# Patient Record
Sex: Female | Born: 1950 | Race: White | Hispanic: No | Marital: Married | Smoking: Never smoker
Health system: Southern US, Community
[De-identification: ages and names within clinical notes are randomized; demographics above are authoritative.]

## PROBLEM LIST (undated history)

## (undated) DIAGNOSIS — Z8489 Family history of other specified conditions: Secondary | ICD-10-CM

## (undated) DIAGNOSIS — F32A Depression, unspecified: Secondary | ICD-10-CM

## (undated) DIAGNOSIS — F329 Major depressive disorder, single episode, unspecified: Secondary | ICD-10-CM

## (undated) DIAGNOSIS — E039 Hypothyroidism, unspecified: Secondary | ICD-10-CM

## (undated) DIAGNOSIS — R0789 Other chest pain: Secondary | ICD-10-CM

## (undated) DIAGNOSIS — F319 Bipolar disorder, unspecified: Secondary | ICD-10-CM

## (undated) DIAGNOSIS — J45990 Exercise induced bronchospasm: Secondary | ICD-10-CM

## (undated) DIAGNOSIS — J189 Pneumonia, unspecified organism: Secondary | ICD-10-CM

## (undated) HISTORY — DX: Other chest pain: R07.89

## (undated) HISTORY — PX: ABDOMINAL HYSTERECTOMY: SHX81

## (undated) HISTORY — PX: BREAST LUMPECTOMY: SHX2

---

## 2000-02-09 ENCOUNTER — Encounter: Admission: RE | Admit: 2000-02-09 | Discharge: 2000-02-09 | Payer: Self-pay | Admitting: Family Medicine

## 2000-02-09 ENCOUNTER — Encounter: Payer: Self-pay | Admitting: Family Medicine

## 2000-02-19 ENCOUNTER — Ambulatory Visit (HOSPITAL_COMMUNITY): Admission: RE | Admit: 2000-02-19 | Discharge: 2000-02-19 | Payer: Self-pay | Admitting: *Deleted

## 2000-10-04 ENCOUNTER — Encounter: Admission: RE | Admit: 2000-10-04 | Discharge: 2000-10-04 | Payer: Self-pay | Admitting: Family Medicine

## 2000-10-25 ENCOUNTER — Encounter: Admission: RE | Admit: 2000-10-25 | Discharge: 2000-10-25 | Payer: Self-pay | Admitting: Family Medicine

## 2001-01-07 ENCOUNTER — Inpatient Hospital Stay (HOSPITAL_COMMUNITY): Admission: EM | Admit: 2001-01-07 | Discharge: 2001-01-10 | Payer: Self-pay | Admitting: Psychiatry

## 2001-07-21 ENCOUNTER — Encounter: Payer: Self-pay | Admitting: Family Medicine

## 2001-07-21 ENCOUNTER — Encounter: Admission: RE | Admit: 2001-07-21 | Discharge: 2001-07-21 | Payer: Self-pay | Admitting: Family Medicine

## 2001-08-05 ENCOUNTER — Encounter: Payer: Self-pay | Admitting: Emergency Medicine

## 2001-08-05 ENCOUNTER — Emergency Department (HOSPITAL_COMMUNITY): Admission: EM | Admit: 2001-08-05 | Discharge: 2001-08-05 | Payer: Self-pay | Admitting: Emergency Medicine

## 2002-02-07 ENCOUNTER — Emergency Department (HOSPITAL_COMMUNITY): Admission: EM | Admit: 2002-02-07 | Discharge: 2002-02-07 | Payer: Self-pay | Admitting: *Deleted

## 2002-10-26 ENCOUNTER — Encounter: Payer: Self-pay | Admitting: Family Medicine

## 2002-10-26 ENCOUNTER — Encounter: Admission: RE | Admit: 2002-10-26 | Discharge: 2002-10-26 | Payer: Self-pay | Admitting: Family Medicine

## 2003-04-02 ENCOUNTER — Other Ambulatory Visit (HOSPITAL_COMMUNITY): Admission: RE | Admit: 2003-04-02 | Discharge: 2003-04-11 | Payer: Self-pay | Admitting: Psychiatry

## 2003-04-25 ENCOUNTER — Other Ambulatory Visit (HOSPITAL_COMMUNITY): Admission: RE | Admit: 2003-04-25 | Discharge: 2003-04-27 | Payer: Self-pay | Admitting: Psychiatry

## 2003-11-08 ENCOUNTER — Encounter: Admission: RE | Admit: 2003-11-08 | Discharge: 2003-11-08 | Payer: Self-pay | Admitting: Family Medicine

## 2003-11-15 ENCOUNTER — Encounter: Admission: RE | Admit: 2003-11-15 | Discharge: 2003-11-15 | Payer: Self-pay | Admitting: Family Medicine

## 2004-06-02 ENCOUNTER — Encounter: Admission: RE | Admit: 2004-06-02 | Discharge: 2004-06-02 | Payer: Self-pay | Admitting: Family Medicine

## 2005-01-07 ENCOUNTER — Encounter: Admission: RE | Admit: 2005-01-07 | Discharge: 2005-01-07 | Payer: Self-pay | Admitting: Family Medicine

## 2005-12-09 ENCOUNTER — Encounter: Admission: RE | Admit: 2005-12-09 | Discharge: 2005-12-09 | Payer: Self-pay | Admitting: Family Medicine

## 2006-01-12 HISTORY — PX: BREAST EXCISIONAL BIOPSY: SUR124

## 2006-05-26 ENCOUNTER — Encounter: Admission: RE | Admit: 2006-05-26 | Discharge: 2006-05-26 | Payer: Self-pay | Admitting: Family Medicine

## 2006-05-28 ENCOUNTER — Encounter: Admission: RE | Admit: 2006-05-28 | Discharge: 2006-05-28 | Payer: Self-pay | Admitting: Family Medicine

## 2006-06-16 ENCOUNTER — Encounter: Admission: RE | Admit: 2006-06-16 | Discharge: 2006-06-16 | Payer: Self-pay | Admitting: Family Medicine

## 2006-07-15 ENCOUNTER — Ambulatory Visit (HOSPITAL_BASED_OUTPATIENT_CLINIC_OR_DEPARTMENT_OTHER): Admission: RE | Admit: 2006-07-15 | Discharge: 2006-07-15 | Payer: Self-pay | Admitting: General Surgery

## 2006-07-15 ENCOUNTER — Encounter (INDEPENDENT_AMBULATORY_CARE_PROVIDER_SITE_OTHER): Payer: Self-pay | Admitting: General Surgery

## 2006-07-15 ENCOUNTER — Encounter: Admission: RE | Admit: 2006-07-15 | Discharge: 2006-07-15 | Payer: Self-pay | Admitting: General Surgery

## 2007-06-01 ENCOUNTER — Encounter: Admission: RE | Admit: 2007-06-01 | Discharge: 2007-06-01 | Payer: Self-pay | Admitting: Family Medicine

## 2008-06-06 ENCOUNTER — Encounter: Admission: RE | Admit: 2008-06-06 | Discharge: 2008-06-06 | Payer: Self-pay | Admitting: Family Medicine

## 2009-06-26 ENCOUNTER — Encounter: Admission: RE | Admit: 2009-06-26 | Discharge: 2009-06-26 | Payer: Self-pay | Admitting: Family Medicine

## 2009-10-23 ENCOUNTER — Encounter: Admission: RE | Admit: 2009-10-23 | Discharge: 2009-10-23 | Payer: Self-pay | Admitting: Family Medicine

## 2010-05-27 NOTE — Op Note (Signed)
NAMEALIS, SAWCHUK                 ACCOUNT NO.:  [id number]   MEDICAL RECORD NO.:  [id number]          PATIENT TYPE:  AMB   LOCATION:  DSC                          FACILITY:  [facility]   PHYSICIAN:  Gabrielle Dare. Janee Morn, M.D.DATE OF BIRTH:  05/05/1950   DATE OF PROCEDURE:  07/15/2006  DATE OF DISCHARGE:                               OPERATIVE REPORT   PREOPERATIVE DIAGNOSIS:  Calcifications, left breast.   POSTOPERATIVE DIAGNOSIS:  Calcifications, left breast.   PROCEDURE:  Needle localized left breast biopsy.   SURGEON:  Gabrielle Dare. Janee Morn, M.D.   ANESTHESIA:  General with laryngeal mask airway   HISTORY OF PRESENT ILLNESS:  Ms. Bacorn is a 60 year old female who was  noted on mammogram to have some suspicious calcifications in her left  breast. Stereotactic biopsy was done on June 16, 2006.  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  Due to the concerning appearance and these path  report findings, however, a biopsy of the area was recommended.  The  patient [facility]   PROCEDURE IN DETAIL:  Informed consent was obtained. The patient's site  was marked.  She received intravenous antibiotics.  She was brought to  the operating room.  General anesthesia with laryngeal mask airway was  administered by the anesthesia staff. Her left breast was prepped and  draped in a sterile fashion.  A curved incision was made including the  needle after injection of 0.25% Marcaine with epinephrine.  Subcutaneous  tissues were dissected down.  We followed the path of the needle and  cored out this suspicious area of tissue. Bovie cautery was used to get  excellent hemostasis along the way. The area was circumferentially  dissected and removed. This area was taken down to the fascia.  The deep  margin of the specimen was then oriented for pathology and sent for  specimen  mammography.  The wound was copiously irrigated.  Meticulous  hemostasis was ensured.  No other palpable abnormalities were noted.  Specimen mammogram confirmed that the specimen contained the suspicious  area. The subcutaneous tissues were approximated with a running 3-0  Vicryl suture and the skin was closed with a  running 4-0 Monocryl subcuticular stitch.  Sponge, needle and instrument  counts were all correct.  Benzoin, Steri-Strips and sterile dressings  were applied.  The patient tolerated the procedure well without apparent  complication and was taken to recovery in stable condition.      Gabrielle Dare Janee Morn, M.D.  Electronically Signed     BET/MEDQ  D:  07/15/2006  T:  07/15/2006  Job:  [id number]   cc:   Marjory Lies, M.D.  Dr. Manson Passey

## 2010-05-30 NOTE — H&P (Signed)
[facility]  Patient:    Shelley Clarke, Shelley Clarke Visit Number: [id number] MRN: [id number]          Service Type: PSY Location: 300 0301 01 Attending Physician:  Jeanice Lim Dictated by:   Young Berry Scott, R.N. N.P. Admit Date:  01/07/2001 Discharge Date: 01/10/2001                     Psychiatric Admission Assessment  DATE OF ADMISSION:  January 07, 2001  DATE OF ASSESSMENT:  January 08, 2001, at 8:30 a.m.  PATIENT IDENTIFICATION:  This is a 60 year old Caucasian female who is a voluntary admission.  HISTORY OF PRESENT ILLNESS:  This patient was referred by her psychiatrist at his office after expressing some suicidal thoughts, thinking that she might want to overdose on medications without any specific intent.  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  She endorses feelings of hopelessness and helplessness and has been experiencing increasing irritability.  The patient states during the assessment, "Im really feeling like I dont want to live."  She does also endorse suicidal ideation for the past two days.  She has been on unemployment and her spouse encouraged her to take an old position that paid less money than her previous one which contributed to her depression.  Also, she feels that the holiday season has been hard on her and that she "feels worthless." The new job that she is going to take involves taking a pay cut down to one half of what she was previously making.  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  PAST PSYCHIATRIC HISTORY:  The patient is followed by Valinda Hoar, the nurse practitioner with [facility] and has been followed  there since October 2002.  She has no prior inpatient admissions.  This is her first psychiatric admission and her first [facility] admission.  She reports that she has taken in the past Zoloft, Prozac, and Effexor to treat her depression and most recently has been started on Lexapro.  She denies any history of true mania but admits that she does have considerable mood fluctuation.  SUBSTANCE ABUSE HISTORY:  The patients urine drug screen was negative and she denies any substance abuse.  PAST MEDICAL HISTORY:  The patients primary care Sherika Kubicki is [facility].  Medical problems include hypothyroidism, GERD, hyperlipidemia, mitral valve prolapse secondary to using fen-phen many years ago, and she has a history of Graves disease that was diagnosed in February 2002 for which she received treatment.  CURRENT MEDICATIONS: 1. Synthroid 100 mcg daily. 2. Lexapro 10 mg daily. 3. Klonopin 0.5 mg one half to one tablet on a p.r.n. basis; the patient    typically takes one half tablet in the morning and at night, according to    her report. 4. Depakote ER 500 mg p.o. q.h.s. 5. Protonix 40 mg q.d. p.r.n. for her GERD. 6. Tricor 160 mg p.o. q.h.s.  DRUG ALLERGIES:  ZYPREXA which causes swelling and BENADRYL which causes formication.  REVIEW OF SYSTEMS:  Essentially unremarkable.  She does report that she has rare palpitations with her mitral valve regurgitation secondary to her fen-phen but has not had any of these recently.  NEUROLOGIC: The patient does report some recent decreased concentration with mild impairment of memory that she just attributes to her depression.  She has had a history of occasional headaches but none recently.  She does have a history of hypothyroidism secondary to receiving radiation for her Graves disease.  The patient reports that he reflux does bother her if she does not take her medications.  She also suffers from occasional constipation.  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 basis when she has recurrent flareups.  PHYSICAL EXAMINATION:  GENERAL:  The patient is a generally healthy appearing Caucasian female who is in no acute distress at this time.  She is overweight but generally healthy in appearance.  VITAL SIGNS:  On admission to the unit, temperature 98.1, pulse 65, respirations 20, blood pressure 147/88.  The patient weighed 222 pounds at the time of admission.  We do not have a height on her but she appears to be approximately 5 feet 4 inches tall, possibly 5 feet 6 inches tall.  HEAD:  Normocephalic.  Grooming is appropriate.  SKIN:  Medium tone, no remarkable features.  EENT:  PERRLA.  No rhinorrhea.  Oropharynx: Unremarkable.  NECK:  Supple without thyromegaly and full range of motion.  CARDIOVASCULAR:  S1 and S2 heard, no murmurs appreciated.  LUNGS:  Clear to auscultation.  ABDOMEN:  Rounded, soft and quiet, no tenderness.  No masses noted.  GENITALIA:  Deferred.  BREAST:  Exam is deferred.  MUSCULOSKELETAL:   Gait is grossly normal.  Strength is 5/5 throughout.  No restrictions in range of motion are obvious.  NEUROLOGIC:  Cranial nerves II-XII are intact.  EOMs: Intact.  Romberg: Without findings.  No focal neurologic findings noted.  LABORATORY DATA:  Depakote and thyroid panels are currently pending.  CBC was within normal limits.  Electrolytes were within normal limits.  Glucose was mildly elevated at 119 though she denies any history of diabetes or glucose intolerance that she is aware of.  Lipase was 14, amylase 41.  BUN 9, creatinine 1.0.  Urine drug screen was negative.  No pregnancy test was done because the patient has a history of a hysterectomy in 1985.  SOCIAL HISTORY:  The patient is married and lives at home with her husband and son.  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 over the holiday.  FAMILY HISTORY:  Not available.  MENTAL STATUS EXAMINATION:  This is fully alert female who is full contact with reality.  She does have a tearful affect and spends the entire interview sobbing and crying constantly; however, she is cooperative and polite.  Affect is tearful.  Speech is normal and relevant.  Mood is depressed, hopeless, and helpless.  Thought process is logical and goal directed.  Although she does contemplate suicidal thoughts and still continues to entertain thoughts of possibly overdosing on medications and wishing that she could die, she is able to promise safety at this time on the unit; she is not able to promise safety in the community.  No evidence of homicidal ideation or any paranoia.  No evidence of guarding, no psychosis.  Cognitive: Intact and oriented x 4. Intelligence is above average.  Judgment is fair.  Insight: Fairly good. Impulse control: Within normal limits.  ADMISSION  DIAGNOSES: Axis I:    Rule out bipolar disorder currently depressed. Axis II:   Deferred. Axis III:  1. Mitral valve prolapse.            2. Hypothyroidism.            3. Gastroesophageal reflux disease.            4. Hyperlipidemia.            5. Status post Graves disease by history. Axis IV:   Moderate problems with decreased income and loss of self-esteem by            way of loss of previous job. Axis V:    Current 30, past year 46.  INITIAL PLAN OF CARE:  Plan is to voluntarily admit the patient to stabilize her mood with q.67m. checks.  Goal is to alleviate her suicidal ideation.  At this point, we will continue her previous routine medications and plan on adding Wellbutrin SR 100 mg p.o. today and her Depakote level is currently pending.  Meanwhile, we have discussed her strengths and weaknesses related to these medications and she is agreeable to  trying the Wellbutrin.  She has no history of seizure disorder.  We will continue her with her Lexapro 10 mg p.o. q.d. and we will start her on intensive group and individual psychotherapy and consider the possibility of a family session with her husband if she feels that would helpful after she makes some progress with therapy.  ESTIMATED LENGTH OF STAY:  Three days. Dictated by:   Young Berry Scott, R.N. N.P. Attending Physician:  Jeanice Lim DD:  03/23/01 TD:  03/25/01 Job: [id number]

## 2010-05-30 NOTE — Discharge Summary (Signed)
[facility]  Patient:    Shelley Clarke, Shelley Clarke Visit Number: [id number] MRN: [id number]          Service Type: PSY Location: 300 0301 01 Attending Physician:  Jeanice Lim Dictated by:   Jeanice Lim, M.D. Admit Date:  01/07/2001 Discharge Date: 01/10/2001                             Discharge Summary  IDENTIFYING DATA:  This is a 60 year old Caucasian female voluntarily admitted by her psychiatrist after expressing suicidal thoughts.  The patient has been followed up by Dr. Nolen Mu.  MEDICATIONS:  Synthroid, Lexapro 10 mg q.a.m., Klonopin 0.5 mg 1 tab t.i.d. p.r.n., Depakote ER 500 mg q.h.s., Protonix 40 mg p.r.n. and Tricor 160 mg q.h.s.  ALLERGIES:  ZYPREXA and BENADRYL.  PHYSICAL EXAMINATION:  Unremarkable.  Neurologically nonfocal.  LABORATORY DATA:  Routine admission labs were essentially within normal limits including a negative urinalysis, negative urine drug screen.  CBC with differential and comprehensive metabolic panel within normal limits except for a slightly elevated glucose at 119.  Amylase and lipase were within normal limits.  Thyroid panel within normal limits.  HOSPITAL COURSE:  The patient rapidly reported improvement after being admitted.  There is no indication to make medication changes.  She had been fired from a job secondary to temper tantrums.  Had a history of bipolar and dysthymic disorder and denied suicidal ideation after being admitted, reporting feeling less depressed and less overwhelmed after being hospitalized.  Synthroid, Lexapro, Klonopin, Depakote, Tricor, Protonix were all resumed.  The patient tolerated these medications well without side effects.  Wellbutrin SR 100 mg was started for adjunctive treatment of depressive symptoms.  Family was contacted as well as outpatient psychiatrist for aftercare planning as well as request to send a copy of the labs including liver function tests and valproic acid  level to Dr. Nolen Mu was also made.  CONDITION ON DISCHARGE:  Markedly improved.  Mood was euthymic.  Affect brighter.  Thought process goal directed.  Thought content negative for suicidal or homicidal ideation or psychotic symptoms.  Judgment and insight had improved.  The patient felt she could cope with being treated as an outpatient safely.  DISCHARGE MEDICATIONS: 1. Wellbutrin SR 100 mg q.a.m. 2. Levothyroxine 100 mcg q.a.m. 3. Lexapro 10 mg q.a.m. 4. Klonopin 0.5 mg, 1/2 b.i.d. and 1/2 q.h.s. p.r.n. insomnia and anxiety. 5. Depakote 500 mg q.h.s. 6. Tricor 160 mg q.h.s. 7. Protonix. 8. Ambien p.r.n. to take as previously prescribed.  DISCHARGE RECOMMENDATIONS:  The patient was recommended a heart-healthy diet.  FOLLOW-UP:  Again, a copy of the labs were requested to be sent to Dr. Nolen Mu and patient was scheduled for follow-up with Valinda Hoar on January 13, 2001 at 1 p.m.  DISCHARGE DIAGNOSES:  Same as admitting except Global Assessment of Functioning on discharge was 55. Dictated by:   Jeanice Lim, M.D. Attending Physician:  Jeanice Lim DD:  02/08/01 TD:  02/09/01 Job: [id number]

## 2010-07-15 ENCOUNTER — Other Ambulatory Visit: Payer: Self-pay | Admitting: Family Medicine

## 2010-07-15 DIAGNOSIS — Z1231 Encounter for screening mammogram for malignant neoplasm of breast: Secondary | ICD-10-CM

## 2010-07-23 ENCOUNTER — Ambulatory Visit: Payer: Self-pay

## 2010-07-30 ENCOUNTER — Ambulatory Visit
Admission: RE | Admit: 2010-07-30 | Discharge: 2010-07-30 | Disposition: A | Discharge status: Home / Self Care | Payer: 59 | Source: Ambulatory Visit | Attending: Family Medicine | Admitting: Family Medicine

## 2010-07-30 DIAGNOSIS — Z1231 Encounter for screening mammogram for malignant neoplasm of breast: Secondary | ICD-10-CM

## 2010-10-28 LAB — POCT HEMOGLOBIN-HEMACUE
Hemoglobin: 14.7
Operator id: 12362

## 2011-06-24 ENCOUNTER — Other Ambulatory Visit: Payer: Self-pay | Admitting: Family Medicine

## 2011-06-24 DIAGNOSIS — Z1231 Encounter for screening mammogram for malignant neoplasm of breast: Secondary | ICD-10-CM

## 2011-08-05 ENCOUNTER — Ambulatory Visit: Payer: 59

## 2011-08-19 ENCOUNTER — Ambulatory Visit
Admission: RE | Admit: 2011-08-19 | Discharge: 2011-08-19 | Disposition: A | Discharge status: Home / Self Care | Payer: 59 | Source: Ambulatory Visit | Attending: Family Medicine | Admitting: Family Medicine

## 2011-08-19 DIAGNOSIS — Z1231 Encounter for screening mammogram for malignant neoplasm of breast: Secondary | ICD-10-CM

## 2011-09-09 ENCOUNTER — Ambulatory Visit: Payer: 59

## 2012-08-29 ENCOUNTER — Other Ambulatory Visit: Payer: Self-pay

## 2012-08-29 DIAGNOSIS — Z1231 Encounter for screening mammogram for malignant neoplasm of breast: Secondary | ICD-10-CM

## 2012-09-19 ENCOUNTER — Ambulatory Visit: Payer: 59

## 2012-10-07 ENCOUNTER — Ambulatory Visit: Payer: 59

## 2012-10-28 ENCOUNTER — Ambulatory Visit
Admission: RE | Admit: 2012-10-28 | Discharge: 2012-10-28 | Disposition: A | Discharge status: Home / Self Care | Payer: 59 | Source: Ambulatory Visit

## 2012-10-28 DIAGNOSIS — Z1231 Encounter for screening mammogram for malignant neoplasm of breast: Secondary | ICD-10-CM

## 2013-10-16 ENCOUNTER — Other Ambulatory Visit: Payer: Self-pay

## 2013-10-16 DIAGNOSIS — Z1239 Encounter for other screening for malignant neoplasm of breast: Secondary | ICD-10-CM

## 2013-11-09 ENCOUNTER — Ambulatory Visit
Admission: RE | Admit: 2013-11-09 | Discharge: 2013-11-09 | Disposition: A | Discharge status: Home / Self Care | Payer: 59 | Source: Ambulatory Visit

## 2013-11-09 DIAGNOSIS — Z1239 Encounter for other screening for malignant neoplasm of breast: Secondary | ICD-10-CM

## 2014-10-31 ENCOUNTER — Other Ambulatory Visit: Payer: Self-pay

## 2014-10-31 DIAGNOSIS — Z1231 Encounter for screening mammogram for malignant neoplasm of breast: Secondary | ICD-10-CM

## 2014-12-12 ENCOUNTER — Ambulatory Visit
Admission: RE | Admit: 2014-12-12 | Discharge: 2014-12-12 | Disposition: A | Discharge status: Home / Self Care | Payer: 59 | Source: Ambulatory Visit

## 2014-12-12 DIAGNOSIS — Z1231 Encounter for screening mammogram for malignant neoplasm of breast: Secondary | ICD-10-CM

## 2015-04-15 DIAGNOSIS — E039 Hypothyroidism, unspecified: Secondary | ICD-10-CM | POA: Insufficient documentation

## 2015-04-15 DIAGNOSIS — F419 Anxiety disorder, unspecified: Secondary | ICD-10-CM | POA: Insufficient documentation

## 2015-04-15 DIAGNOSIS — F329 Major depressive disorder, single episode, unspecified: Secondary | ICD-10-CM | POA: Insufficient documentation

## 2015-04-15 DIAGNOSIS — F32A Depression, unspecified: Secondary | ICD-10-CM | POA: Insufficient documentation

## 2015-11-11 ENCOUNTER — Other Ambulatory Visit: Payer: Self-pay | Admitting: Family Medicine

## 2015-11-11 DIAGNOSIS — Z1231 Encounter for screening mammogram for malignant neoplasm of breast: Secondary | ICD-10-CM

## 2015-12-23 ENCOUNTER — Ambulatory Visit
Admission: RE | Admit: 2015-12-23 | Discharge: 2015-12-23 | Disposition: A | Discharge status: Home / Self Care | Payer: 59 | Source: Ambulatory Visit | Attending: Family Medicine | Admitting: Family Medicine

## 2015-12-23 DIAGNOSIS — Z1231 Encounter for screening mammogram for malignant neoplasm of breast: Secondary | ICD-10-CM

## 2016-04-20 ENCOUNTER — Other Ambulatory Visit: Payer: Self-pay | Admitting: Family Medicine

## 2016-04-20 DIAGNOSIS — E2839 Other primary ovarian failure: Secondary | ICD-10-CM

## 2016-05-04 ENCOUNTER — Ambulatory Visit
Admission: RE | Admit: 2016-05-04 | Discharge: 2016-05-04 | Disposition: A | Discharge status: Home / Self Care | Payer: 59 | Source: Ambulatory Visit | Attending: Family Medicine | Admitting: Family Medicine

## 2016-05-04 DIAGNOSIS — E2839 Other primary ovarian failure: Secondary | ICD-10-CM

## 2016-11-25 ENCOUNTER — Other Ambulatory Visit: Payer: Self-pay | Admitting: Family Medicine

## 2016-11-25 DIAGNOSIS — Z1231 Encounter for screening mammogram for malignant neoplasm of breast: Secondary | ICD-10-CM

## 2016-12-23 ENCOUNTER — Ambulatory Visit
Admission: RE | Admit: 2016-12-23 | Discharge: 2016-12-23 | Disposition: A | Discharge status: Home / Self Care | Payer: 59 | Source: Ambulatory Visit | Attending: Family Medicine | Admitting: Family Medicine

## 2016-12-23 DIAGNOSIS — Z1231 Encounter for screening mammogram for malignant neoplasm of breast: Secondary | ICD-10-CM

## 2017-09-24 ENCOUNTER — Other Ambulatory Visit: Payer: Self-pay | Admitting: Family Medicine

## 2017-09-24 DIAGNOSIS — R2689 Other abnormalities of gait and mobility: Secondary | ICD-10-CM

## 2017-09-24 DIAGNOSIS — R251 Tremor, unspecified: Secondary | ICD-10-CM

## 2017-09-24 DIAGNOSIS — R296 Repeated falls: Secondary | ICD-10-CM

## 2017-10-13 ENCOUNTER — Encounter: Payer: Self-pay | Admitting: Physician Assistant

## 2017-10-13 ENCOUNTER — Ambulatory Visit: Payer: 59 | Admitting: Physician Assistant

## 2017-10-13 DIAGNOSIS — F319 Bipolar disorder, unspecified: Secondary | ICD-10-CM | POA: Diagnosis not present

## 2017-10-13 DIAGNOSIS — F429 Obsessive-compulsive disorder, unspecified: Secondary | ICD-10-CM

## 2017-10-13 DIAGNOSIS — F411 Generalized anxiety disorder: Secondary | ICD-10-CM

## 2017-10-13 MED ORDER — FLUOXETINE HCL 40 MG PO CAPS: ORAL_CAPSULE | ORAL | 3 refills | Status: DC

## 2017-10-13 NOTE — Progress Notes (Signed)
[facility]  Patient ID: Shelley Clarke,  MRN: [id number]  PCP: Richmond Campbell., PA-C  Date of Evaluation: 10/13/2017 Time spent:15 minutes   HISTORY/CURRENT STATUS: HPI  "I'm doing really good right now!  Increasing the Prozac has helped a lot." It's been about 6 weeks now.  Able to enjoy things: cross-stitching and going to gym 6 days a week and loving that.  She's cooking a lot too.  Energy and motivation are good.  Not crying easily.  Sleeps fairly well.  "For the most part.  But I wake up about 4 a.m. But can go back to sleep."  Feels rested when she gets up.  No increased energy, impulsivity, risky behavior, irritability, increased spending or libido, or other manic sx. She reports having balance problems.  She has had work-up including MRI that was normal, through her PCP.  She will be starting physical therapy for that issue in a couple of weeks.  She denies tremor. She and her husband are going on vacation to [facility] next week and she is looking forward to that.  Individual Medical History/ Review of Systems: Changes? :No  Allergies: Olanzapine  Current Medications:  Current Outpatient Medications:  .  aspirin EC 81 MG tablet, Take 81 mg by mouth., Disp: , Rfl:  .  buPROPion (WELLBUTRIN XL) 150 MG 24 hr tablet, Take 150 mg by mouth., Disp: , Rfl:  .  buPROPion (WELLBUTRIN XL) 300 MG 24 hr tablet, Take 300 mg by mouth., Disp: , Rfl:  .  clonazePAM (KLONOPIN) 0.5 MG tablet, TAKE 1 TABLET BY MOUTH EVERY 6 HOURS AS NEEDED AND 2 TABLETS AT BEDTIME, Disp: , Rfl:  .  FLUoxetine (PROZAC) 40 MG capsule, 40 mg., Disp: , Rfl:  .  FLUoxetine (PROZAC) 40 MG capsule, Take 2 every morning, Disp: 180 capsule, Rfl: 3 .  lamoTRIgine (LAMICTAL) 150 MG tablet, 150 mg., Disp: , Rfl:  .  ziprasidone (GEODON) 40 MG capsule, TAKE 1 TABLET AT BREAKFAST AND 1 TABLET AT LUNCH, Disp: , Rfl:  .  ziprasidone (GEODON) 80 MG capsule, Take 80 mg by mouth., Disp: , Rfl:  Medication Side  Effects: None  Family Medical/ Social History: Changes? No  MENTAL HEALTH EXAM:  There were no vitals taken for this visit.There is no height or weight on file to calculate BMI.  General Appearance: Well Groomed  Eye Contact:  Good  Speech:  Clear and Coherent  Volume:  Normal  Mood:  Euthymic  Affect:  Appropriate  Thought Process:  Goal Directed  Orientation:  Full (Time, Place, and Person)  Thought Content: Logical   Suicidal Thoughts:  No  Homicidal Thoughts:  No  Memory:  Immediate  Judgement:  Good  Insight:  Good  Psychomotor Activity:  slightly leaning to the right.  Swings her arms normally.  No shuffling gate.  Concentration:  Concentration: Good  Recall:  Good  Fund of Knowledge: Good  Language: Good  Akathisia:  No  AIMS (if indicated): not done  Assets:  Communication Skills  ADL's:  Intact  Cognition: WNL  Prognosis:  Good    DIAGNOSES:    ICD-10-CM   1. Bipolar I disorder (HCC) F31.9   2. Generalized anxiety disorder F41.1   3. Obsessive-compulsive disorder, unspecified type F42.9     RECOMMENDATIONS: We discussed the imbalance issues.  She is aware that some of her medications may be causing the issue.  She is doing so well mentally that neither of [place] want to  change any medications.  We agreed to see how well she does with physical therapy.  If she does not improve then we may need to consider changing, or at least decreasing the doses of the mental health medications.   She will continue all medications as above.  Her new prescription for Prozac will be for 40 mg to daily instead of the 60+20 that she has been taking.  She understands the change in dose. Continue therapeutic lifestyle with exercising daily. Return in approximately 3 months or sooner as needed.    Melony Overly, PA-C

## 2017-11-02 ENCOUNTER — Emergency Department (HOSPITAL_COMMUNITY): Payer: Medicare Other

## 2017-11-02 ENCOUNTER — Inpatient Hospital Stay (HOSPITAL_COMMUNITY)
Admission: EM | Admit: 2017-11-02 | Discharge: 2017-11-05 | DRG: 193 | Disposition: A | Discharge status: Skilled Nursing Facility | Payer: Medicare Other | Attending: Internal Medicine | Admitting: Internal Medicine

## 2017-11-02 ENCOUNTER — Other Ambulatory Visit: Payer: Self-pay

## 2017-11-02 ENCOUNTER — Encounter (HOSPITAL_COMMUNITY): Payer: Self-pay

## 2017-11-02 DIAGNOSIS — Z7982 Long term (current) use of aspirin: Secondary | ICD-10-CM

## 2017-11-02 DIAGNOSIS — F329 Major depressive disorder, single episode, unspecified: Secondary | ICD-10-CM | POA: Diagnosis present

## 2017-11-02 DIAGNOSIS — F32A Depression, unspecified: Secondary | ICD-10-CM | POA: Diagnosis present

## 2017-11-02 DIAGNOSIS — J189 Pneumonia, unspecified organism: Secondary | ICD-10-CM | POA: Diagnosis not present

## 2017-11-02 DIAGNOSIS — N183 Chronic kidney disease, stage 3 unspecified: Secondary | ICD-10-CM | POA: Diagnosis present

## 2017-11-02 DIAGNOSIS — Z888 Allergy status to other drugs, medicaments and biological substances status: Secondary | ICD-10-CM | POA: Diagnosis not present

## 2017-11-02 DIAGNOSIS — J101 Influenza due to other identified influenza virus with other respiratory manifestations: Secondary | ICD-10-CM | POA: Diagnosis present

## 2017-11-02 DIAGNOSIS — F05 Delirium due to known physiological condition: Secondary | ICD-10-CM | POA: Diagnosis present

## 2017-11-02 DIAGNOSIS — F319 Bipolar disorder, unspecified: Secondary | ICD-10-CM | POA: Diagnosis not present

## 2017-11-02 DIAGNOSIS — H109 Unspecified conjunctivitis: Secondary | ICD-10-CM | POA: Diagnosis present

## 2017-11-02 DIAGNOSIS — E039 Hypothyroidism, unspecified: Secondary | ICD-10-CM | POA: Diagnosis not present

## 2017-11-02 DIAGNOSIS — F419 Anxiety disorder, unspecified: Secondary | ICD-10-CM | POA: Diagnosis not present

## 2017-11-02 DIAGNOSIS — Z79899 Other long term (current) drug therapy: Secondary | ICD-10-CM | POA: Diagnosis not present

## 2017-11-02 DIAGNOSIS — J1000 Influenza due to other identified influenza virus with unspecified type of pneumonia: Secondary | ICD-10-CM | POA: Diagnosis not present

## 2017-11-02 DIAGNOSIS — Z66 Do not resuscitate: Secondary | ICD-10-CM | POA: Diagnosis present

## 2017-11-02 DIAGNOSIS — J9601 Acute respiratory failure with hypoxia: Secondary | ICD-10-CM | POA: Diagnosis present

## 2017-11-02 DIAGNOSIS — Z79890 Hormone replacement therapy (postmenopausal): Secondary | ICD-10-CM

## 2017-11-02 DIAGNOSIS — J181 Lobar pneumonia, unspecified organism: Secondary | ICD-10-CM | POA: Diagnosis not present

## 2017-11-02 HISTORY — DX: Major depressive disorder, single episode, unspecified: F32.9

## 2017-11-02 HISTORY — DX: Depression, unspecified: F32.A

## 2017-11-02 HISTORY — DX: Hypothyroidism, unspecified: E03.9

## 2017-11-02 HISTORY — DX: Bipolar disorder, unspecified: F31.9

## 2017-11-02 LAB — CBC
HCT: 35.8 % — ABNORMAL LOW (ref 36.0–46.0)
Hemoglobin: 11.6 g/dL — ABNORMAL LOW (ref 12.0–15.0)
MCH: 31.8 pg (ref 26.0–34.0)
MCHC: 32.4 g/dL (ref 30.0–36.0)
MCV: 98.1 fL (ref 80.0–100.0)
PLATELETS: 314 10*3/uL (ref 150–400)
RBC: 3.65 MIL/uL — ABNORMAL LOW (ref 3.87–5.11)
RDW: 13.2 % (ref 11.5–15.5)
WBC: 12.2 10*3/uL — ABNORMAL HIGH (ref 4.0–10.5)
nRBC: 0 % (ref 0.0–0.2)

## 2017-11-02 LAB — CBC WITH DIFFERENTIAL/PLATELET
Abs Immature Granulocytes: 0.48 K/uL — ABNORMAL HIGH (ref 0.00–0.07)
Basophils Absolute: 0.1 K/uL (ref 0.0–0.1)
Basophils Relative: 1 %
Eosinophils Absolute: 0.2 K/uL (ref 0.0–0.5)
Eosinophils Relative: 1 %
HCT: 36 % (ref 36.0–46.0)
Hemoglobin: 11.8 g/dL — ABNORMAL LOW (ref 12.0–15.0)
Immature Granulocytes: 4 %
Lymphocytes Relative: 10 %
Lymphs Abs: 1.2 K/uL (ref 0.7–4.0)
MCH: 31.8 pg (ref 26.0–34.0)
MCHC: 32.8 g/dL (ref 30.0–36.0)
MCV: 97 fL (ref 80.0–100.0)
Monocytes Absolute: 1.3 K/uL — ABNORMAL HIGH (ref 0.1–1.0)
Monocytes Relative: 11 %
Neutro Abs: 8.4 K/uL — ABNORMAL HIGH (ref 1.7–7.7)
Neutrophils Relative %: 73 %
Platelets: 300 K/uL (ref 150–400)
RBC: 3.71 MIL/uL — ABNORMAL LOW (ref 3.87–5.11)
RDW: 13.1 % (ref 11.5–15.5)
WBC: 11.6 K/uL — ABNORMAL HIGH (ref 4.0–10.5)
nRBC: 0 % (ref 0.0–0.2)

## 2017-11-02 LAB — BASIC METABOLIC PANEL
Anion gap: 9 (ref 5–15)
BUN: 16 mg/dL (ref 8–23)
CHLORIDE: 102 mmol/L (ref 98–111)
CO2: 28 mmol/L (ref 22–32)
CREATININE: 1.07 mg/dL — AB (ref 0.44–1.00)
Calcium: 9.1 mg/dL (ref 8.9–10.3)
GFR, EST NON AFRICAN AMERICAN: 53 mL/min — AB (ref >60)
Glucose, Bld: 107 mg/dL — ABNORMAL HIGH (ref 70–99)
POTASSIUM: 4.6 mmol/L (ref 3.5–5.1)
SODIUM: 139 mmol/L (ref 135–145)

## 2017-11-02 LAB — CREATININE, SERUM
CREATININE: 1.12 mg/dL — AB (ref 0.44–1.00)
GFR calc Af Amer: 58 mL/min — ABNORMAL LOW (ref >60)
GFR calc non Af Amer: 50 mL/min — ABNORMAL LOW (ref >60)

## 2017-11-02 LAB — TSH: TSH: 3.097 u[IU]/mL (ref 0.350–4.500)

## 2017-11-02 LAB — I-STAT CG4 LACTIC ACID, ED: Lactic Acid, Venous: 0.96 mmol/L (ref 0.5–1.9)

## 2017-11-02 MED ORDER — ONDANSETRON HCL 4 MG PO TABS: 4.0000 mg | ORAL_TABLET | Freq: Four times a day (QID) | ORAL | Status: DC | PRN

## 2017-11-02 MED ORDER — HYDROCODONE-HOMATROPINE 5-1.5 MG/5ML PO SYRP
5.0000 mL | ORAL_SOLUTION | Freq: Two times a day (BID) | ORAL | Status: DC
  Administered 2017-11-02 – 2017-11-05 (×6): 5 mL via ORAL
  Filled 2017-11-02 (×6): qty 5

## 2017-11-02 MED ORDER — SODIUM CHLORIDE 0.9 % IV SOLN
1.0000 g | INTRAVENOUS | Status: DC
  Administered 2017-11-03 – 2017-11-04 (×2): 1 g via INTRAVENOUS
  Filled 2017-11-02: qty 1
  Filled 2017-11-02: qty 10
  Filled 2017-11-02: qty 1

## 2017-11-02 MED ORDER — ACETAMINOPHEN 325 MG PO TABS: 650.0000 mg | ORAL_TABLET | Freq: Four times a day (QID) | ORAL | Status: DC | PRN

## 2017-11-02 MED ORDER — ALBUTEROL SULFATE (2.5 MG/3ML) 0.083% IN NEBU
2.5000 mg | INHALATION_SOLUTION | Freq: Two times a day (BID) | RESPIRATORY_TRACT | Status: DC
  Administered 2017-11-03 – 2017-11-05 (×5): 2.5 mg via RESPIRATORY_TRACT
  Filled 2017-11-02 (×5): qty 3

## 2017-11-02 MED ORDER — SODIUM CHLORIDE 0.9 % IV SOLN
500.0000 mg | Freq: Once | INTRAVENOUS | Status: AC
  Administered 2017-11-02: 500 mg via INTRAVENOUS
  Filled 2017-11-02: qty 500

## 2017-11-02 MED ORDER — LAMOTRIGINE 100 MG PO TABS
300.0000 mg | ORAL_TABLET | Freq: Every day | ORAL | Status: DC
  Administered 2017-11-02 – 2017-11-04 (×3): 300 mg via ORAL
  Filled 2017-11-02 (×3): qty 3

## 2017-11-02 MED ORDER — BENZONATATE 100 MG PO CAPS
100.0000 mg | ORAL_CAPSULE | Freq: Two times a day (BID) | ORAL | Status: DC
  Administered 2017-11-02 – 2017-11-05 (×6): 100 mg via ORAL
  Filled 2017-11-02 (×6): qty 1

## 2017-11-02 MED ORDER — CLONAZEPAM 0.5 MG PO TABS
0.5000 mg | ORAL_TABLET | Freq: Two times a day (BID) | ORAL | Status: DC
  Administered 2017-11-03 – 2017-11-05 (×6): 0.5 mg via ORAL
  Filled 2017-11-02 (×6): qty 1

## 2017-11-02 MED ORDER — BUPROPION HCL ER (XL) 300 MG PO TB24
300.0000 mg | ORAL_TABLET | Freq: Every day | ORAL | Status: DC
  Administered 2017-11-03 – 2017-11-05 (×3): 300 mg via ORAL
  Filled 2017-11-02 (×3): qty 1

## 2017-11-02 MED ORDER — SODIUM CHLORIDE 0.9 % IV SOLN
1.0000 g | Freq: Once | INTRAVENOUS | Status: AC
  Administered 2017-11-02: 1 g via INTRAVENOUS
  Filled 2017-11-02: qty 10

## 2017-11-02 MED ORDER — SODIUM CHLORIDE 0.9 % IV SOLN
INTRAVENOUS | Status: DC
  Administered 2017-11-02 – 2017-11-03 (×4): via INTRAVENOUS

## 2017-11-02 MED ORDER — VALACYCLOVIR HCL 500 MG PO TABS
500.0000 mg | ORAL_TABLET | Freq: Every day | ORAL | Status: DC
  Administered 2017-11-03 – 2017-11-05 (×3): 500 mg via ORAL
  Filled 2017-11-02 (×3): qty 1

## 2017-11-02 MED ORDER — SODIUM CHLORIDE 0.9 % IV SOLN
500.0000 mg | INTRAVENOUS | Status: DC
  Administered 2017-11-03: 500 mg via INTRAVENOUS
  Filled 2017-11-02: qty 500

## 2017-11-02 MED ORDER — LEVOTHYROXINE SODIUM 100 MCG PO TABS
100.0000 ug | ORAL_TABLET | ORAL | Status: DC
  Administered 2017-11-03 – 2017-11-05 (×3): 100 ug via ORAL
  Filled 2017-11-02 (×3): qty 1

## 2017-11-02 MED ORDER — SODIUM CHLORIDE 0.9 % IV BOLUS
1000.0000 mL | Freq: Once | INTRAVENOUS | Status: AC
  Administered 2017-11-02: 1000 mL via INTRAVENOUS

## 2017-11-02 MED ORDER — HEPARIN SODIUM (PORCINE) 5000 UNIT/ML IJ SOLN
5000.0000 [IU] | Freq: Three times a day (TID) | INTRAMUSCULAR | Status: DC
  Administered 2017-11-02 – 2017-11-05 (×8): 5000 [IU] via SUBCUTANEOUS
  Filled 2017-11-02 (×8): qty 1

## 2017-11-02 MED ORDER — SENNOSIDES-DOCUSATE SODIUM 8.6-50 MG PO TABS: 1.0000 | ORAL_TABLET | Freq: Every evening | ORAL | Status: DC | PRN

## 2017-11-02 MED ORDER — CLONAZEPAM 1 MG PO TABS
1.0000 mg | ORAL_TABLET | Freq: Every day | ORAL | Status: DC
  Administered 2017-11-02 – 2017-11-04 (×3): 1 mg via ORAL
  Filled 2017-11-02 (×3): qty 1

## 2017-11-02 MED ORDER — ONDANSETRON HCL 4 MG/2ML IJ SOLN: 4.0000 mg | Freq: Four times a day (QID) | INTRAMUSCULAR | Status: DC | PRN

## 2017-11-02 MED ORDER — TRAMADOL HCL 50 MG PO TABS: 50.0000 mg | ORAL_TABLET | Freq: Four times a day (QID) | ORAL | Status: DC | PRN

## 2017-11-02 MED ORDER — LEVOTHYROXINE SODIUM 112 MCG PO TABS: 112.0000 ug | ORAL_TABLET | ORAL | Status: DC

## 2017-11-02 MED ORDER — ALBUTEROL SULFATE (2.5 MG/3ML) 0.083% IN NEBU: 2.5000 mg | INHALATION_SOLUTION | Freq: Four times a day (QID) | RESPIRATORY_TRACT | Status: DC

## 2017-11-02 MED ORDER — ZIPRASIDONE HCL 80 MG PO CAPS
80.0000 mg | ORAL_CAPSULE | Freq: Every day | ORAL | Status: DC
  Administered 2017-11-02 – 2017-11-04 (×3): 80 mg via ORAL
  Filled 2017-11-02 (×3): qty 1

## 2017-11-02 MED ORDER — ALBUTEROL SULFATE (2.5 MG/3ML) 0.083% IN NEBU: 3.0000 mL | INHALATION_SOLUTION | Freq: Two times a day (BID) | RESPIRATORY_TRACT | Status: DC

## 2017-11-02 MED ORDER — GUAIFENESIN ER 600 MG PO TB12
600.0000 mg | ORAL_TABLET | Freq: Two times a day (BID) | ORAL | Status: DC
  Administered 2017-11-02 – 2017-11-05 (×6): 600 mg via ORAL
  Filled 2017-11-02 (×6): qty 1

## 2017-11-02 MED ORDER — ASPIRIN EC 81 MG PO TBEC
81.0000 mg | DELAYED_RELEASE_TABLET | Freq: Every day | ORAL | Status: DC
  Administered 2017-11-03 – 2017-11-05 (×3): 81 mg via ORAL
  Filled 2017-11-02 (×3): qty 1

## 2017-11-02 MED ORDER — POLYMYXIN B-TRIMETHOPRIM 10000-0.1 UNIT/ML-% OP SOLN
1.0000 [drp] | Freq: Four times a day (QID) | OPHTHALMIC | Status: DC
  Administered 2017-11-02 – 2017-11-05 (×11): 1 [drp] via OPHTHALMIC
  Filled 2017-11-02: qty 10

## 2017-11-02 MED ORDER — ALBUTEROL SULFATE (2.5 MG/3ML) 0.083% IN NEBU
2.5000 mg | INHALATION_SOLUTION | Freq: Four times a day (QID) | RESPIRATORY_TRACT | Status: DC
  Administered 2017-11-02: 2.5 mg via RESPIRATORY_TRACT
  Filled 2017-11-02: qty 3

## 2017-11-02 NOTE — ED Triage Notes (Signed)
Patient arrived via [facility] from home. Patient has been sick for a few days. Patient was diagnosed with flu on the 15th. Patient no able to get tamilflu due to being out of the 24-48 hr range. Patient put on a an antibiotics for sinus infection and to treat the symptoms. Patient c/o of cough that has worsened. Husband is on the way.

## 2017-11-02 NOTE — H&P (Addendum)
History and Physical  Shelley Clarke:[id number] DOB: 1950-12-27 DOA: 11/02/2017  PCP: Richmond Campbell., PA-C  Patient coming from:Home  Chief Complaint: worsening cough and feeling poorly   HPI: Shelley Clarke is a 67 y.o. female with medical history significant for Hypothyroidism, anxiety, Bipolar disorder, depression, HLD,  who presents on 11/02/2017 with one week of worsening cough and generalized malaise.  Her daughter had similar symptoms during a recent family vacation.  She saw her PCP on 10/28/17 for productive cough, nasal congestion, sinus pressure and headache was found to have influenza A at that time.  She was not given Tamiflu as she was out of the time window but was treated with Tessalon Perles, Mucinex nasal spray.  Patient continued to have worsening cough, diminished appetite, generalized malaise and was seen in another clinic on 10/19 where she was started on doxycycline.  She has been taking the medication still with very little improvement which prompted her arrival to ED today.  She denies any fevers, chills, nausea, vomiting, chest pain, abdominal pain, diarrhea, urinary frequency or dysuria.  Husband reports her expressing visual hallucinations as well during this time period as well.    ED Course: T max of 99.5 with SPO2 ranging from 89%-95% on 2 L and stable blood pressure. Labwork significant for WBC of 11.6 and hgb of 11.8 and normal lactic acid. CXR showed patch consolodation in bilateral upper lobes consistent with likely pneumonia  Review of Systems:As mentioned in the history of present illness.Review of systems are otherwise negative Patient seen in the ED.   Past Medical History:  Diagnosis Date  . Bipolar disorder (HCC)   . Depression   . Hypothyroidism    History reviewed. No pertinent surgical history. Allergies  Allergen Reactions  . Olanzapine Other (See Comments)    Swelling of the legs   Social History:  reports that she has never smoked.  She has never used smokeless tobacco. Her alcohol and drug histories are not on file. Family History  Problem Relation Age of Onset  . Breast cancer Mother 67  . Hypertension Mother   . Diabetes Mother   . Graves' disease Sister      Prior to Admission medications   Medication Sig Start Date End Date Taking? Authorizing Provider  albuterol (PROVENTIL HFA;VENTOLIN HFA) 108 (90 Base) MCG/ACT inhaler Inhale 2 puffs into the lungs 2 (two) times daily.   Yes [provider]  aspirin EC 81 MG tablet Take 81 mg by mouth.   Yes [provider]  Biotin 1000 MCG tablet Take 1,000 mcg by mouth daily.   Yes [provider]  buPROPion (WELLBUTRIN XL) 300 MG 24 hr tablet Take 300 mg by mouth daily.    Yes [provider]  clonazePAM (KLONOPIN) 0.5 MG tablet Take 0.5 mg by mouth as directed. 1 in the morning, 1 during the day and 2 at bedtime 02/11/15  Yes [provider]  doxycycline (VIBRA-TABS) 100 MG tablet Take 100 mg by mouth 2 (two) times daily. 10/30/17  Yes [provider]  FLUoxetine (PROZAC) 20 MG tablet Take 20 mg by mouth daily. With 40 MG   Yes [provider]  FLUoxetine (PROZAC) 40 MG capsule Take 2 every morning 10/13/17  Yes Hurst, Teresa T, PA-C  HYDROcodone-homatropine (HYCODAN) 5-1.5 MG/5ML syrup Take 5 mLs by mouth 2 (two) times daily. 10/30/17  Yes [provider]  lamoTRIgine (LAMICTAL) 150 MG tablet Take 300 mg by mouth at bedtime.  04/03/15  Yes [provider]  levothyroxine (SYNTHROID, LEVOTHROID) 100 MCG tablet Take 100 mcg by mouth daily before breakfast. Except saturdays   Yes [provider]  levothyroxine (SYNTHROID, LEVOTHROID) 112 MCG tablet Take 112 mcg by mouth as directed. On saturdays   Yes [provider]  meclizine (ANTIVERT) 12.5 MG tablet Take 12.5 mg by mouth 3 (three) times daily as needed for dizziness.   Yes [provider]  Thiamine HCl (VITAMIN B1 PO) Take  1 tablet by mouth daily.   Yes [provider]  trimethoprim-polymyxin b (POLYTRIM) ophthalmic solution Place 1 drop into both eyes 4 (four) times daily. 10/30/17  Yes [provider]  valACYclovir (VALTREX) 500 MG tablet Take 500 mg by mouth daily.   Yes [provider]  ziprasidone (GEODON) 40 MG capsule Take 80 mg by mouth at bedtime.  03/27/15  Yes [provider]    Physical Exam: BP (!) 141/67   Pulse 71   Temp 98.5 F (36.9 C) (Oral)   Resp 18   SpO2 94%   Constitutional ill appearing elderly female,  no distress, Eyes: EOMI, anicteric, some slight redness in conjunctivae ENMT: Oropharynx with dry mucous membranes, normal dentition Cardiovascular: RRR no MRGs, with no peripheral edema Respiratory: Normal respiratory effort on 2 L [place], diminished breath sounds, rales at bases, intermittent end-expiratory wheezing  Abdomen: Soft,non-tender, normal bowel sounds Skin: No rash ulcers, or lesions. Without skin tenting  Neurologic: Grossly no focal neuro deficit. Psychiatric:Appropriate affect, and mood. Mental status AAOx3          Labs on Admission:  Basic Metabolic Panel: Recent Labs  Lab 11/02/17 1230  NA 139  K 4.6  CL 102  CO2 28  GLUCOSE 107*  BUN 16  CREATININE 1.07*  CALCIUM 9.1   Liver Function Tests: No results for input(s): AST, ALT, ALKPHOS, BILITOT, PROT, ALBUMIN in the last 168 hours. No results for input(s): LIPASE, AMYLASE in the last 168 hours. No results for input(s): AMMONIA in the last 168 hours. CBC: Recent Labs  Lab 11/02/17 1230  WBC 11.6*  NEUTROABS 8.4*  HGB 11.8*  HCT 36.0  MCV 97.0  PLT 300   Cardiac Enzymes: No results for input(s): CKTOTAL, CKMB, CKMBINDEX, TROPONINI in the last 168 hours.  BNP (last 3 results) No results for input(s): BNP in the last 8760 hours.  ProBNP (last 3 results) No results for input(s): PROBNP in the last 8760 hours.  CBG: No results for input(s): GLUCAP in the  last 168 hours.  Radiological Exams on Admission: Dg Chest 2 View  Result Date: 11/02/2017 CLINICAL DATA:  67 y/o F; positive flu test. Shortness of breath and weakness. EXAM: CHEST - 2 VIEW COMPARISON:  None. FINDINGS: Normal cardiac silhouette. Patchy consolidations in the upper lobes bilaterally. Diffuse increased reticular opacities of the lungs. No pleural effusion or pneumothorax. No acute osseous abnormality is evident. IMPRESSION: Patchy consolidations in the upper lobes bilaterally, likely pneumonia. Electronically Signed   By: Mitzi Hansen M.D.   On: 11/02/2017 14:37    EKG: NO EKG obtained  Assessment/Plan Present on Admission: . CAP (community acquired pneumonia) . Influenza A . Acute respiratory failure with hypoxia (HCC)  Active Problems:   CAP (community acquired pneumonia)   Influenza A   Acute respiratory failure with hypoxia (HCC)   Acute hypoxic respiratory failure secondary to community-acquired pneumonia with influenza A.  On 2 L with SPO2 of 89%, no sepsis physiology, chest x-ray shows upper lobe opacities concerning  for pneumonia. Timeline concerning for potential superimposed S. Aureus infection especially considering recent influenza A diagnosis and failed outpatient doxycycline treatment, Obtain sputum culture. Also obtain strep pneumo.  Monitor vitals, currently hemodynamically stable.  His inspiratory, flutter valve, scheduled Mucinex and Tessalon Perles, albuterol nebs every 6 hours for 4 doses.    Conjunctivitis?  Was started on Polytrim eyedrops from last outpatient evaluation on 10/19.  We will continue and monitor closely  Bipolar Disorder, stable. Continue home Wellbutrin, Geodon, Klonopin, Lamictal, Prozac.  Has been and patient reports some visual hallucinations suspect related to pneumonia but will monitor to make sure it doesn't persist.  Hypothyroidism, stable.  Obtain TSH.  Synthroid 100 mmc daily for 6 days, 112 mcg every  Saturday  Depression, stable - Continue home meds.  CKD, Stage 3. Baseline creatinine 1.26. 1.07 on admission. Continue to monitor   DVT prophylaxis: Heparin subcut  Code Status: DNR, discussed on day of admission   Family Communication: Husband updated at bedside  Disposition Plan: IV ceftriazone and azithromycin to continue    Consults called: none   Admission status: Admitted as inpatient to [facility] unit as I expect given the severity of her pneumonia in failing outpatient doxycycline and concern for possible staph aureus infection given pneumonia after recent diagnosis of influenza A she will need at least 2 midnights. Needs to ensure no oxygen requirments and improvement on IV antibiotics.      Laverna Peace MD [facility]  Pager [phone]  If 7PM-7AM, please contact night-coverage [email or web address] Password [facility]  11/02/2017, 5:57 PM

## 2017-11-02 NOTE — ED Notes (Signed)
Report given to Calvin, RN.

## 2017-11-02 NOTE — ED Provider Notes (Signed)
Medical screening examination/treatment/procedure(s) were conducted as a shared visit with non-physician practitioner(s) and myself.  I personally evaluated the patient during the encounter.  None 67 year old female presents with malaise times several days.  Diagnosed with influenza on the 15th.  X-ray shows bilateral pneumonia.  Will be admitted to the hospital   Lorre Nick, MD 11/02/17 1511

## 2017-11-02 NOTE — Progress Notes (Signed)
BP= 141/67. Patient has history of anxiety, depression; was transferred from ED. Patient is tearful when arrived in room. RN will give medication as MD order and recheck BP in 30 minute.

## 2017-11-02 NOTE — Progress Notes (Signed)
Reviewed scheduled and prn medications with patient and patient's spouse. Per patient and spouse there are multiple discrepancies. Will call pharmacy and request a Pharmacy Tech come by to review medication list with patient and patient's husband to ensure accuracy.

## 2017-11-02 NOTE — Progress Notes (Signed)
Patient was transferred from ED at 1730. Alert and oriented x 4. Family member at bedside.

## 2017-11-02 NOTE — Progress Notes (Signed)
Pharmacy Tech at bedside to verify patient's home medication list.

## 2017-11-02 NOTE — ED Notes (Signed)
Attempted to give report. RN will call back due to getting another patient admission.

## 2017-11-02 NOTE — ED Provider Notes (Signed)
[facility]-EMERGENCY DEPT Provider Note   CSN: [id number] Arrival date & time: 11/02/17  1112     History   Chief Complaint Chief Complaint  Patient presents with  . Influenza    HPI Shelley Clarke is a 67 y.o. female who presents for evaluation of cough, generalized malaise, SOB.  Patient reports she was recently diagnosed with the flu on 10/26/2017.  She states that by that time, she had been having symptoms for more than 2 days and was not prescribed any Tamiflu.  She was however prescribed antibiotics for sinus infection.  Patient states that since 10/30/2017, she has been taking doxycycline.  Patient comes into the emergency department today because she reports worsening cough.  She states that she is felt fatigued, generalized malaise and myalgias.  She states that the cough feels like it should be productive but she is unable to get anything up.  Patient reports that sometimes she will cough and get into a fit which makes it hard to breathe.  She is currently not on any oxygen at home.  She has not noted any fever at home.  She has had some decreased appetite but denies any nausea/vomiting.  Patient denies any chest pain, abdominal pain.    The history is provided by the patient.    History reviewed. No pertinent past medical history.  Patient Active Problem List   Diagnosis Date Noted  . Anxiety 04/15/2015  . Depression 04/15/2015  . Hypothyroidism 04/15/2015    History reviewed. No pertinent surgical history.   OB History   None      Home Medications    Prior to Admission medications   Medication Sig Start Date End Date Taking? Authorizing Provider  albuterol (PROVENTIL HFA;VENTOLIN HFA) 108 (90 Base) MCG/ACT inhaler Inhale 2 puffs into the lungs 2 (two) times daily.   Yes [provider]  aspirin EC 81 MG tablet Take 81 mg by mouth.   Yes [provider]  Biotin 1000 MCG tablet Take 1,000 mcg by mouth daily.   Yes [provider]  buPROPion (WELLBUTRIN XL) 300 MG 24 hr tablet Take 300 mg by mouth daily.    Yes [provider]  clonazePAM (KLONOPIN) 0.5 MG tablet Take 0.5 mg by mouth as directed. 1 in the morning, 1 during the day and 2 at bedtime 02/11/15  Yes [provider]  doxycycline (VIBRA-TABS) 100 MG tablet Take 100 mg by mouth 2 (two) times daily. 10/30/17  Yes [provider]  FLUoxetine (PROZAC) 20 MG tablet Take 20 mg by mouth daily. With 40 MG   Yes [provider]  FLUoxetine (PROZAC) 40 MG capsule Take 2 every morning 10/13/17  Yes Hurst, Teresa T, PA-C  HYDROcodone-homatropine (HYCODAN) 5-1.5 MG/5ML syrup Take 5 mLs by mouth 2 (two) times daily. 10/30/17  Yes [provider]  lamoTRIgine (LAMICTAL) 150 MG tablet Take 300 mg by mouth at bedtime.  04/03/15  Yes [provider]  levothyroxine (SYNTHROID, LEVOTHROID) 100 MCG tablet Take 100 mcg by mouth daily before breakfast. Except saturdays   Yes [provider]  levothyroxine (SYNTHROID, LEVOTHROID) 112 MCG tablet Take 112 mcg by mouth as directed. On saturdays   Yes [provider]  meclizine (ANTIVERT) 12.5 MG tablet Take 12.5 mg by mouth 3 (three) times daily as needed for dizziness.   Yes [provider]  Thiamine HCl (VITAMIN B1 PO) Take 1 tablet by mouth daily.   Yes [provider]  trimethoprim-polymyxin b (POLYTRIM) ophthalmic solution Place 1 drop into both eyes 4 (four) times daily. 10/30/17  Yes [provider]  valACYclovir (VALTREX) 500 MG tablet Take 500 mg by mouth daily.   Yes [provider]  ziprasidone (GEODON) 40 MG capsule Take 80 mg by mouth at bedtime.  03/27/15  Yes [provider]    Family History Family History  Problem Relation Age of Onset  . Breast cancer Mother 74    Social History Social History   Tobacco Use  . Smoking status: Never Smoker  . Smokeless tobacco: Never Used  Substance Use  Topics  . Alcohol use: Not on file  . Drug use: Not on file     Allergies   Olanzapine   Review of Systems Review of Systems  Constitutional: Positive for appetite change. Negative for fever.  Respiratory: Positive for cough. Negative for shortness of breath.   Cardiovascular: Negative for chest pain.  Gastrointestinal: Negative for abdominal pain, nausea and vomiting.  Genitourinary: Negative for dysuria and hematuria.  Musculoskeletal: Positive for myalgias.  Neurological: Negative for headaches.  All other systems reviewed and are negative.    Physical Exam Updated Vital Signs BP 112/61   Pulse 61   Temp 99.5 F (37.5 C) (Rectal)   Resp 13   SpO2 96%   Physical Exam  Constitutional: She is oriented to person, place, and time. She appears well-developed and well-nourished.  HENT:  Head: Normocephalic and atraumatic.  Mouth/Throat: Oropharynx is clear and moist and mucous membranes are normal.  Eyes: Pupils are equal, round, and reactive to light. Conjunctivae, EOM and lids are normal.  Neck: Full passive range of motion without pain.  Cardiovascular: Normal rate, regular rhythm, normal heart sounds and normal pulses. Exam reveals no gallop and no friction rub.  No murmur heard. Pulmonary/Chest: Effort normal. She has rales.  Rales noted to the upper lung fields, more significantly heard on the right.  Speaking in short sentences.   Abdominal: Soft. Normal appearance. There is no tenderness. There is no rigidity and no guarding.  Musculoskeletal: Normal range of motion.  Neurological: She is alert and oriented to person, place, and time.  Skin: Skin is warm and dry. Capillary refill takes less than 2 seconds.  Psychiatric: She has a normal mood and affect. Her speech is normal.  Nursing note and vitals reviewed.    ED Treatments / Results  Labs (all labs ordered are listed, but only abnormal results are displayed) Labs Reviewed  BASIC METABOLIC PANEL -  Abnormal; Notable for the following components:      Result Value   Glucose, Bld 107 (*)    Creatinine, Ser 1.07 (*)    GFR calc non Af Amer 53 (*)    All other components within normal limits  CBC WITH DIFFERENTIAL/PLATELET - Abnormal; Notable for the following components:   WBC 11.6 (*)    RBC 3.71 (*)    Hemoglobin 11.8 (*)    Neutro Abs 8.4 (*)    Monocytes Absolute 1.3 (*)    Abs Immature Granulocytes 0.48 (*)    All other components within normal limits  I-STAT CG4 LACTIC ACID, ED    EKG None  Radiology Dg Chest 2 View  Result Date: 11/02/2017 CLINICAL DATA:  67 y/o F; positive flu test. Shortness of breath and weakness. EXAM: CHEST - 2 VIEW COMPARISON:  None. FINDINGS: Normal cardiac silhouette. Patchy consolidations in the upper lobes bilaterally. Diffuse increased reticular opacities of the lungs. No  pleural effusion or pneumothorax. No acute osseous abnormality is evident. IMPRESSION: Patchy consolidations in the upper lobes bilaterally, likely pneumonia. Electronically Signed   By: Mitzi Hansen M.D.   On: 11/02/2017 14:37    Procedures Procedures (including critical care time)  Medications Ordered in ED Medications  cefTRIAXone (ROCEPHIN) 1 g in sodium chloride 0.9 % 100 mL IVPB (1 g Intravenous New Bag/Given 11/02/17 1456)  azithromycin (ZITHROMAX) 500 mg in sodium chloride 0.9 % 250 mL IVPB (has no administration in time range)  sodium chloride 0.9 % bolus 1,000 mL (0 mLs Intravenous Stopped 11/02/17 1450)     Initial Impression / Assessment and Plan / ED Course  I have reviewed the triage vital signs and the nursing notes.  Pertinent labs & imaging results that were available during my care of the patient were reviewed by me and considered in my medical decision making (see chart for details).     67 year old female who presents for evaluation of cough, generalized malaise, shortness of breath.  Recently diagnosed with the flu on 10/26/2017.   Reports that she is at home, she has been having cough and some difficulty breathing.  States she will get in a fit and will have difficulty catching her breath.  Came today because symptoms worsening.  On initial ED arrival, patient is afebrile, appears slightly uncomfortable.  Signs reviewed and stable.  Patient is currently on 2 L O2 and maintaining sats at 95.  She initially had some difficulty breathing on EMS so they brought her in on O2.  She is not on oxygen at home.   BMP.  Plan, creatinine is within normal limits.  CBC shows leukocytosis of 11.6.  Initial lactic acid is negative.  Chest x-ray shows bilateral upper lobe pneumonia which is consistent with exam.  Given that patient has been on antibiotics for last 2 days as well as oxygen requirement, recommend admission for IV antibiotics and monitoring. Will consult hospitalist for admission.  Discussed with hospitalist. Will accept patient for admission.    Final Clinical Impressions(s) / ED Diagnoses   Final diagnoses:  Community acquired pneumonia, unspecified laterality    ED Discharge Orders    None       Rosana Hoes 11/02/17 1525    Lorre Nick, MD 11/04/17 1213

## 2017-11-02 NOTE — ED Notes (Signed)
ED TO INPATIENT HANDOFF REPORT  Name/Age/Gender Shelley Clarke 67 y.o. female  Code Status   Home/SNF/Other Home  Chief Complaint Flu  Level of Care/Admitting Diagnosis ED Disposition    ED Disposition Condition [facility] Area: [place] [[id number]]  Level of Care: Med-Surg [16]  Diagnosis: CAP (community acquired pneumonia) [347425]  Admitting Physician: Desiree Hane [[id number]]  Attending Physician: Desiree Hane [phone]  Estimated length of stay: past midnight tomorrow  Certification:: I certify this patient will need inpatient services for at least 2 midnights  PT Class (Do Not Modify): Inpatient [101]  PT Acc Code (Do Not Modify): Private [1]       Medical History Past Medical History:  Diagnosis Date  . Hypothyroidism     Allergies Allergies  Allergen Reactions  . Olanzapine Other (See Comments)    Swelling of the legs    IV Location/Drains/Wounds Patient Lines/Drains/Airways Status   Active Line/Drains/Airways    Name:   Placement date:   Placement time:   Site:   Days:   Peripheral IV 11/02/17 Right Antecubital   11/02/17    1312    Antecubital   less than 1          Labs/Imaging Results for orders placed or performed during the hospital encounter of 11/02/17 (from the past 48 hour(s))  Basic metabolic panel     Status: Abnormal   Collection Time: 11/02/17 12:30 PM  Result Value Ref Range   Sodium 139 135 - 145 mmol/L   Potassium 4.6 3.5 - 5.1 mmol/L   Chloride 102 98 - 111 mmol/L   CO2 28 22 - 32 mmol/L   Glucose, Bld 107 (H) 70 - 99 mg/dL   BUN 16 8 - 23 mg/dL   Creatinine, Ser 1.07 (H) 0.44 - 1.00 mg/dL   Calcium 9.1 8.9 - 10.3 mg/dL   GFR calc non Af Amer 53 (L) >60 mL/min   GFR calc Af Amer >60 >60 mL/min    Comment: (NOTE) The eGFR has been calculated using the CKD EPI equation. This calculation has not been validated in all clinical situations. eGFR's persistently <60 mL/min signify  possible Chronic Kidney Disease.    Anion gap 9 5 - 15    Comment: Performed at [facility], [place] [address] [place], [place] [address]  CBC with Differential     Status: Abnormal   Collection Time: 11/02/17 12:30 PM  Result Value Ref Range   WBC 11.6 (H) 4.0 - 10.5 K/uL   RBC 3.71 (L) 3.87 - 5.11 MIL/uL   Hemoglobin 11.8 (L) 12.0 - 15.0 g/dL   HCT 36.0 36.0 - 46.0 %   MCV 97.0 80.0 - 100.0 fL   MCH 31.8 26.0 - 34.0 pg   MCHC 32.8 30.0 - 36.0 g/dL   RDW 13.1 11.5 - 15.5 %   Platelets 300 150 - 400 K/uL   nRBC 0.0 0.0 - 0.2 %   Neutrophils Relative % 73 %   Neutro Abs 8.4 (H) 1.7 - 7.7 K/uL   Lymphocytes Relative 10 %   Lymphs Abs 1.2 0.7 - 4.0 K/uL   Monocytes Relative 11 %   Monocytes Absolute 1.3 (H) 0.1 - 1.0 K/uL   Eosinophils Relative 1 %   Eosinophils Absolute 0.2 0.0 - 0.5 K/uL   Basophils Relative 1 %   Basophils Absolute 0.1 0.0 - 0.1 K/uL   Immature Granulocytes 4 %   Abs Immature Granulocytes  0.48 (H) 0.00 - 0.07 K/uL    Comment: Performed at [facility], [place] [address], [place], [place] [address]  I-Stat CG4 Lactic Acid, ED     Status: None   Collection Time: 11/02/17 12:38 PM  Result Value Ref Range   Lactic Acid, Venous 0.96 0.5 - 1.9 mmol/L   Dg Chest 2 View  Result Date: 11/02/2017 CLINICAL DATA:  67 y/o F; positive flu test. Shortness of breath and weakness. EXAM: CHEST - 2 VIEW COMPARISON:  None. FINDINGS: Normal cardiac silhouette. Patchy consolidations in the upper lobes bilaterally. Diffuse increased reticular opacities of the lungs. No pleural effusion or pneumothorax. No acute osseous abnormality is evident. IMPRESSION: Patchy consolidations in the upper lobes bilaterally, likely pneumonia. Electronically Signed   By: Kristine Garbe M.D.   On: 11/02/2017 14:37   None  Pending Labs [employer] (From admission, onward)    Start     Ordered   Signed and Held  HIV antibody (Routine Testing)  Once,    R     Signed and Held   Signed and Held  Expectorated sputum assessment w rflx to resp cult  Once,   R     Signed and Held   Visual merchandiser and Held  Strep pneumoniae urinary antigen  Once,   R     Signed and Held   Signed and Held  TSH  Add-on,   R     Signed and Held   Signed and Held  CBC  (heparin)  Once,   R    Comments:  Baseline for heparin therapy IF NOT ALREADY DRAWN.  Notify MD if PLT < 100 K.    Signed and Held   Signed and Held  Creatinine, serum  (heparin)  Once,   R    Comments:  Baseline for heparin therapy IF NOT ALREADY DRAWN.    Signed and Held   Signed and Held  Basic metabolic panel  Tomorrow morning,   R     Signed and Held   Signed and Held  CBC  Tomorrow morning,   R     Signed and Held          Vitals/Pain Today's Vitals   11/02/17 1400 11/02/17 1505 11/02/17 1530 11/02/17 1600  BP: 120/63 112/61 110/62 (!) 113/54  Pulse: 66 61 76 64  Resp: '18 13 18 15  ' Temp:      TempSrc:      SpO2: 95% 96% 93% 94%    Isolation Precautions No active isolations  Medications Medications  azithromycin (ZITHROMAX) 500 mg in sodium chloride 0.9 % 250 mL IVPB (500 mg Intravenous New Bag/Given 11/02/17 1536)  sodium chloride 0.9 % bolus 1,000 mL (0 mLs Intravenous Stopped 11/02/17 1450)  cefTRIAXone (ROCEPHIN) 1 g in sodium chloride 0.9 % 100 mL IVPB (0 g Intravenous Stopped 11/02/17 1532)    Mobility non-ambulatory

## 2017-11-03 DIAGNOSIS — N183 Chronic kidney disease, stage 3 (moderate): Secondary | ICD-10-CM

## 2017-11-03 DIAGNOSIS — J9601 Acute respiratory failure with hypoxia: Secondary | ICD-10-CM

## 2017-11-03 DIAGNOSIS — J181 Lobar pneumonia, unspecified organism: Secondary | ICD-10-CM

## 2017-11-03 LAB — GLUCOSE, CAPILLARY: Glucose-Capillary: 82 mg/dL (ref 70–99)

## 2017-11-03 LAB — BASIC METABOLIC PANEL
Anion gap: 11 (ref 5–15)
BUN: 12 mg/dL (ref 8–23)
CO2: 26 mmol/L (ref 22–32)
CREATININE: 0.95 mg/dL (ref 0.44–1.00)
Calcium: 8.5 mg/dL — ABNORMAL LOW (ref 8.9–10.3)
Chloride: 101 mmol/L (ref 98–111)
GFR calc Af Amer: >60 mL/min (ref >60)
GFR calc non Af Amer: >60 mL/min (ref >60)
GLUCOSE: 86 mg/dL (ref 70–99)
Potassium: 3.9 mmol/L (ref 3.5–5.1)
SODIUM: 138 mmol/L (ref 135–145)

## 2017-11-03 LAB — STREP PNEUMONIAE URINARY ANTIGEN: STREP PNEUMO URINARY ANTIGEN: NEGATIVE

## 2017-11-03 LAB — CBC
HEMATOCRIT: 34.8 % — AB (ref 36.0–46.0)
HEMOGLOBIN: 11.2 g/dL — AB (ref 12.0–15.0)
MCH: 31.8 pg (ref 26.0–34.0)
MCHC: 32.2 g/dL (ref 30.0–36.0)
MCV: 98.9 fL (ref 80.0–100.0)
NRBC: 0 % (ref 0.0–0.2)
Platelets: 280 10*3/uL (ref 150–400)
RBC: 3.52 MIL/uL — ABNORMAL LOW (ref 3.87–5.11)
RDW: 13.5 % (ref 11.5–15.5)
WBC: 10.1 10*3/uL (ref 4.0–10.5)

## 2017-11-03 LAB — HIV ANTIBODY (ROUTINE TESTING W REFLEX): HIV Screen 4th Generation wRfx: NONREACTIVE

## 2017-11-03 MED ORDER — FLUOXETINE HCL 40 MG PO CAPS: 40.0000 mg | ORAL_CAPSULE | Freq: Every day | ORAL | Status: DC

## 2017-11-03 MED ORDER — ZIPRASIDONE HCL 80 MG PO CAPS
160.0000 mg | ORAL_CAPSULE | Freq: Every day | ORAL | Status: DC
  Administered 2017-11-03 – 2017-11-04 (×2): 160 mg via ORAL
  Filled 2017-11-03 (×2): qty 2

## 2017-11-03 MED ORDER — TRAMADOL HCL 50 MG PO TABS: 50.0000 mg | ORAL_TABLET | Freq: Four times a day (QID) | ORAL | Status: DC | PRN

## 2017-11-03 MED ORDER — FLUOXETINE HCL 20 MG PO CAPS
60.0000 mg | ORAL_CAPSULE | Freq: Every day | ORAL | Status: DC
  Administered 2017-11-03 – 2017-11-05 (×3): 60 mg via ORAL
  Filled 2017-11-03 (×4): qty 3

## 2017-11-03 MED ORDER — LEVOTHYROXINE SODIUM 112 MCG PO TABS: 112.0000 ug | ORAL_TABLET | ORAL | Status: DC

## 2017-11-03 MED ORDER — AZITHROMYCIN 250 MG PO TABS
500.0000 mg | ORAL_TABLET | Freq: Every day | ORAL | Status: DC
  Administered 2017-11-04: 500 mg via ORAL
  Filled 2017-11-03: qty 2

## 2017-11-03 NOTE — Evaluation (Signed)
Physical Therapy Evaluation Patient Details Name: Shelley Clarke MRN: [id number] DOB: [birth year] Today's Date: 11/03/2017   History of Present Illness  Pt is a 67 year old female admitted for acute respiratory failure with hypoxia due to  CAP post influenza A diagnoses and has PMHx significant for hypothyroidism, anxiety, bipolar disorder   Clinical Impression  Pt admitted with above diagnosis. Pt currently with functional limitations due to the deficits listed below (see PT Problem List).  Pt will benefit from skilled PT to increase their independence and safety with mobility to allow discharge to the venue listed below.  Pt with poor endurance, generalized weakness and decreased balance.  Pt has been functionally declining to past couple weeks and spouse has been providing increased assist.  Pt has had poor balance for a few years however with acute illnesses, balance is extremely poor at this time and pt is a high fall risk.  Pt was set to start OP PT next week however recommend SNF for rehab upon d/c prior to return home, and pt and spouse agreeable.     Follow Up Recommendations SNF    Equipment Recommendations  Rolling walker with 5" wheels    Recommendations for Other Services       Precautions / Restrictions Precautions Precautions: Fall Precaution Comments: HIGH fall risk, droplet      Mobility  Bed Mobility Overal bed mobility: Needs Assistance Bed Mobility: Supine to Sit           General bed mobility comments: pt up in recliner on arrival  Transfers Overall transfer level: Needs assistance Equipment used: Rolling walker (2 wheeled) Transfers: Sit to/from Stand Sit to Stand: Mod assist         General transfer comment: performed initially without RW and pt very unsteady, improved with use of UEs however pt pulled up on RW, spouse has been assisting pt with sit to stands at home due to weakness and poor balance, heavily leaning left upon first standing however  improved to midline with second attempt  Ambulation/Gait Ambulation/Gait assistance: Min assist;+2 safety/equipment Gait Distance (Feet): 6 Feet Assistive device: Rolling walker (2 wheeled) Gait Pattern/deviations: Step-through pattern;Narrow base of support;Ataxic     General Gait Details: very uncoordinated LEs, decreased hip/knee flexion observed (pt also unable to march in place with cues), requiring assist for steadying, utilitzed RW for UE support  Stairs            Wheelchair Mobility    Modified Rankin (Stroke Patients Only)       Balance Overall balance assessment: History of Falls;Needs assistance         Standing balance support: Bilateral upper extremity supported Standing balance-Leahy Scale: Zero Standing balance comment: requires UE support and external assist               High Level Balance Comments: pt unable to march in place holding RW, spouse reports frequent falls due to LOB 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); spouse reports visit to MD due to poor balance recently and MD recommended OP PT however pt was set to start this next week; also reports MRI performed with no acute finding - able to find this in care everywhere [facility] and indeed no acute findings             Pertinent Vitals/Pain Pain Assessment: No/denies pain    Home Living Family/patient expects to be discharged to:: Private residence Living Arrangements: Spouse/significant other   Type of Home: House Home  Access: Stairs to enter     Home Layout: One level Home Equipment: Cane - single point      Prior Function Level of Independence: Needs assistance   Gait / Transfers Assistance Needed: pt typically independent however has been falling due to poor balance for the past couple years, pt has progressively declined in mobility however to past couple weeks with flu and pneumonia diagnoses, spouse has been assisting her with transfers and limited  ambulation           Hand Dominance        Extremity/Trunk Assessment        Lower Extremity Assessment Lower Extremity Assessment: Generalized weakness(overall no significant difference in strength, pt denies numbness. tingling)       Communication   Communication: No difficulties  Cognition Arousal/Alertness: Awake/alert Behavior During Therapy: Flat affect Overall Cognitive Status: Impaired/Different from baseline Area of Impairment: Problem solving;Following commands;Safety/judgement                       Following Commands: Follows one step commands inconsistently Safety/Judgement: Decreased awareness of safety   Problem Solving: Slow processing;Difficulty sequencing;Requires verbal cues;Requires tactile cues General Comments: spouse reports confusion today and answering most questions for pt      General Comments      Exercises     Assessment/Plan    PT Assessment Patient needs continued PT services  PT Problem List Decreased strength;Decreased mobility;Decreased activity tolerance;Decreased balance;Decreased knowledge of use of DME;Cardiopulmonary status limiting activity;Decreased safety awareness       PT Treatment Interventions DME instruction;Therapeutic activities;Gait training;Therapeutic exercise;Patient/family education;Stair training;Balance training;Functional mobility training;Neuromuscular re-education    PT Goals (Current goals can be found in the Care Plan section)  Acute Rehab PT Goals PT Goal Formulation: With patient/family Time For Goal Achievement: 11/10/17 Potential to Achieve Goals: Good    Frequency Min 3X/week   Barriers to discharge        Co-evaluation               AM-PAC PT "6 Clicks" Daily Activity  Outcome Measure Difficulty turning over in bed (including adjusting bedclothes, sheets and blankets)?: A Lot Difficulty moving from lying on back to sitting on the side of the bed? : Unable Difficulty  sitting down on and standing up from a chair with arms (e.g., wheelchair, bedside commode, etc,.)?: Unable Help needed moving to and from a bed to chair (including a wheelchair)?: A Lot Help needed walking in hospital room?: A Lot Help needed climbing 3-5 steps with a railing? : Total 6 Click Score: 9    End of Session Equipment Utilized During Treatment: Gait belt;Oxygen Activity Tolerance: Patient limited by fatigue Patient left: in chair;with chair alarm set;with family/visitor present;with call bell/phone within reach Nurse Communication: Mobility status PT Visit Diagnosis: Unsteadiness on feet (R26.81);Repeated falls (R29.6);Muscle weakness (generalized) (M62.81)    Time: 8413-2440 PT Time Calculation (min) (ACUTE ONLY): 19 min   Charges:   PT Evaluation $PT Eval Moderate Complexity: 1 Mod        Zenovia Jarred, PT, DPT Acute Rehabilitation Services Office: [phone] Pager: [phone]   Sarajane Jews 11/03/2017, 4:02 PM

## 2017-11-03 NOTE — Progress Notes (Signed)
PHARMACIST - PHYSICIAN COMMUNICATION DR:   Radonna Ricker CONCERNING: Antibiotic IV to Oral Route Change Policy  RECOMMENDATION: This patient is receiving Zithromax by the intravenous route.  Based on criteria approved by the Pharmacy and Therapeutics Committee, the antibiotic(s) is/are being converted to the equivalent oral dose form(s).   DESCRIPTION: These criteria include:  Patient being treated for a respiratory tract infection, urinary tract infection, cellulitis or clostridium difficile associated diarrhea if on metronidazole  The patient is not neutropenic and does not exhibit a GI malabsorption state  The patient is eating (either orally or via tube) and/or has been taking other orally administered medications for a least 24 hours  The patient is improving clinically and has a Tmax < 100.5  If you have questions about this conversion, please contact the Pharmacy Department  []   [phone] )  [facility] []   [phone] )  [facility] []   [phone] )  [facility] []   [phone] )  [facility] [x]   [phone] )  Ilene Qua   Junita Push, PharmD, BCPS 11/03/2017@11 :11 AM

## 2017-11-03 NOTE — Progress Notes (Signed)
[facility] PROGRESS NOTE    Progress Note  Shelley Clarke  ZOX:[id number] DOB: 17-Aug-1950 DOA: 11/02/2017 PCP: Richmond Campbell., PA-C     Brief Narrative:   Shelley Clarke is an 67 y.o. female past medical history of hypothyroidism, anxiety bipolar disorder presents with a day of admission with 1 week worsening of cough and generalized malaise, her daughter was recently diagnosed with influenza patient continues to have worsening cough and decreased appetite was started on doxy on 10/30/2017, during that time she also was diagnosed with influenza A was noted and started on treatment as she was out of the window with little improvement.  Assessment/Plan:   Acute respiratory failure with hypoxia due to  CAP post influenza A diagnoses: Admission-her saturations were 89%.  She continues to desat every time she is taken off oxygen. She is hemodynamically stable. This started empirically on IV Rocephin and azithro, she has remained afebrile. Leukocytosis is improved. Consult physical therapy.  Conjunctivitis: Started on eyedrops.  Bipolar disorder: Continue Wellbutrin Geodon and Klonopin like Lamictal and Prozac.  Hypothyroidism: Continue Synthroid.  Depression: Continue current home meds.  Chronic kidney disease stage III: At baseline.  Acute confusional state: Resume all her home medications. Check a 12-lead EKG in the morning use Melatonin at night.    DVT prophylaxis: lovenox Family Communication:husband Disposition Plan/Barrier to D/C: home in 2 day Code Status:     Code Status Orders  (From admission, onward)         Start     Ordered   11/02/17 1734  Do not attempt resuscitation (DNR)  Continuous    Question Answer Comment  In the event of cardiac or respiratory ARREST Do not call a "code blue"   In the event of cardiac or respiratory ARREST Do not perform Intubation, CPR, defibrillation or ACLS   In the event of cardiac or respiratory ARREST Use  medication by any route, position, wound care, and other measures to relive pain and suffering. May use oxygen, suction and manual treatment of airway obstruction as needed for comfort.   Comments discussed with patient on day of admission      11/02/17 1733        Code Status History    This patient has a current code status but no historical code status.        IV Access:    Peripheral IV   Procedures and diagnostic studies:   Dg Chest 2 View  Result Date: 11/02/2017 CLINICAL DATA:  67 y/o F; positive flu test. Shortness of breath and weakness. EXAM: CHEST - 2 VIEW COMPARISON:  None. FINDINGS: Normal cardiac silhouette. Patchy consolidations in the upper lobes bilaterally. Diffuse increased reticular opacities of the lungs. No pleural effusion or pneumothorax. No acute osseous abnormality is evident. IMPRESSION: Patchy consolidations in the upper lobes bilaterally, likely pneumonia. Electronically Signed   By: Mitzi Hansen M.D.   On: 11/02/2017 14:37     Medical Consultants:    None.  Anti-Infectives:   IV Rocephin and azithromycin.  Subjective:    Shelley Clarke as per husband she is still confused and it fluctuates throughout the day.  Objective:    Vitals:   11/02/17 2006 11/02/17 2145 11/03/17 0534 11/03/17 0801  BP:   (!) 144/70   Pulse:   66   Resp:   (!) 22   Temp:   98.7 F (37.1 C)   TempSrc:   Oral   SpO2: 93% 94% 92% 93%  Intake/Output Summary (Last 24 hours) at 11/03/2017 1011 Last data filed at 11/03/2017 0700 Gross per 24 hour  Intake 2809 ml  Output 1375 ml  Net 1434 ml   There were no vitals filed for this visit.  Exam: General exam: In no acute distress. Respiratory system: Good air movement and clear to auscultation. Cardiovascular system: S1 & S2 heard, RRR.  Gastrointestinal system: Abdomen is nondistended, soft and nontender.  Central nervous system: Alert and oriented. No focal neurological  deficits. Extremities: No pedal edema. Skin: No rashes, lesions or ulcers Psychiatry: Judgement and insight appear normal. Mood & affect appropriate.    Data Reviewed:    Labs: Basic Metabolic Panel: Recent Labs  Lab 11/02/17 1230 11/02/17 1800 11/03/17 0639  NA 139  --  138  K 4.6  --  3.9  CL 102  --  101  CO2 28  --  26  GLUCOSE 107*  --  86  BUN 16  --  12  CREATININE 1.07* 1.12* 0.95  CALCIUM 9.1  --  8.5*   GFR CrCl cannot be calculated (Unknown ideal weight.). Liver Function Tests: No results for input(s): AST, ALT, ALKPHOS, BILITOT, PROT, ALBUMIN in the last 168 hours. No results for input(s): LIPASE, AMYLASE in the last 168 hours. No results for input(s): AMMONIA in the last 168 hours. Coagulation profile No results for input(s): INR, PROTIME in the last 168 hours.  CBC: Recent Labs  Lab 11/02/17 1230 11/02/17 1800 11/03/17 0639  WBC 11.6* 12.2* 10.1  NEUTROABS 8.4*  --   --   HGB 11.8* 11.6* 11.2*  HCT 36.0 35.8* 34.8*  MCV 97.0 98.1 98.9  PLT 300 314 280   Cardiac Enzymes: No results for input(s): CKTOTAL, CKMB, CKMBINDEX, TROPONINI in the last 168 hours. BNP (last 3 results) No results for input(s): PROBNP in the last 8760 hours. CBG: Recent Labs  Lab 11/03/17 0750  GLUCAP 82   D-Dimer: No results for input(s): DDIMER in the last 72 hours. Hgb A1c: No results for input(s): HGBA1C in the last 72 hours. Lipid Profile: No results for input(s): CHOL, HDL, LDLCALC, TRIG, CHOLHDL, LDLDIRECT in the last 72 hours. Thyroid function studies: Recent Labs    11/02/17 1800  TSH 3.097   Anemia work up: No results for input(s): VITAMINB12, FOLATE, FERRITIN, TIBC, IRON, RETICCTPCT in the last 72 hours. Sepsis Labs: Recent Labs  Lab 11/02/17 1230 11/02/17 1238 11/02/17 1800 11/03/17 0639  WBC 11.6*  --  12.2* 10.1  LATICACIDVEN  --  0.96  --   --    Microbiology No results found for this or any previous visit (from the past 240  hour(s)).   Medications:   . albuterol  2.5 mg Nebulization BID  . aspirin EC  81 mg Oral Daily  . benzonatate  100 mg Oral BID  . buPROPion  300 mg Oral Daily  . clonazePAM  0.5 mg Oral BID  . clonazePAM  1 mg Oral QHS  . guaiFENesin  600 mg Oral BID  . heparin  5,000 Units Subcutaneous Q8H  . HYDROcodone-homatropine  5 mL Oral BID  . lamoTRIgine  300 mg Oral QHS  . levothyroxine  100 mcg Oral Once per day on Sun Mon Tue Wed Thu Fri  . [START ON 11/06/2017] levothyroxine  112 mcg Oral Once per day on Sat  . trimethoprim-polymyxin b  1 drop Both Eyes QID  . valACYclovir  500 mg Oral Daily  . ziprasidone  80 mg Oral QHS  Continuous Infusions: . sodium chloride 100 mL/hr at 11/03/17 0656  . azithromycin    . cefTRIAXone (ROCEPHIN)  IV 1 g (11/03/17 1610)     LOS: 1 day   Marinda Elk  [facility] Pager [phone]  *Please refer to [email or web address], password [email or web address] to get updated schedule on who will round on this patient, as hospitalists switch teams weekly. If 7PM-7AM, please contact night-coverage at [email or web address], password [email or web address] for any overnight needs.  11/03/2017, 10:11 AM

## 2017-11-04 LAB — GLUCOSE, CAPILLARY: GLUCOSE-CAPILLARY: 96 mg/dL (ref 70–99)

## 2017-11-04 MED ORDER — AMOXICILLIN-POT CLAVULANATE 875-125 MG PO TABS: 1.0000 | ORAL_TABLET | Freq: Two times a day (BID) | ORAL | Status: DC

## 2017-11-04 NOTE — Clinical Social Work Note (Signed)
Clinical Social Work Assessment  Patient Details  Name: Shelley Clarke MRN: [id number] Date of Birth: December 07, 1950  Date of referral:  11/04/17               Reason for consult:  Facility Placement                Permission sought to share information with:  Facility Medical sales representative, Family Supports Permission granted to share information::  Yes, Verbal Permission Granted  Name::     Shelley Clarke  Agency::     Relationship::  Husband  Contact Information:  [phone]  Housing/Transportation Living arrangements for the past 2 months:  Single Family Home Source of Information:  Patient, Spouse(Husband - Lucile Crater) Patient Interpreter Needed:  None Criminal Activity/Legal Involvement Pertinent to Current Situation/Hospitalization:  No - Comment as needed Significant Relationships:  Spouse Lives with:  Spouse Do you feel safe going back to the place where you live?  (PT recommending SNF) Need for family participation in patient care:  No (Coment)  Care giving concerns:  Patient from home with husband and reported that she needed help with ADLs prior to hospitalization because she had the flu. Patient "admitted for acute respiratory failure with hypoxia due to  CAP post influenza A diagnoses and has PMHx significant for hypothyroidism, anxiety, bipolar disorder". PT recommending SNF.   Social Worker assessment / plan:  CSW spoke with patient/patient's husband at bedside regarding PT recommendation for SNF and discharge planning. CSW explained SNF placement process and insurance authorization process, patient verbalized understanding. Patient's husband reported that patient has Medicare PART A (primary insurance) and provided CSW with Medicare card. Patient reported that she is agreeable to SNF for ST rehab. CSW agreed to complete patient's FL2 and follow up with bed offers.   CSW will continue to follow and assist with discharge planning.  Employment status:   Retired Engineer, mining, Managed Care PT Recommendations:  Skilled Nursing Facility Information / Referral to community resources:  Skilled Nursing Facility  Patient/Family's Response to care:  Patient/patient's husband appreciative of CSW assistance with discharge planning.  Patient/Family's Understanding of and Emotional Response to Diagnosis, Current Treatment, and Prognosis:  Patient presented calm and verbalized understanding of PT recommendation for SNF. Patient's husband involved in patient's care and verbalized strong understanding of patient's current treatment plan. Patient's husband informed CSW about patient's recent difficulties with ambulating and how patient was scheduled to start outpatient PT prior to hospitalization. Patient's husband in agreement with patient's plan to discharge to SNF for ST rehab.  Emotional Assessment Appearance:  Appears stated age Attitude/Demeanor/Rapport:  Other(Cooperative) Affect (typically observed):  Appropriate, Calm Orientation:  Oriented to Self, Oriented to Situation, Oriented to Place, Oriented to  Time Alcohol / Substance use:  Not Applicable Psych involvement (Current and /or in the community):  No (Comment)  Discharge Needs  Concerns to be addressed:  Care Coordination Readmission within the last 30 days:  No Current discharge risk:  Physical Impairment Barriers to Discharge:  Continued Medical Work up   [employer], LCSW 11/04/2017, 12:15 PM

## 2017-11-04 NOTE — NC FL2 (Signed)
[place] MEDICAID FL2 LEVEL OF CARE SCREENING TOOL     IDENTIFICATION  Patient Name: Shelley Clarke Birthdate: Jan 03, 1951 Sex: female Admission Date (Current Location): 11/02/2017  [facility] and [place] Number:  Producer, television/film/video and Address:  [facility],  [address] [place], [place] [address]      Provider Number: [id number]  Attending Physician Name and Address:  Marinda Elk, MD  Relative Name and Phone Number:       Current Level of Care: Hospital Recommended Level of Care: Skilled Nursing Facility Prior Approval Number:    Date Approved/Denied:   PASRR Number:    Discharge Plan: SNF    Current Diagnoses: Patient Active Problem List   Diagnosis Date Noted  . CAP (community acquired pneumonia) 11/02/2017  . Influenza A 11/02/2017  . Acute respiratory failure with hypoxia (HCC) 11/02/2017  . CKD (chronic kidney disease) stage 3, GFR 30-59 ml/min (HCC) 11/02/2017  . Bipolar disorder (HCC) 11/02/2017  . Anxiety 04/15/2015  . Depression 04/15/2015  . Hypothyroidism 04/15/2015    Orientation RESPIRATION BLADDER Height & Weight     Self, Time, Situation, Place  Normal Continent Weight:   Height:     BEHAVIORAL SYMPTOMS/MOOD NEUROLOGICAL BOWEL NUTRITION STATUS        Diet(regular)  AMBULATORY STATUS COMMUNICATION OF NEEDS Skin   Limited Assist Verbally Normal                       Personal Care Assistance Level of Assistance  Bathing, Feeding, Dressing Bathing Assistance: Maximum assistance Feeding assistance: Independent Dressing Assistance: Maximum assistance     Functional Limitations Info  Sight, Hearing, Speech Sight Info: Adequate Hearing Info: Adequate Speech Info: Adequate    SPECIAL CARE FACTORS FREQUENCY  PT (By licensed PT), OT (By licensed OT)     PT Frequency: 5x/week OT Frequency: 5x/week            Contractures      Additional Factors Info  Code Status, Allergies Code Status Info: DNR Allergies  Info: Olanzapine           Current Medications (11/04/2017):  This is the current hospital active medication list Current Facility-Administered Medications  Medication Dose Route Frequency Provider Last Rate Last Dose  . 0.9 %  sodium chloride infusion   Intravenous Continuous Marinda Elk, MD 10 mL/hr at 11/04/17 1020    . acetaminophen (TYLENOL) tablet 650 mg  650 mg Oral Q6H PRN Roberto Scales D, MD      . albuterol (PROVENTIL) (2.5 MG/3ML) 0.083% nebulizer solution 2.5 mg  2.5 mg Nebulization BID Roberto Scales D, MD   2.5 mg at 11/04/17 0755  . amoxicillin-clavulanate (AUGMENTIN) 875-125 MG per tablet 1 tablet  1 tablet Oral BID WC Marinda Elk, MD      . aspirin EC tablet 81 mg  81 mg Oral Daily Roberto Scales D, MD   81 mg at 11/04/17 1013  . azithromycin (ZITHROMAX) tablet 500 mg  500 mg Oral Daily Marinda Elk, MD   500 mg at 11/04/17 1012  . benzonatate (TESSALON) capsule 100 mg  100 mg Oral BID Roberto Scales D, MD   100 mg at 11/04/17 1014  . buPROPion (WELLBUTRIN XL) 24 hr tablet 300 mg  300 mg Oral Daily Roberto Scales D, MD   300 mg at 11/04/17 1014  . cefTRIAXone (ROCEPHIN) 1 g in sodium chloride 0.9 % 100 mL IVPB  1 g Intravenous Q24H Feliz  Rosine Beat, MD 200 mL/hr at 11/04/17 1026 1 g at 11/04/17 1026  . clonazePAM (KLONOPIN) tablet 0.5 mg  0.5 mg Oral BID Roberto Scales D, MD   0.5 mg at 11/04/17 0810  . clonazePAM (KLONOPIN) tablet 1 mg  1 mg Oral QHS Roberto Scales D, MD   1 mg at 11/03/17 2036  . FLUoxetine (PROZAC) capsule 60 mg  60 mg Oral Daily Marinda Elk, MD   60 mg at 11/04/17 1013  . guaiFENesin (MUCINEX) 12 hr tablet 600 mg  600 mg Oral BID Roberto Scales D, MD   600 mg at 11/04/17 1014  . heparin injection 5,000 Units  5,000 Units Subcutaneous Q8H Roberto Scales D, MD   5,000 Units at 11/04/17 0558  . HYDROcodone-homatropine (HYCODAN) 5-1.5 MG/5ML syrup 5 mL  5 mL Oral BID Roberto Scales D, MD   5 mL at 11/04/17 1014  .  lamoTRIgine (LAMICTAL) tablet 300 mg  300 mg Oral QHS Roberto Scales D, MD   300 mg at 11/03/17 2034  . levothyroxine (SYNTHROID, LEVOTHROID) tablet 100 mcg  100 mcg Oral Once per day on Sun Mon Tue Wed Thu Fri Roberto Scales D, MD   100 mcg at 11/04/17 6644  . [START ON 11/06/2017] levothyroxine (SYNTHROID, LEVOTHROID) tablet 112 mcg  112 mcg Oral Once per day on Sat Roberto Scales D, MD      . ondansetron (ZOFRAN) tablet 4 mg  4 mg Oral Q6H PRN Roberto Scales D, MD       Or  . ondansetron (ZOFRAN) injection 4 mg  4 mg Intravenous Q6H PRN Roberto Scales D, MD      . senna-docusate (Senokot-S) tablet 1 tablet  1 tablet Oral QHS PRN Roberto Scales D, MD      . traMADol Janean Sark) tablet 50 mg  50 mg Oral Q6H PRN Marinda Elk, MD      . trimethoprim-polymyxin b (POLYTRIM) ophthalmic solution 1 drop  1 drop Both Eyes QID Roberto Scales D, MD   1 drop at 11/04/17 1014  . valACYclovir (VALTREX) tablet 500 mg  500 mg Oral Daily Roberto Scales D, MD   500 mg at 11/04/17 1013  . ziprasidone (GEODON) capsule 160 mg  160 mg Oral QHS Marinda Elk, MD   160 mg at 11/03/17 2205  . ziprasidone (GEODON) capsule 80 mg  80 mg Oral QHS Roberto Scales D, MD   80 mg at 11/03/17 2205     Discharge Medications: Please see discharge summary for a list of discharge medications.  Relevant Imaging Results:  Relevant Lab Results:   Additional Information SSN [id number]  Antionette Poles, LCSW

## 2017-11-04 NOTE — Progress Notes (Signed)
During assessment, patient's husband told me that "I woke up around 0230 a.m. And my wife sitting on the floor. Her husband said I think she just kind of slid out of the bed; I just picked her up and put her back to bed." I asked him if he told the nurse or any staff members, he said "he did not, he is so use to doing it at home." During assessment, no injuries were noted. Md notified.

## 2017-11-04 NOTE — Progress Notes (Signed)
SATURATION QUALIFICATIONS: (This note is used to comply with regulatory documentation for home oxygen)  Patient Saturations on Room Air at Rest = 97%  Patient Saturations on Room Air while Ambulating = 92%  Patient Saturations on  Liters of oxygen while Ambulating = %  Please briefly explain why patient needs home oxygen:

## 2017-11-04 NOTE — Progress Notes (Signed)
[facility] PROGRESS NOTE    Progress Note  Shelley Clarke  ZOX:[id number] DOB: 10/25/1950 DOA: 11/02/2017 PCP: Richmond Campbell., PA-C     Brief Narrative:   Shelley Clarke is an 67 y.o. female past medical history of hypothyroidism, anxiety bipolar disorder presents with a day of admission with 1 week worsening of cough and generalized malaise, her daughter was recently diagnosed with influenza patient continues to have worsening cough and decreased appetite was started on doxy on 10/30/2017, during that time she also was diagnosed with influenza A was noted and started on treatment as she was out of the window with little improvement.  Assessment/Plan:   Acute respiratory failure with hypoxia due to  CAP post influenza A diagnoses: She has remained afebrile with no leukocytosis, she has been taken off oxygen and is satting well, I will ambulate her and check her saturations. Continue IV Rocephin and azithro Therapy evaluated the patient the recommended skilled nursing facility.  Will contact Child psychotherapist.  Conjunctivitis: Started on eyedrops.  Bipolar disorder: Continue Wellbutrin Geodon and Klonopin like Lamictal and Prozac.  Hypothyroidism: Continue Synthroid.  Depression: Continue current home meds.  Chronic kidney disease stage III: At baseline.  Acute confusional state: Resume all her home medications. Some confusion overnight but awake alert and oriented today.  DVT prophylaxis: lovenox Family Communication:husband Disposition Plan/Barrier to D/C: SNF in 2 day Code Status:     Code Status Orders  (From admission, onward)         Start     Ordered   11/02/17 1734  Do not attempt resuscitation (DNR)  Continuous    Question Answer Comment  In the event of cardiac or respiratory ARREST Do not call a "code blue"   In the event of cardiac or respiratory ARREST Do not perform Intubation, CPR, defibrillation or ACLS   In the event of cardiac or  respiratory ARREST Use medication by any route, position, wound care, and other measures to relive pain and suffering. May use oxygen, suction and manual treatment of airway obstruction as needed for comfort.   Comments discussed with patient on day of admission      11/02/17 1733        Code Status History    This patient has a current code status but no historical code status.        IV Access:    Peripheral IV   Procedures and diagnostic studies:   Dg Chest 2 View  Result Date: 11/02/2017 CLINICAL DATA:  67 y/o F; positive flu test. Shortness of breath and weakness. EXAM: CHEST - 2 VIEW COMPARISON:  None. FINDINGS: Normal cardiac silhouette. Patchy consolidations in the upper lobes bilaterally. Diffuse increased reticular opacities of the lungs. No pleural effusion or pneumothorax. No acute osseous abnormality is evident. IMPRESSION: Patchy consolidations in the upper lobes bilaterally, likely pneumonia. Electronically Signed   By: Mitzi Hansen M.D.   On: 11/02/2017 14:37     Medical Consultants:    None.  Anti-Infectives:   IV Rocephin and azithromycin.  Subjective:    Shelley Clarke as per husband her mentation continues to fluctuate.  Objective:    Vitals:   11/03/17 2007 11/03/17 2027 11/04/17 0530 11/04/17 0755  BP:  134/71 124/60   Pulse:  68 64   Resp:  15 (!) 24   Temp:  99.3 F (37.4 C) 97.8 F (36.6 C)   TempSrc:  Oral Oral   SpO2: 93% 97% 95% 95%  Intake/Output Summary (Last 24 hours) at 11/04/2017 0910 Last data filed at 11/04/2017 0400 Gross per 24 hour  Intake 1580 ml  Output -  Net 1580 ml   There were no vitals filed for this visit.  Exam: General exam: In no acute distress. Respiratory system: Good air movement and clear to auscultation. Cardiovascular system: S1 & S2 heard, RRR.  Gastrointestinal system: Abdomen is nondistended, soft and nontender.  Central nervous system: Alert and oriented. No focal  neurological deficits. Extremities: No pedal edema. Skin: No rashes, lesions or ulcers Psychiatry: Judgement and insight appear normal. Mood & affect appropriate.    Data Reviewed:    Labs: Basic Metabolic Panel: Recent Labs  Lab 11/02/17 1230 11/02/17 1800 11/03/17 0639  NA 139  --  138  K 4.6  --  3.9  CL 102  --  101  CO2 28  --  26  GLUCOSE 107*  --  86  BUN 16  --  12  CREATININE 1.07* 1.12* 0.95  CALCIUM 9.1  --  8.5*   GFR CrCl cannot be calculated (Unknown ideal weight.). Liver Function Tests: No results for input(s): AST, ALT, ALKPHOS, BILITOT, PROT, ALBUMIN in the last 168 hours. No results for input(s): LIPASE, AMYLASE in the last 168 hours. No results for input(s): AMMONIA in the last 168 hours. Coagulation profile No results for input(s): INR, PROTIME in the last 168 hours.  CBC: Recent Labs  Lab 11/02/17 1230 11/02/17 1800 11/03/17 0639  WBC 11.6* 12.2* 10.1  NEUTROABS 8.4*  --   --   HGB 11.8* 11.6* 11.2*  HCT 36.0 35.8* 34.8*  MCV 97.0 98.1 98.9  PLT 300 314 280   Cardiac Enzymes: No results for input(s): CKTOTAL, CKMB, CKMBINDEX, TROPONINI in the last 168 hours. BNP (last 3 results) No results for input(s): PROBNP in the last 8760 hours. CBG: Recent Labs  Lab 11/03/17 0750 11/04/17 0758  GLUCAP 82 96   D-Dimer: No results for input(s): DDIMER in the last 72 hours. Hgb A1c: No results for input(s): HGBA1C in the last 72 hours. Lipid Profile: No results for input(s): CHOL, HDL, LDLCALC, TRIG, CHOLHDL, LDLDIRECT in the last 72 hours. Thyroid function studies: Recent Labs    11/02/17 1800  TSH 3.097   Anemia work up: No results for input(s): VITAMINB12, FOLATE, FERRITIN, TIBC, IRON, RETICCTPCT in the last 72 hours. Sepsis Labs: Recent Labs  Lab 11/02/17 1230 11/02/17 1238 11/02/17 1800 11/03/17 0639  WBC 11.6*  --  12.2* 10.1  LATICACIDVEN  --  0.96  --   --    Microbiology No results found for this or any previous visit  (from the past 240 hour(s)).   Medications:   . albuterol  2.5 mg Nebulization BID  . aspirin EC  81 mg Oral Daily  . azithromycin  500 mg Oral Daily  . benzonatate  100 mg Oral BID  . buPROPion  300 mg Oral Daily  . clonazePAM  0.5 mg Oral BID  . clonazePAM  1 mg Oral QHS  . FLUoxetine  60 mg Oral Daily  . guaiFENesin  600 mg Oral BID  . heparin  5,000 Units Subcutaneous Q8H  . HYDROcodone-homatropine  5 mL Oral BID  . lamoTRIgine  300 mg Oral QHS  . levothyroxine  100 mcg Oral Once per day on Sun Mon Tue Wed Thu Fri  . [START ON 11/06/2017] levothyroxine  112 mcg Oral Once per day on Sat  . trimethoprim-polymyxin b  1 drop Both  Eyes QID  . valACYclovir  500 mg Oral Daily  . ziprasidone  160 mg Oral QHS  . ziprasidone  80 mg Oral QHS   Continuous Infusions: . sodium chloride 100 mL/hr at 11/03/17 1905  . cefTRIAXone (ROCEPHIN)  IV 1 g (11/03/17 1610)     LOS: 2 days   Marinda Elk  [facility] Pager [phone]  *Please refer to [email or web address], password [email or web address] to get updated schedule on who will round on this patient, as hospitalists switch teams weekly. If 7PM-7AM, please contact night-coverage at [email or web address], password [email or web address] for any overnight needs.  11/04/2017, 9:10 AM

## 2017-11-04 NOTE — Progress Notes (Signed)
Physical Therapy Treatment Patient Details Name: Shelley Clarke MRN: [id number] DOB: [birth year] Today's Date: 11/04/2017    History of Present Illness Pt is a 67 year old female admitted for acute respiratory failure with hypoxia due to  CAP post influenza A diagnoses and has PMHx significant for hypothyroidism, anxiety, bipolar disorder     PT Comments    Pt assisted to bathroom and then able to ambulate after short rest break.  Recliner following for safety.  Pt continues to require at least mod assist for mobility due to poor balance.  Continue to recommend SNF upon d/c.   Follow Up Recommendations  SNF     Equipment Recommendations  Rolling walker with 5" wheels    Recommendations for Other Services       Precautions / Restrictions Precautions Precautions: Fall Precaution Comments: HIGH fall risk    Mobility  Bed Mobility Overal bed mobility: Needs Assistance Bed Mobility: Supine to Sit     Supine to sit: Mod assist     General bed mobility comments: assist for upper body, pt with posterior lean  Transfers Overall transfer level: Needs assistance Equipment used: Rolling walker (2 wheeled) Transfers: Sit to/from Stand Sit to Stand: Mod assist         General transfer comment: verbal cues for hand placement, initially mod assist from bed however improved from [facility] (over toilet) and recliner with cues to use armrests  Ambulation/Gait Ambulation/Gait assistance: Min assist;+2 safety/equipment Gait Distance (Feet): 40 Feet Assistive device: Rolling walker (2 wheeled) Gait Pattern/deviations: Step-through pattern;Narrow base of support;Ataxic     General Gait Details: very uncoordinated LEs, improved ability to perform hip/knee flexion today, requiring assist for steadying, spouse following with recliner for safety, Spo2 92% on room air during ambulation   Stairs             Wheelchair Mobility    Modified Rankin (Stroke Patients Only)        Balance                                            Cognition Arousal/Alertness: Awake/alert Behavior During Therapy: Flat affect Overall Cognitive Status: Impaired/Different from baseline                         Following Commands: Follows one step commands with increased time Safety/Judgement: Decreased awareness of safety   Problem Solving: Slow processing;Difficulty sequencing;Requires verbal cues General Comments: appears less confused today however still presents with cognitive deficits      Exercises      General Comments        Pertinent Vitals/Pain Pain Assessment: No/denies pain    Home Living                      Prior Function            PT Goals (current goals can now be found in the care plan section) Progress towards PT goals: Progressing toward goals    Frequency    Min 3X/week      PT Plan Current plan remains appropriate    Co-evaluation              AM-PAC PT "6 Clicks" Daily Activity  Outcome Measure  Difficulty turning over in bed (including adjusting bedclothes, sheets and blankets)?: A Lot Difficulty moving from  lying on back to sitting on the side of the bed? : Unable Difficulty sitting down on and standing up from a chair with arms (e.g., wheelchair, bedside commode, etc,.)?: Unable Help needed moving to and from a bed to chair (including a wheelchair)?: A Lot Help needed walking in hospital room?: A Lot Help needed climbing 3-5 steps with a railing? : Total 6 Click Score: 9    End of Session Equipment Utilized During Treatment: Gait belt Activity Tolerance: Patient tolerated treatment well Patient left: in chair;with chair alarm set;with call bell/phone within reach;with family/visitor present Nurse Communication: Mobility status PT Visit Diagnosis: Unsteadiness on feet (R26.81);Repeated falls (R29.6);Muscle weakness (generalized) (M62.81)     Time: 3244-0102 PT Time Calculation  (min) (ACUTE ONLY): 20 min  Charges:  $Gait Training: 8-22 mins                     Zenovia Jarred, PT, DPT Acute Rehabilitation Services Office: [phone] Pager: [phone]  Sarajane Jews 11/04/2017, 4:00 PM

## 2017-11-05 DIAGNOSIS — F05 Delirium due to known physiological condition: Secondary | ICD-10-CM

## 2017-11-05 DIAGNOSIS — F319 Bipolar disorder, unspecified: Secondary | ICD-10-CM

## 2017-11-05 DIAGNOSIS — E039 Hypothyroidism, unspecified: Secondary | ICD-10-CM

## 2017-11-05 DIAGNOSIS — J189 Pneumonia, unspecified organism: Secondary | ICD-10-CM

## 2017-11-05 LAB — GLUCOSE, CAPILLARY: Glucose-Capillary: 85 mg/dL (ref 70–99)

## 2017-11-05 MED ORDER — DOXYCYCLINE HYCLATE 100 MG PO TABS: 100.0000 mg | ORAL_TABLET | Freq: Two times a day (BID) | ORAL | 0 refills | Status: DC

## 2017-11-05 MED ORDER — ZIPRASIDONE HCL 80 MG PO CAPS
80.0000 mg | ORAL_CAPSULE | Freq: Every day | ORAL | Status: DC
  Filled 2017-11-05 (×2): qty 1

## 2017-11-05 MED ORDER — ZIPRASIDONE HCL 80 MG PO CAPS
80.0000 mg | ORAL_CAPSULE | Freq: Three times a day (TID) | ORAL | Status: DC
  Administered 2017-11-05: 80 mg via ORAL
  Filled 2017-11-05 (×2): qty 1

## 2017-11-05 MED ORDER — AMOXICILLIN-POT CLAVULANATE 875-125 MG PO TABS: 1.0000 | ORAL_TABLET | Freq: Two times a day (BID) | ORAL | 0 refills | Status: DC

## 2017-11-05 MED ORDER — AMOXICILLIN-POT CLAVULANATE 875-125 MG PO TABS
1.0000 | ORAL_TABLET | Freq: Two times a day (BID) | ORAL | Status: DC
  Administered 2017-11-05 (×2): 1 via ORAL
  Filled 2017-11-05 (×2): qty 1

## 2017-11-05 NOTE — Discharge Summary (Signed)
Physician Discharge Summary  MAYA ARCAND ZOX:[id number] DOB: 1950-04-08 DOA: 11/02/2017  PCP: Richmond Campbell., PA-C  Admit date: 11/02/2017 Discharge date: 11/05/2017  Admitted From: home Disposition:  SNF  Recommendations for Outpatient Follow-up:  1. Follow up with PCP in 1-2 weeks 2. Repeat a chest x-ray in 6 to 8 weeks to ensure pneumonia has cleared.  Home Health:No Equipment/Devices:none  Discharge Condition:stable CODE STATUS:full Diet recommendation: Heart Healthy  Brief/Interim Summary: 67 y.o. female past medical history of hypothyroidism, anxiety bipolar disorder presents with a day of admission with 1 week worsening of cough and generalized malaise, her daughter was recently diagnosed with influenza patient continues to have worsening cough and decreased appetite was started on doxy on 10/30/2017, during that time she also was diagnosed with influenza A was noted and started on treatment as she was out of the window with little improvement.  Discharge Diagnoses:  Active Problems:   Anxiety   Depression   Hypothyroidism   CAP (community acquired pneumonia)   Influenza A   Acute respiratory failure with hypoxia (HCC)   CKD (chronic kidney disease) stage 3, GFR 30-59 ml/min (HCC)   Bipolar disorder (HCC) Acute respiratory failure with hypoxia due to community-acquired pneumonia post influenza: She was started on IV Rocephin and azithromycin she defervesced her saturations remained stable. Physical therapy evaluated the patient the recommended skilled nursing facility to continue Augmentin and doxycycline for 3 additional days an outpatient.  Conjunctivitis: No changes made to her medication.  Bipolar disorder: No changes were made to her medication.  Hypothyroidism: Continue Synthroid no changes were made to her medication.  Depression: Continue Wellbutrin and Prozac which are her current home dose regimen.  Chronic kidney disease stage III: Creatinine  remained at baseline.  Acute confusional state: This improved with Seroquel at bedtime.   Discharge Instructions  Discharge Instructions    Diet - low sodium heart healthy   Complete by:  As directed    Increase activity slowly   Complete by:  As directed      Allergies as of 11/05/2017      Reactions   Olanzapine Other (See Comments)   Swelling of the legs      Medication List    TAKE these medications   albuterol 108 (90 Base) MCG/ACT inhaler Commonly known as:  PROVENTIL HFA;VENTOLIN HFA Inhale 2 puffs into the lungs 2 (two) times daily.   amoxicillin-clavulanate 875-125 MG tablet Commonly known as:  AUGMENTIN Take 1 tablet by mouth 2 (two) times daily with a meal.   aspirin EC 81 MG tablet Take 81 mg by mouth.   Biotin 1000 MCG tablet Take 1,000 mcg by mouth daily.   buPROPion 300 MG 24 hr tablet Commonly known as:  WELLBUTRIN XL Take 300 mg by mouth daily.   CALCIUM 600 600 MG Tabs tablet Generic drug:  calcium carbonate Take 600 mg by mouth 2 (two) times daily with a meal.   clonazePAM 0.5 MG tablet Commonly known as:  KLONOPIN Take 0.5 mg by mouth as directed. 1 in the morning, 1 during the day and 2 at bedtime   doxycycline 100 MG tablet Commonly known as:  VIBRA-TABS Take 1 tablet (100 mg total) by mouth 2 (two) times daily.   FLUoxetine 20 MG tablet Commonly known as:  PROZAC Take 20 mg by mouth daily. With 40 MG   FLUoxetine 40 MG capsule Commonly known as:  PROZAC Take 2 every morning   HYDROcodone-homatropine 5-1.5 MG/5ML syrup Commonly known as:  HYCODAN Take 5 mLs by mouth 2 (two) times daily.   lamoTRIgine 150 MG tablet Commonly known as:  LAMICTAL Take 300 mg by mouth at bedtime.   levothyroxine 100 MCG tablet Commonly known as:  SYNTHROID, LEVOTHROID Take 100 mcg by mouth daily before breakfast. Except saturdays   levothyroxine 112 MCG tablet Commonly known as:  SYNTHROID, LEVOTHROID Take 112 mcg by mouth as directed. On  Sundays only   meclizine 12.5 MG tablet Commonly known as:  ANTIVERT Take 12.5 mg by mouth 3 (three) times daily as needed for dizziness.   trimethoprim-polymyxin b ophthalmic solution Commonly known as:  POLYTRIM Place 1 drop into both eyes 4 (four) times daily.   valACYclovir 500 MG tablet Commonly known as:  VALTREX Take 500 mg by mouth at bedtime.   VITAMIN B1 PO Take 1 tablet by mouth daily.   ziprasidone 80 MG capsule Commonly known as:  GEODON Take 160 mg by mouth at bedtime.   ziprasidone 40 MG capsule Commonly known as:  GEODON Take 80 mg by mouth 2 (two) times daily with a meal.       Allergies  Allergen Reactions  . Olanzapine Other (See Comments)    Swelling of the legs    Consultations: None  Procedures/Studies: Dg Chest 2 View  Result Date: 11/02/2017 CLINICAL DATA:  67 y/o F; positive flu test. Shortness of breath and weakness. EXAM: CHEST - 2 VIEW COMPARISON:  None. FINDINGS: Normal cardiac silhouette. Patchy consolidations in the upper lobes bilaterally. Diffuse increased reticular opacities of the lungs. No pleural effusion or pneumothorax. No acute osseous abnormality is evident. IMPRESSION: Patchy consolidations in the upper lobes bilaterally, likely pneumonia. Electronically Signed   By: Mitzi Hansen M.D.   On: 11/02/2017 14:37     Subjective: No new complaints.  Discharge Exam: Vitals:   11/05/17 0727 11/05/17 0800  BP:    Pulse:  62  Resp:    Temp:    SpO2: 97%    Vitals:   11/04/17 2100 11/05/17 0556 11/05/17 0727 11/05/17 0800  BP: (!) 149/65 122/67    Pulse: 75 (!) 54  62  Resp: 17 16    Temp: 97.9 F (36.6 C) 98.3 F (36.8 C)    TempSrc: Oral Oral    SpO2: 91% 92% 97%   Weight:  69.4 kg      General: Pt is alert, awake, not in acute distress Cardiovascular: RRR, S1/S2 +, no rubs, no gallops Respiratory: CTA bilaterally, no wheezing, no rhonchi Abdominal: Soft, NT, ND, bowel sounds + Extremities: no edema,  no cyanosis    The results of significant diagnostics from this hospitalization (including imaging, microbiology, ancillary and laboratory) are listed below for reference.     Microbiology: No results found for this or any previous visit (from the past 240 hour(s)).   Labs: BNP (last 3 results) No results for input(s): BNP in the last 8760 hours. Basic Metabolic Panel: Recent Labs  Lab 11/02/17 1230 11/02/17 1800 11/03/17 0639  NA 139  --  138  K 4.6  --  3.9  CL 102  --  101  CO2 28  --  26  GLUCOSE 107*  --  86  BUN 16  --  12  CREATININE 1.07* 1.12* 0.95  CALCIUM 9.1  --  8.5*   Liver Function Tests: No results for input(s): AST, ALT, ALKPHOS, BILITOT, PROT, ALBUMIN in the last 168 hours. No results for input(s): LIPASE, AMYLASE in the last 168 hours. No  results for input(s): AMMONIA in the last 168 hours. CBC: Recent Labs  Lab 11/02/17 1230 11/02/17 1800 11/03/17 0639  WBC 11.6* 12.2* 10.1  NEUTROABS 8.4*  --   --   HGB 11.8* 11.6* 11.2*  HCT 36.0 35.8* 34.8*  MCV 97.0 98.1 98.9  PLT 300 314 280   Cardiac Enzymes: No results for input(s): CKTOTAL, CKMB, CKMBINDEX, TROPONINI in the last 168 hours. BNP: Invalid input(s): POCBNP CBG: Recent Labs  Lab 11/03/17 0750 11/04/17 0758 11/05/17 0748  GLUCAP 82 96 85   D-Dimer No results for input(s): DDIMER in the last 72 hours. Hgb A1c No results for input(s): HGBA1C in the last 72 hours. Lipid Profile No results for input(s): CHOL, HDL, LDLCALC, TRIG, CHOLHDL, LDLDIRECT in the last 72 hours. Thyroid function studies Recent Labs    11/02/17 1800  TSH 3.097   Anemia work up No results for input(s): VITAMINB12, FOLATE, FERRITIN, TIBC, IRON, RETICCTPCT in the last 72 hours. Urinalysis No results found for: COLORURINE, APPEARANCEUR, LABSPEC, PHURINE, GLUCOSEU, HGBUR, BILIRUBINUR, KETONESUR, PROTEINUR, UROBILINOGEN, NITRITE, LEUKOCYTESUR Sepsis Labs Invalid input(s): PROCALCITONIN,  WBC,   LACTICIDVEN Microbiology No results found for this or any previous visit (from the past 240 hour(s)).   Time coordinating discharge: 40 minutes  SIGNED:   Marinda Elk, MD  [facility] 11/05/2017, 9:05 AM Pager   If 7PM-7AM, please contact night-coverage [email or web address] Password [email or web address]

## 2017-11-05 NOTE — Care Management Note (Signed)
Case Management Note  Patient Details  Name: VENEZIA SARGEANT MRN: [id number] Date of Birth: 1950/11/29  Subjective/Objective:                  Discharged to snf  Action/Plan: No cm needs present  Expected Discharge Date:  11/05/17               Expected Discharge Plan:  Skilled Nursing Facility  In-House Referral:  Clinical Social Work  Discharge planning Services  CM Consult  Post Acute Care Choice:    Choice offered to:     DME Arranged:    DME Agency:     HH Arranged:    HH Agency:     Status of Service:  Completed, signed off  If discussed at [employer], dates discussed:    Additional Comments:  Golda Acre, RN 11/05/2017, 9:10 AM

## 2017-11-05 NOTE — Clinical Social Work Placement (Signed)
Patient discharging to [facility]. CSW confirmed bed availability and faxed appropriate documents. Patient and spouse, [name], aware of discharge. Spouse will transport patient to facility.  RN call report: [phone]  CLINICAL SOCIAL WORK PLACEMENT  NOTE  Date:  11/05/2017  Patient Details  Name: Shelley Clarke MRN: [id number] Date of Birth: Sep 21, 1950  Clinical Social Work is seeking post-discharge placement for this patient at the Skilled  Nursing Facility level of care (*CSW will initial, date and re-position this form in  chart as items are completed):  Yes   Patient/family provided with [facility] Clinical Social Work Department's list of facilities offering this level of care within the geographic area requested by the patient (or if unable, by the patient's family).  Yes   Patient/family informed of their freedom to choose among providers that offer the needed level of care, that participate in Medicare, Medicaid or managed care program needed by the patient, have an available bed and are willing to accept the patient.  Yes   Patient/family informed of [facility]'s ownership interest in [facility] and [facility], as well as of the fact that they are under no obligation to receive care at these facilities.  PASRR submitted to EDS on       PASRR number received on 11/05/17     Existing PASRR number confirmed on       FL2 transmitted to all facilities in geographic area requested by pt/family on       FL2 transmitted to all facilities within larger geographic area on 11/04/17     Patient informed that his/her managed care company has contracts with or will negotiate with certain facilities, including the following:        Yes   Patient/family informed of bed offers received.  Patient chooses bed at [facility]     Physician recommends and patient chooses bed at      Patient to be transferred to [facility] on  11/05/17.  Patient to be transferred to facility by Spouse     Patient family notified on 11/05/17 of transfer.  Name of family member notified:  Charlestine Massed, spouse     PHYSICIAN       Additional Comment:    _______________________________________________ Enid Cutter, LCSWA 11/05/2017, 3:59 PM

## 2017-11-05 NOTE — Progress Notes (Addendum)
Patient has discharged to SNF for rehab on 11/05/17. Waiting for bed offer. Discharge instruction including medication and appointment was given to patient and family member at bedside. Patient has no question at this time.

## 2017-11-10 ENCOUNTER — Encounter (HOSPITAL_COMMUNITY): Payer: Self-pay | Admitting: *Deleted

## 2017-11-10 ENCOUNTER — Other Ambulatory Visit: Payer: Self-pay

## 2017-11-10 ENCOUNTER — Emergency Department (HOSPITAL_COMMUNITY): Payer: 59

## 2017-11-10 ENCOUNTER — Emergency Department (HOSPITAL_COMMUNITY)
Admission: EM | Admit: 2017-11-10 | Discharge: 2017-11-10 | Disposition: A | Discharge status: Home / Self Care | Payer: 59 | Attending: Emergency Medicine | Admitting: Emergency Medicine

## 2017-11-10 DIAGNOSIS — R079 Chest pain, unspecified: Secondary | ICD-10-CM | POA: Insufficient documentation

## 2017-11-10 DIAGNOSIS — F329 Major depressive disorder, single episode, unspecified: Secondary | ICD-10-CM | POA: Insufficient documentation

## 2017-11-10 DIAGNOSIS — Z79899 Other long term (current) drug therapy: Secondary | ICD-10-CM | POA: Insufficient documentation

## 2017-11-10 DIAGNOSIS — N183 Chronic kidney disease, stage 3 (moderate): Secondary | ICD-10-CM | POA: Insufficient documentation

## 2017-11-10 LAB — BASIC METABOLIC PANEL WITH GFR
Anion gap: 8 (ref 5–15)
BUN: 13 mg/dL (ref 8–23)
CO2: 28 mmol/L (ref 22–32)
Calcium: 9.3 mg/dL (ref 8.9–10.3)
Chloride: 102 mmol/L (ref 98–111)
Creatinine, Ser: 1.33 mg/dL — ABNORMAL HIGH (ref 0.44–1.00)
GFR calc Af Amer: 47 mL/min — ABNORMAL LOW
GFR calc non Af Amer: 41 mL/min — ABNORMAL LOW
Glucose, Bld: 81 mg/dL (ref 70–99)
Potassium: 4.5 mmol/L (ref 3.5–5.1)
Sodium: 138 mmol/L (ref 135–145)

## 2017-11-10 LAB — CBC
HCT: 35.2 % — ABNORMAL LOW (ref 36.0–46.0)
Hemoglobin: 11.1 g/dL — ABNORMAL LOW (ref 12.0–15.0)
MCH: 31.2 pg (ref 26.0–34.0)
MCHC: 31.5 g/dL (ref 30.0–36.0)
MCV: 98.9 fL (ref 80.0–100.0)
Platelets: 492 K/uL — ABNORMAL HIGH (ref 150–400)
RBC: 3.56 MIL/uL — ABNORMAL LOW (ref 3.87–5.11)
RDW: 13.4 % (ref 11.5–15.5)
WBC: 7 K/uL (ref 4.0–10.5)
nRBC: 0 % (ref 0.0–0.2)

## 2017-11-10 LAB — TROPONIN I
Troponin I: <0.03 ng/mL
Troponin I: <0.03 ng/mL

## 2017-11-10 NOTE — ED Provider Notes (Signed)
[facility] EMERGENCY DEPARTMENT Provider Note   CSN: [id number] Arrival date & time: 11/10/17  0719     History   Chief Complaint Chief Complaint  Patient presents with  . Chest Pain    HPI Shelley Clarke is a 67 y.o. female.  HPI She presents to the emergency room for evaluation of chest pain.  Patient states she had sudden onset of chest discomfort earlier this morning.  Patient's unable to tell me exactly when it started.  She felt a severe pressure type discomfort in the center of her chest.  She denied being any issues with shortness of breath or nausea.  She currently resides at a nursing facility and they gave her aspirin and nitroglycerin.  Patient states her symptoms have resolved.  Patient was also noted to be bradycardic so she was sent to the ED for further evaluation.  Patient denies having any history of heart disease.  Her father did die of a heart attack in his 24s.  Pt was recently in the hospital admitted for pneumonia.  She was in from 10/22 to 10/25. Past Medical History:  Diagnosis Date  . Bipolar disorder (HCC)   . Depression   . Hypothyroidism     Patient Active Problem List   Diagnosis Date Noted  . CAP (community acquired pneumonia) 11/02/2017  . Influenza A 11/02/2017  . Acute respiratory failure with hypoxia (HCC) 11/02/2017  . CKD (chronic kidney disease) stage 3, GFR 30-59 ml/min (HCC) 11/02/2017  . Bipolar disorder (HCC) 11/02/2017  . Anxiety 04/15/2015  . Depression 04/15/2015  . Hypothyroidism 04/15/2015    History reviewed. No pertinent surgical history.   OB History   None      Home Medications    Prior to Admission medications   Medication Sig Start Date End Date Taking? Authorizing Provider  aspirin EC 81 MG tablet Take 81 mg by mouth.   Yes [provider]  Biotin 1000 MCG tablet Take 1,000 mcg by mouth daily.   Yes [provider]  buPROPion (WELLBUTRIN XL) 300 MG 24 hr tablet Take 300  mg by mouth daily.    Yes [provider]  calcium carbonate (CALCIUM 600) 600 MG TABS tablet Take 600 mg by mouth 2 (two) times daily with a meal.   Yes [provider]  clonazePAM (KLONOPIN) 0.5 MG tablet Take 0.5-1 mg by mouth See admin instructions. Take 0.5 mg 08:00, 1230 and 1 mg at 2000 02/11/15  Yes [provider]  FLUoxetine (PROZAC) 40 MG capsule Take 2 every morning Patient taking differently: Take 80 mg by mouth daily.  10/13/17  Yes Hurst, Rosey Bath T, PA-C  HYDROcodone-homatropine (HYCODAN) 5-1.5 MG/5ML syrup Take 5 mLs by mouth 2 (two) times daily. 10/30/17  Yes [provider]  lamoTRIgine (LAMICTAL) 150 MG tablet Take 300 mg by mouth at bedtime.  04/03/15  Yes [provider]  levothyroxine (SYNTHROID, LEVOTHROID) 100 MCG tablet Take 100 mcg by mouth See admin instructions. Take Monday - Saturday   Yes [provider]  levothyroxine (SYNTHROID, LEVOTHROID) 112 MCG tablet Take 112 mcg by mouth See admin instructions. Take Monday -Saturday   Yes [provider]  meclizine (ANTIVERT) 12.5 MG tablet Take 12.5 mg by mouth 3 (three) times daily as needed for dizziness.   Yes [provider]  Thiamine HCl (VITAMIN B1 PO) Take 1 tablet by mouth daily.   Yes [provider]  valACYclovir (VALTREX) 500 MG tablet Take 500 mg by  mouth at bedtime.    Yes [provider]  ziprasidone (GEODON) 40 MG capsule Take 40 mg by mouth 2 (two) times daily with a meal. Take at 0800 and 1430   Yes [provider]  ziprasidone (GEODON) 80 MG capsule Take 160 mg by mouth at bedtime.    Yes [provider]  amoxicillin-clavulanate (AUGMENTIN) 875-125 MG tablet Take 1 tablet by mouth 2 (two) times daily with a meal. Patient not taking: Reported on 11/10/2017 11/05/17   Marinda Elk, MD  doxycycline (VIBRA-TABS) 100 MG tablet Take 1 tablet (100 mg total) by mouth 2 (two) times daily. Patient not taking:  Reported on 11/10/2017 11/05/17   Marinda Elk, MD  ziprasidone (GEODON) 40 MG capsule Take 80 mg by mouth 2 (two) times daily with a meal.  03/27/15   [provider]    Family History Family History  Problem Relation Age of Onset  . Breast cancer Mother 44  . Hypertension Mother   . Diabetes Mother   . Graves' disease Sister     Social History Social History   Tobacco Use  . Smoking status: Never Smoker  . Smokeless tobacco: Never Used  Substance Use Topics  . Alcohol use: Not Currently  . Drug use: Not on file     Allergies   Patient has no known allergies.   Review of Systems Review of Systems  All other systems reviewed and are negative.    Physical Exam Updated Vital Signs BP 112/75   Pulse (!) 59   Temp 98.8 F (37.1 C) (Oral)   Resp 19   SpO2 97%   Physical Exam  Constitutional: She appears well-developed and well-nourished. No distress.  HENT:  Head: Normocephalic and atraumatic.  Right Ear: External ear normal.  Left Ear: External ear normal.  Eyes: Conjunctivae are normal. Right eye exhibits no discharge. Left eye exhibits no discharge. No scleral icterus.  Neck: Neck supple. No tracheal deviation present.  Cardiovascular: Normal rate, regular rhythm and intact distal pulses.  Pulmonary/Chest: Effort normal and breath sounds normal. No stridor. No respiratory distress. She has no wheezes. She has no rales.  Abdominal: Soft. Bowel sounds are normal. She exhibits no distension. There is no tenderness. There is no rebound and no guarding.  Musculoskeletal: She exhibits no edema or tenderness.  Neurological: She is alert. She has normal strength. No cranial nerve deficit (no facial droop, extraocular movements intact, no slurred speech) or sensory deficit. She exhibits normal muscle tone. She displays no seizure activity. Coordination normal.  Skin: Skin is warm and dry. No rash noted.  Psychiatric: She has a normal mood and affect.    Nursing note and vitals reviewed.    ED Treatments / Results  Labs (all labs ordered are listed, but only abnormal results are displayed) Labs Reviewed  BASIC METABOLIC PANEL - Abnormal; Notable for the following components:      Result Value   Creatinine, Ser 1.33 (*)    GFR calc non Af Amer 41 (*)    GFR calc Af Amer 47 (*)    All other components within normal limits  CBC - Abnormal; Notable for the following components:   RBC 3.56 (*)    Hemoglobin 11.1 (*)    HCT 35.2 (*)    Platelets 492 (*)    All other components within normal limits  TROPONIN I  TROPONIN I    EKG EKG Interpretation  Date/Time:  Wednesday November 10 2017 07:34:48 EDT  Ventricular Rate:  48 PR Interval:    QRS Duration: 87 QT Interval:  380 QTC Calculation: 340 R Axis:   74 Text Interpretation:  Sinus bradycardia (less than 50 BPm) Since last tracing rate slower Poor data quality Confirmed by Linwood Dibbles [phone]) on 11/10/2017 7:47:41 AM   Radiology Dg Chest 2 View  Result Date: 11/10/2017 CLINICAL DATA:  Chest pain.  Recent pneumonia EXAM: CHEST - 2 VIEW COMPARISON:  November 02, 2017 FINDINGS: Previously noted consolidation in the right upper lobe has cleared. There is mild scarring in each upper lobe. Currently there is no edema or consolidation. Heart size and pulmonary vascularity are normal. No adenopathy. No bone lesions. IMPRESSION: Mild upper lobe scarring bilaterally. Interval clearing of infiltrate from the right upper lobe. Currently no edema or consolidation. Stable cardiac silhouette. No evident adenopathy. Electronically Signed   By: Bretta Bang III M.D.   On: 11/10/2017 09:38    Procedures Procedures (including critical care time)  Medications Ordered in ED Medications - No data to display   Initial Impression / Assessment and Plan / ED Course  I have reviewed the triage vital signs and the nursing notes.  Pertinent labs & imaging results that were available during my  care of the patient were reviewed by me and considered in my medical decision making (see chart for details).  Clinical Course as of Nov 10 1517  Wed Nov 10, 2017  0909 Hemoglobin stable compared to previous values  CBC(!) [JK]  0910 Creatinine increased compared to previous.  Doubt clinically significant.  Basic metabolic panel(!) [JK]  [id number] CXR improved from previous   [JK]  1240 Moderate risk heart score of 4   [JK]    Clinical Course User Index [JK] Linwood Dibbles, MD    Patient presented to the emergency room with chest pain.  Patient has no cardiac risk factors with the exception of family history.  Patient's moderate risk heart score.  Discussed case with cardiology.  They agree with close outpatient follow-up.  I discussed the findings and the plan with the patient and family and they are comfortable with close outpatient follow-up.  Patient will follow-up in the cardiology office next week for stress testing.  Warning signs precautions discussed.  Final Clinical Impressions(s) / ED Diagnoses   Final diagnoses:  Chest pain, unspecified type    ED Discharge Orders    None       Linwood Dibbles, MD 11/10/17 [phone]

## 2017-11-10 NOTE — ED Notes (Signed)
Patient verbalizes understanding of discharge instructions. Opportunity for questioning and answers were provided. Armband removed by staff, pt discharged from ED.  

## 2017-11-10 NOTE — ED Notes (Signed)
Husband at bedside, Dr. Lynelle Doctor ok'd for husband to give patient her home bipolar meds. Patient still denies chest pain

## 2017-11-10 NOTE — Discharge Instructions (Addendum)
Follow up with the cardiologist as discussed, follow up with your primary doctor routinely

## 2017-11-10 NOTE — ED Triage Notes (Signed)
Patient  Presents to ED via [facility] c/o chest pain ems states patient was given ASA and NTG at NH with pain relief, didn't want to come however ems was concerned because her heart rate was in the higher 40's. Patient is currently painfree.

## 2017-11-15 ENCOUNTER — Encounter: Payer: Self-pay | Admitting: Cardiovascular Disease

## 2017-11-15 ENCOUNTER — Ambulatory Visit (INDEPENDENT_AMBULATORY_CARE_PROVIDER_SITE_OTHER): Payer: 59 | Admitting: Cardiovascular Disease

## 2017-11-15 VITALS — BP 130/64 | HR 59 | Ht 64.0 in | Wt 132.6 lb

## 2017-11-15 DIAGNOSIS — R0789 Other chest pain: Secondary | ICD-10-CM | POA: Diagnosis not present

## 2017-11-15 HISTORY — DX: Other chest pain: R07.89

## 2017-11-15 NOTE — Patient Instructions (Signed)
Medication Instructions:  Your physician recommends that you continue on your current medications as directed. Please refer to the Current Medication list given to you today.  If you need a refill on your cardiac medications before your next appointment, please call your pharmacy.   Lab work: NONE  Testing/Procedures: Your physician has requested that you have a lexiscan myoview. For further information please visit [email or web address] Please follow instruction sheet, as given.   Follow-Up: AS NEEDED  Any Other Special Instructions Will Be Listed Below (If Applicable).  Cardiac Nuclear Scan A cardiac nuclear scan is a test that measures blood flow to the heart when a person is resting and when he or she is exercising. The test looks for problems such as:  Not enough blood reaching a portion of the heart.  The heart muscle not working normally.  You may need this test if:  You have heart disease.  You have had abnormal lab results.  You have had heart surgery or angioplasty.  You have chest pain.  You have shortness of breath.  In this test, a radioactive dye (tracer) is injected into your bloodstream. After the tracer has traveled to your heart, an imaging device is used to measure how much of the tracer is absorbed by or distributed to various areas of your heart. This procedure is usually done at a hospital and takes 2-4 hours. Tell a health care provider about:  Any allergies you have.  All medicines you are taking, including vitamins, herbs, eye drops, creams, and over-the-counter medicines.  Any problems you or family members have had with the use of anesthetic medicines.  Any blood disorders you have.  Any surgeries you have had.  Any medical conditions you have.  Whether you are pregnant or may be pregnant. What are the risks? Generally, this is a safe procedure. However, problems may occur, including:  Serious chest pain and heart attack. This is only a  risk if the stress portion of the test is done.  Rapid heartbeat.  Sensation of warmth in your chest. This usually passes quickly.  What happens before the procedure?  Ask your health care provider about changing or stopping your regular medicines. This is especially important if you are taking diabetes medicines or blood thinners.  Remove your jewelry on the day of the procedure. What happens during the procedure?  An IV tube will be inserted into one of your veins.  Your health care provider will inject a small amount of radioactive tracer through the tube.  You will wait for 20-40 minutes while the tracer travels through your bloodstream.  Your heart activity will be monitored with an electrocardiogram (ECG).  You will lie down on an exam table.  Images of your heart will be taken for about 15-20 minutes.  You may be asked to exercise on a treadmill or stationary bike. While you exercise, your heart's activity will be monitored with an ECG, and your blood pressure will be checked. If you are unable to exercise, you may be given a medicine to increase blood flow to parts of your heart.  When blood flow to your heart has peaked, a tracer will again be injected through the IV tube.  After 20-40 minutes, you will get back on the exam table and have more images taken of your heart.  When the procedure is over, your IV tube will be removed. The procedure may vary among health care providers and hospitals. Depending on the type of tracer used, scans may  need to be repeated 3-4 hours later. What happens after the procedure?  Unless your health care provider tells you otherwise, you may return to your normal schedule, including diet, activities, and medicines.  Unless your health care provider tells you otherwise, you may increase your fluid intake. This will help flush the contrast dye from your body. Drink enough fluid to keep your urine clear or pale yellow.  It is up to you to  get your test results. Ask your health care provider, or the department that is doing the test, when your results will be ready. Summary  A cardiac nuclear scan measures the blood flow to the heart when a person is resting and when he or she is exercising.  You may need this test if you are at risk for heart disease.  Tell your health care provider if you are pregnant.  Unless your health care provider tells you otherwise, increase your fluid intake. This will help flush the contrast dye from your body. Drink enough fluid to keep your urine clear or pale yellow. This information is not intended to replace advice given to you by your health care provider. Make sure you discuss any questions you have with your health care provider. Document Released: 01/24/2004 Document Revised: 01/01/2016 Document Reviewed: 12/07/2012 Elsevier Interactive Patient Education  2017 [employer].

## 2017-11-15 NOTE — Progress Notes (Signed)
Cardiology Office Note   Date:  11/15/2017   ID:  Shelley, Clarke 05-26-1950, MRN [id number]  PCP:  Richmond Campbell., PA-C  Cardiologist:   Chilton Si, MD   No chief complaint on file.    History of Present Illness: Shelley Clarke is a 67 y.o. female with bipolar disorder, CKD 3 who is being seen today for the evaluation of chest pain at the request of Richmond Campbell., PA-C.  Shelley Clarke was seen in the ED 12/30 with sudden onset of chest pain.  She was lying in bed 10/30 and woke up with sudden substernal ches pain.  There is no associated shortness of breath, nausea, or diaphoresis.  The pain was 10 out of 10 in severity and radiated across her chest.  She also felt some mild left arm discomfort.  She currently lives in [facility] facility and they administered aspirin and nitroglycerin. This helped alleviate her symptoms before EMS arrived. She was also noted to be bradycardic.  In the ED cardiac enzymes remained negative.  EKG revealed sinus bradycardia at 48 bpm but was otherwise unremarkable.  She was discharged with plans for outpatient cardiology assessment.  Since being discharged she has not had any recurrent symptoms.  She does report occasional episodes of GERD, though this is very rare.  She had to take some Tums 2 weeks ago.  She was recently admitted to the hospital 12/22 through 12/25 with pneumonia.  She was discharged to the nursing facility.  She has been doing physical therapy.  This week she walked and used the recumbent bike without any chest pain.  Of note her father had a heart attack in his 35s.   Past Medical History:  Diagnosis Date  . Atypical chest pain 11/15/2017  . Bipolar disorder (HCC)   . Depression   . Hypothyroidism     History reviewed. No pertinent surgical history.   Current Outpatient Medications  Medication Sig Dispense Refill  . aspirin EC 81 MG tablet Take 81 mg by mouth.    . Biotin 1000 MCG tablet Take 1,000 mcg by  mouth daily.    Marland Kitchen buPROPion (WELLBUTRIN XL) 300 MG 24 hr tablet Take 300 mg by mouth daily.     . calcium carbonate (CALCIUM 600) 600 MG TABS tablet Take 600 mg by mouth 2 (two) times daily with a meal.    . clonazePAM (KLONOPIN) 0.5 MG tablet Take 0.5-1 mg by mouth See admin instructions. Take 0.5 mg 08:00, 1230 and 1 mg at 2000    . FLUoxetine (PROZAC) 40 MG capsule Take 2 every morning (Patient taking differently: Take 80 mg by mouth daily. ) 180 capsule 3  . HYDROcodone-homatropine (HYCODAN) 5-1.5 MG/5ML syrup Take 5 mLs by mouth 2 (two) times daily.    Marland Kitchen lamoTRIgine (LAMICTAL) 150 MG tablet Take 300 mg by mouth at bedtime.     Marland Kitchen levothyroxine (SYNTHROID, LEVOTHROID) 100 MCG tablet Take 100 mcg by mouth See admin instructions. Take Monday - Saturday    . levothyroxine (SYNTHROID, LEVOTHROID) 112 MCG tablet Take 112 mcg by mouth See admin instructions. Take Monday -Saturday    . Thiamine HCl (VITAMIN B1 PO) Take 1 tablet by mouth daily.    . valACYclovir (VALTREX) 500 MG tablet Take 500 mg by mouth at bedtime.     . ziprasidone (GEODON) 40 MG capsule Take 80 mg by mouth 2 (two) times daily with a meal.     . ziprasidone (GEODON) 80  MG capsule Take 160 mg by mouth at bedtime.      No current facility-administered medications for this visit.     Allergies:   Patient has no known allergies.    Social History:  The patient  reports that she has never smoked. She has never used smokeless tobacco. She reports that she drank alcohol.   Family History:  The patient's family history includes Brain cancer in her maternal grandmother; Breast cancer (age of onset: 61) in her mother; Diabetes in her mother; Endometriosis in her child; Luiz Blare' disease in her sister; Heart attack in her father; Hypertension in her child and mother; Kidney disease in her maternal grandfather; Stroke in her paternal grandmother.    ROS:  Please see the history of present illness.   Otherwise, review of systems are  positive for none.   All other systems are reviewed and negative.    PHYSICAL EXAM: VS:  BP 130/64   Pulse (!) 59   Ht 5\' 4"  (1.626 m)   Wt 132 lb 9.6 oz (60.1 kg)   BMI 22.76 kg/m  , BMI Body mass index is 22.76 kg/m. GENERAL:  Well appearing HEENT:  Pupils equal round and reactive, fundi not visualized, oral mucosa unremarkable NECK:  No jugular venous distention, waveform within normal limits, carotid upstroke brisk and symmetric, no bruits, no thyromegaly LYMPHATICS:  No cervical adenopathy LUNGS:  Clear to auscultation bilaterally HEART:  RRR.  PMI not displaced or sustained,S1 and S2 within normal limits, no S3, no S4, no clicks, no rubs, no murmurs ABD:  Flat, positive bowel sounds normal in frequency in pitch, no bruits, no rebound, no guarding, no midline pulsatile mass, no hepatomegaly, no splenomegaly EXT:  2 plus pulses throughout, no edema, no cyanosis no clubbing SKIN:  No rashes no nodules NEURO:  Cranial nerves II through XII grossly intact, motor grossly intact throughout PSYCH:  Cognitively intact, oriented to person place and time   EKG:  EKG is not ordered today. The ekg ordered 11/10/17 demonstrates sinus bradycardia.  Rate 47 bpm.    Recent Labs: 11/02/2017: TSH 3.097 11/10/2017: BUN 13; Creatinine, Ser 1.33; Hemoglobin 11.1; Platelets 492; Potassium 4.5; Sodium 138    Lipid Panel No results found for: CHOL, TRIG, HDL, CHOLHDL, VLDL, LDLCALC, LDLDIRECT    Wt Readings from Last 3 Encounters:  11/15/17 132 lb 9.6 oz (60.1 kg)  11/05/17 153 lb (69.4 kg)      ASSESSMENT AND PLAN:  # Atypical Chest pain: Shelley Clarke' symptoms are atypical.  I suspect that it may be related to GERD.  She does have a family history of CAD but no other significant risk factors.  We will get a Lexiscan Myoview to make sure we aren't missing atypical symptoms.  She walks with a walker and wouldn't be able to walk on a treadmill.     Current medicines are reviewed at length  with the patient today.  The patient does not have concerns regarding medicines.  The following changes have been made:  no change  Labs/ tests ordered today include:   Orders Placed This Encounter  Procedures  . MYOCARDIAL PERFUSION IMAGING     Disposition:   FU with Dmitry Macomber C. Duke Salvia, MD, [facility] as needed.      Signed, Clydine Parkison C. Duke Salvia, MD, [facility]  11/15/2017 2:49 PM    [facility]

## 2017-11-15 NOTE — Addendum Note (Signed)
Addended by: Regis Bill B on: 11/15/2017 04:49 PM   Modules accepted: Orders

## 2017-11-18 ENCOUNTER — Telehealth (HOSPITAL_COMMUNITY): Payer: Self-pay

## 2017-11-18 NOTE — Telephone Encounter (Signed)
Encounter complete. 

## 2017-11-19 ENCOUNTER — Telehealth (HOSPITAL_COMMUNITY): Payer: Self-pay

## 2017-11-19 NOTE — Telephone Encounter (Signed)
Encounter complete. 

## 2017-11-23 ENCOUNTER — Ambulatory Visit (HOSPITAL_COMMUNITY)
Admission: RE | Admit: 2017-11-23 | Discharge: 2017-11-23 | Disposition: A | Discharge status: Home / Self Care | Payer: 59 | Source: Ambulatory Visit | Attending: Internal Medicine | Admitting: Internal Medicine

## 2017-11-23 DIAGNOSIS — R0789 Other chest pain: Secondary | ICD-10-CM | POA: Insufficient documentation

## 2017-11-23 LAB — MYOCARDIAL PERFUSION IMAGING
CHL CUP NUCLEAR SDS: 5
CHL CUP NUCLEAR SRS: 3
CHL CUP NUCLEAR SSS: 8
CSEPPHR: 75 {beats}/min
LV dias vol: 105 mL (ref 46–106)
LV sys vol: 41 mL
Rest HR: 51 {beats}/min
TID: 1.01

## 2017-11-23 MED ORDER — TECHNETIUM TC 99M TETROFOSMIN IV KIT
10.8000 | PACK | Freq: Once | INTRAVENOUS | Status: AC | PRN
  Administered 2017-11-23: 10.8 via INTRAVENOUS
  Filled 2017-11-23: qty 11

## 2017-11-23 MED ORDER — TECHNETIUM TC 99M TETROFOSMIN IV KIT
32.4000 | PACK | Freq: Once | INTRAVENOUS | Status: AC | PRN
  Administered 2017-11-23: 32.4 via INTRAVENOUS
  Filled 2017-11-23: qty 33

## 2017-11-23 MED ORDER — REGADENOSON 0.4 MG/5ML IV SOLN
0.4000 mg | Freq: Once | INTRAVENOUS | Status: AC
  Administered 2017-11-23: 0.4 mg via INTRAVENOUS

## 2017-11-26 ENCOUNTER — Telehealth: Payer: Self-pay | Admitting: *Deleted

## 2017-11-26 NOTE — Telephone Encounter (Signed)
-----   Message from Chilton Siiffany [place], MD sent at 11/24/2017 10:20 AM EST ----- Normal stress test.

## 2017-11-26 NOTE — Telephone Encounter (Signed)
Left message to call back  

## 2017-11-26 NOTE — Telephone Encounter (Signed)
Advised patient of results.  

## 2017-12-15 ENCOUNTER — Other Ambulatory Visit: Payer: Self-pay | Admitting: Family Medicine

## 2017-12-15 DIAGNOSIS — Z1231 Encounter for screening mammogram for malignant neoplasm of breast: Secondary | ICD-10-CM

## 2017-12-28 ENCOUNTER — Encounter: Payer: Self-pay | Admitting: Emergency Medicine

## 2017-12-28 DIAGNOSIS — F3132 Bipolar disorder, current episode depressed, moderate: Secondary | ICD-10-CM | POA: Insufficient documentation

## 2017-12-28 DIAGNOSIS — F411 Generalized anxiety disorder: Secondary | ICD-10-CM

## 2017-12-28 DIAGNOSIS — F429 Obsessive-compulsive disorder, unspecified: Secondary | ICD-10-CM | POA: Insufficient documentation

## 2017-12-29 ENCOUNTER — Other Ambulatory Visit: Payer: Self-pay | Admitting: Family Medicine

## 2017-12-29 ENCOUNTER — Ambulatory Visit
Admission: RE | Admit: 2017-12-29 | Discharge: 2017-12-29 | Disposition: A | Discharge status: Home / Self Care | Payer: 59 | Source: Ambulatory Visit | Attending: Family Medicine | Admitting: Family Medicine

## 2017-12-29 DIAGNOSIS — J189 Pneumonia, unspecified organism: Secondary | ICD-10-CM

## 2018-01-18 ENCOUNTER — Encounter: Payer: Self-pay | Admitting: Physician Assistant

## 2018-01-18 ENCOUNTER — Ambulatory Visit (INDEPENDENT_AMBULATORY_CARE_PROVIDER_SITE_OTHER): Payer: 59 | Admitting: Physician Assistant

## 2018-01-18 DIAGNOSIS — F319 Bipolar disorder, unspecified: Secondary | ICD-10-CM

## 2018-01-18 DIAGNOSIS — F422 Mixed obsessional thoughts and acts: Secondary | ICD-10-CM | POA: Diagnosis not present

## 2018-01-18 DIAGNOSIS — F411 Generalized anxiety disorder: Secondary | ICD-10-CM

## 2018-01-18 MED ORDER — ZIPRASIDONE HCL 80 MG PO CAPS: 160.0000 mg | ORAL_CAPSULE | Freq: Every day | ORAL | 1 refills | Status: DC

## 2018-01-18 MED ORDER — BUPROPION HCL ER (XL) 300 MG PO TB24: 300.0000 mg | ORAL_TABLET | Freq: Every day | ORAL | 1 refills | Status: DC

## 2018-01-18 MED ORDER — LAMOTRIGINE 150 MG PO TABS: 300.0000 mg | ORAL_TABLET | Freq: Every day | ORAL | 1 refills | Status: DC

## 2018-01-18 MED ORDER — ZIPRASIDONE HCL 40 MG PO CAPS: 40.0000 mg | ORAL_CAPSULE | Freq: Two times a day (BID) | ORAL | 1 refills | Status: DC

## 2018-01-18 MED ORDER — FLUOXETINE HCL 40 MG PO CAPS: 80.0000 mg | ORAL_CAPSULE | Freq: Every day | ORAL | 1 refills | Status: DC

## 2018-01-18 MED ORDER — CLONAZEPAM 0.5 MG PO TABS: 0.5000 mg | ORAL_TABLET | ORAL | 1 refills | Status: DC

## 2018-01-18 NOTE — Progress Notes (Signed)
[facility]  Patient ID: Shelley Clarke,  MRN: [id number]  PCP: Richmond Campbell., PA-C  Date of Evaluation: 01/19/2018 Time spent:25 minutes  Chief Complaint:  Chief Complaint    Follow-up      HISTORY/CURRENT STATUS: HPI Here for routine med check. Accompanied by husband, [name].  As far as mental health goes, she's okay.  Has been a tad bit sad after the holidays but she feels better now.  This is something that happens every year.  Her husband reports that her OCD is a little worse right now.  She has been obsessing about buying a hair dryer and has spent hours online comparing reviews and and after she bought 1 and it was defective she sent it back bought another when it was defective and she sent it back.  She has also been reorganizing her cross-stitch patterns, over and over and over.  She states that it is not worse than that.  She is not staying up during the night to do these things. Patient denies increased energy with decreased need for sleep, no increased talkativeness, no racing thoughts, no impulsivity or risky behaviors, no increased spending, no increased libido, no grandiosity.  Patient denies loss of interest in usual activities and is able to enjoy things.  Denies decreased energy or motivation.  Appetite has not changed.  No extreme sadness, tearfulness, or feelings of hopelessness.  Denies any changes in concentration, making decisions or remembering things.  Denies suicidal or homicidal thoughts.  Anxiety is controlled with Klonopin.   Individual Medical History/ Review of Systems: Changes? :Yes  Had Flu in Oct then followed by bilat. pneumonia in Oct. Was in hospital 4 days, then went to [facility] for rehab for 2 weeks.  After that had in home rehab and was released last week. Still doing exercises to help with strength and balance.  She denies tics or tremors.  She has been using a walker at home but now that she is stronger, she does not use it as  much.  She leans on her husband to help her walk today.  Past medications for mental health diagnoses include: Lithium, Geodon, Wellbutrin Lamictal, Prozac, Klonopin, Depakote, Xanax  Allergies: Patient has no known allergies.  Current Medications:  Current Outpatient Medications:  .  aspirin EC 81 MG tablet, Take 81 mg by mouth., Disp: , Rfl:  .  Biotin 1000 MCG tablet, Take 1,000 mcg by mouth daily., Disp: , Rfl:  .  buPROPion (WELLBUTRIN XL) 300 MG 24 hr tablet, Take 1 tablet (300 mg total) by mouth daily., Disp: 90 tablet, Rfl: 1 .  calcium carbonate (CALCIUM 600) 600 MG TABS tablet, Take 600 mg by mouth 2 (two) times daily with a meal., Disp: , Rfl:  .  clonazePAM (KLONOPIN) 0.5 MG tablet, Take 1-2 tablets (0.5-1 mg total) by mouth See admin instructions. Take 0.5 mg 08:00, 1230 and 1 mg at 2000, Disp: 360 tablet, Rfl: 1 .  FLUoxetine (PROZAC) 40 MG capsule, Take 2 capsules (80 mg total) by mouth daily., Disp: 180 capsule, Rfl: 1 .  lamoTRIgine (LAMICTAL) 150 MG tablet, Take 2 tablets (300 mg total) by mouth at bedtime., Disp: 180 tablet, Rfl: 1 .  levothyroxine (SYNTHROID, LEVOTHROID) 100 MCG tablet, Take 100 mcg by mouth See admin instructions. Take Monday - Saturday, Disp: , Rfl:  .  levothyroxine (SYNTHROID, LEVOTHROID) 112 MCG tablet, Take 112 mcg by mouth See admin instructions. Take Monday -Saturday, Disp: , Rfl:  .  valACYclovir (VALTREX)  500 MG tablet, Take 500 mg by mouth at bedtime. , Disp: , Rfl:  .  ziprasidone (GEODON) 40 MG capsule, Take 1 capsule (40 mg total) by mouth 2 (two) times daily with a meal. 1  A.m. and 1 at lunch., Disp: 180 capsule, Rfl: 1 .  ziprasidone (GEODON) 80 MG capsule, Take 2 capsules (160 mg total) by mouth at bedtime., Disp: 180 capsule, Rfl: 1 .  HYDROcodone-homatropine (HYCODAN) 5-1.5 MG/5ML syrup, Take 5 mLs by mouth 2 (two) times daily., Disp: , Rfl:  .  Thiamine HCl (VITAMIN B1 PO), Take 1 tablet by mouth daily., Disp: , Rfl:  Medication Side  Effects: none  Family Medical/ Social History: Changes? No  MENTAL HEALTH EXAM:  There were no vitals taken for this visit.There is no height or weight on file to calculate BMI.  General Appearance: Casual and Well Groomed  Eye Contact:  Good  Speech:  Clear and Coherent  Volume:  Normal  Mood:  Euthymic  Affect:  Appropriate  Thought Process:  Goal Directed  Orientation:  Full (Time, Place, and Person)  Thought Content: Logical   Suicidal Thoughts:  No  Homicidal Thoughts:  No  Memory:  WNL  Judgement:  Good  Insight:  Good  Psychomotor Activity:  Normal walking assistance by her husband.  She is obviously a little weak.  Concentration:  Concentration: Good  Recall:  Good  Fund of Knowledge: Good  Language: Good  Assets:  Desire for Improvement  ADL's:  Intact  Cognition: WNL  Prognosis:  Good    DIAGNOSES:    ICD-10-CM   1. Bipolar I disorder (HCC) F31.9   2. Generalized anxiety disorder F41.1   3. Mixed obsessional thoughts and acts F42.2   Recent hospitalization for pneumonia, [facility], now home receiving therapy through home health care.  Receiving Psychotherapy: No    RECOMMENDATIONS: I spent 25 minutes with her and 50% of that time was spent counseling.   Continue all current psych medications.   At this point she should monitor the OCD.  She and her husband will watch for any signs of worsening.  If that occurs, let me know.  "Right now it is tolerable and I can control it somewhat.  It is not affecting my day today living." Return in 3 months or sooner as needed.   Melony Overlyeresa Hurst, PA-C

## 2018-01-21 ENCOUNTER — Ambulatory Visit: Payer: 59

## 2018-01-21 ENCOUNTER — Ambulatory Visit
Admission: RE | Admit: 2018-01-21 | Discharge: 2018-01-21 | Disposition: A | Discharge status: Home / Self Care | Payer: 59 | Source: Ambulatory Visit | Attending: Family Medicine | Admitting: Family Medicine

## 2018-01-21 DIAGNOSIS — Z1231 Encounter for screening mammogram for malignant neoplasm of breast: Secondary | ICD-10-CM

## 2018-03-04 ENCOUNTER — Telehealth: Payer: Self-pay | Admitting: Physician Assistant

## 2018-03-04 NOTE — Telephone Encounter (Signed)
Would like to talk to you about going back to her original prozac dosage. Please return call.

## 2018-03-07 NOTE — Telephone Encounter (Signed)
Going to 80mg  of Prozac helped a lot last fall.  It's ok to decrease back to 60mg  but I'm not sure that's what's causing the memory/focus issues.  I'm glad she's seeing a Neuro.   Send in Prozac 60mg  OR 20mg , 3 qd.  Enough for 1 or 3 months, whatever she and ins prefer.

## 2018-03-08 ENCOUNTER — Other Ambulatory Visit: Payer: Self-pay

## 2018-03-08 MED ORDER — FLUOXETINE HCL 60 MG PO TABS: 60.0000 mg | ORAL_TABLET | Freq: Every day | ORAL | 0 refills | Status: DC

## 2018-03-08 NOTE — Telephone Encounter (Signed)
Pt aware and requesting 60mg  prozac be sent to [facility] [place]. Instructed to call back with any problems or concerns.

## 2018-04-21 ENCOUNTER — Ambulatory Visit: Payer: Medicare Other | Admitting: Physician Assistant

## 2018-04-26 ENCOUNTER — Ambulatory Visit (INDEPENDENT_AMBULATORY_CARE_PROVIDER_SITE_OTHER): Payer: 59 | Admitting: Physician Assistant

## 2018-04-26 ENCOUNTER — Encounter: Payer: Self-pay | Admitting: Physician Assistant

## 2018-04-26 ENCOUNTER — Other Ambulatory Visit: Payer: Self-pay

## 2018-04-26 DIAGNOSIS — R413 Other amnesia: Secondary | ICD-10-CM

## 2018-04-26 DIAGNOSIS — F319 Bipolar disorder, unspecified: Secondary | ICD-10-CM

## 2018-04-26 DIAGNOSIS — F411 Generalized anxiety disorder: Secondary | ICD-10-CM | POA: Diagnosis not present

## 2018-04-26 DIAGNOSIS — F422 Mixed obsessional thoughts and acts: Secondary | ICD-10-CM | POA: Diagnosis not present

## 2018-04-26 MED ORDER — FLUOXETINE HCL 40 MG PO CAPS: 40.0000 mg | ORAL_CAPSULE | Freq: Every day | ORAL | 0 refills | Status: DC

## 2018-04-26 NOTE — Progress Notes (Signed)
[facility]  Patient ID: Shelley HeadingsRobin C Willeford,  MRN: [id number]  PCP: Richmond CampbellKaplan, Kristen W., PA-C  Date of Evaluation: 04/26/18 Time spent:15 minutes  Chief Complaint:  Chief Complaint    Follow-up     Virtual Visit via Telephone Note  I connected with Shelley Headingsobin C Fenstermacher on 04/27/18 at 10:30 AM EDT by telephone and verified that I am speaking with the correct person using two identifiers.   I discussed the limitations, risks, security and privacy concerns of performing an evaluation and management service by telephone and the availability of in person appointments. I also discussed with the patient that there may be a patient responsible charge related to this service. The patient expressed understanding and agreed to proceed.    HISTORY/CURRENT STATUS: HPI For routine med check.  States she's had problems with her memory for about a year.  Hasn't mentioned it before.  An example is that if she's at a seminar or something, she can't remember what was said from the beginning of the sentence to the end so that she can write it down. Also can't remember things that her husband told her yesterday, for example.  "I don't think it's all me though.  I don't think he tells me some of the things that he says he does. It doesn't bother me too much but I would like to get help if possible." Wonders if the Prozac could be causing it.  Or if it could just be age-related.  Patient denies loss of interest in usual activities and is able to enjoy things.  Denies decreased energy or motivation.  Appetite has not changed.  No extreme sadness, tearfulness, or feelings of hopelessness.  Denies any changes in concentration or making decisions. Denies suicidal or homicidal thoughts.  Patient denies increased energy with decreased need for sleep, no increased talkativeness, no racing thoughts, no impulsivity or risky behaviors, no increased spending, no increased libido, no grandiosity.  The OCD symptoms are  pretty well controlled at this point.  She does continue to have some obsessive thoughts but it does not keep her from functioning on a day-to-day basis.  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  Not having any compulsions.  Denies muscle or joint pain, stiffness, or dystonia.  Denies dizziness, syncope, seizures, numbness, tingling, tremor, tics, unsteady gait, slurred speech, confusion.   Individual Medical History/ Review of Systems: Changes? :No    Past medications for mental health diagnoses include: Lithium, Geodon, Wellbutrin Lamictal, Prozac, Klonopin, Depakote, Xanax  Allergies: Patient has no known allergies.  Current Medications:  Current Outpatient Medications:  .  aspirin EC 81 MG tablet, Take 81 mg by mouth., Disp: , Rfl:  .  Biotin 1000 MCG tablet, Take 1,000 mcg by mouth daily., Disp: , Rfl:  .  buPROPion (WELLBUTRIN XL) 300 MG 24 hr tablet, Take 1 tablet (300 mg total) by mouth daily., Disp: 90 tablet, Rfl: 1 .  calcium carbonate (CALCIUM 600) 600 MG TABS tablet, Take 600 mg by mouth 2 (two) times daily with a meal., Disp: , Rfl:  .  clonazePAM (KLONOPIN) 0.5 MG tablet, Take 1-2 tablets (0.5-1 mg total) by mouth See admin instructions. Take 0.5 mg 08:00, 1230 and 1 mg at 2000, Disp: 360 tablet, Rfl: 1 .  lamoTRIgine (LAMICTAL) 150 MG tablet, Take 2 tablets (300 mg total) by mouth at bedtime., Disp: 180 tablet, Rfl: 1 .  levothyroxine (SYNTHROID, LEVOTHROID) 100  MCG tablet, Take 100 mcg by mouth See admin instructions. Take Monday - Saturday, Disp: , Rfl:  .  levothyroxine (SYNTHROID, LEVOTHROID) 112 MCG tablet, Take 112 mcg by mouth See admin instructions. Take Monday -Saturday, Disp: , Rfl:  .  Multiple Vitamin (MULTIVITAMIN) tablet, Take 1 tablet by mouth daily., Disp: , Rfl:  .  valACYclovir (VALTREX) 500 MG tablet, Take 500 mg by mouth at bedtime. , Disp: , Rfl:  .  ziprasidone (GEODON) 40 MG capsule, Take  1 capsule (40 mg total) by mouth 2 (two) times daily with a meal. 1  A.m. and 1 at lunch., Disp: 180 capsule, Rfl: 1 .  ziprasidone (GEODON) 80 MG capsule, Take 2 capsules (160 mg total) by mouth at bedtime., Disp: 180 capsule, Rfl: 1 .  FLUoxetine (PROZAC) 40 MG capsule, Take 1 capsule (40 mg total) by mouth daily., Disp: 90 capsule, Rfl: 0 .  HYDROcodone-homatropine (HYCODAN) 5-1.5 MG/5ML syrup, Take 5 mLs by mouth 2 (two) times daily., Disp: , Rfl:  .  Thiamine HCl (VITAMIN B1 PO), Take 1 tablet by mouth daily., Disp: , Rfl:  Medication Side Effects: none  Family Medical/ Social History: Changes?  Coronavirus pandemic and shelter in place orders have kept her at home more than usual but it is not bothering her.  MENTAL HEALTH EXAM:  There were no vitals taken for this visit.There is no height or weight on file to calculate BMI.  General Appearance: phone visit.  Unable to assess  Eye Contact:  unable to assess  Speech:  Clear and Coherent  Volume:  Normal  Mood:  Euthymic  Affect:  unable to assess  Thought Process:  Goal Directed  Orientation:  Full (Time, Place, and Person)  Thought Content: Logical   Suicidal Thoughts:  No  Homicidal Thoughts:  No  Memory:  Immediate;   Fair Recent;   Fair  Judgement:  Good  Insight:  Good  Psychomotor Activity:  unable to assess  Concentration:  Concentration: Good  Recall:  Good  Fund of Knowledge: Good  Language: Good  Assets:  Desire for Improvement  ADL's:  Intact  Cognition: WNL  Prognosis:  Good    DIAGNOSES:    ICD-10-CM   1. Bipolar I disorder (HCC) F31.9   2. Generalized anxiety disorder F41.1   3. Mixed obsessional thoughts and acts F42.2   4. Memory difficulties R41.3     Receiving Psychotherapy: No    RECOMMENDATIONS:  We discussed decreasing the Klonopin to help with memory.  But when she misses a dose, her heart starts racing and she doesn't feel good at all.  She has pretty severe anxiety and it makes her  nervous just thinking about weaning the Klonopin at all.  She understands that it could be causing the memory problems and accepts those consequences. Continue Klonopin 0.5 mg M as needed, 1 in the afternoon as needed, and 2 nightly as needed. Decrease prozac to 40mg /day and we will see if the decrease helps the memory. Continue Geodon 40mg  q am, 40mg  at lunch, and continue the 160mg  qhs.  (Patient thinks she is only taking 80 mg at night but according to the chart and prescriptions I have written it is a total of 160 mg nightly.  I have asked her to bring all of her bottles of medication into the next visit.) Continue Wellbutrin XL 300 mg p.o. daily. Continue Lamictal 300 mg daily. Return in 4 to 6 weeks.  Melony Overly, PA-C   This record  has been created using [employer].  Chart creation errors have been sought, but may not always have been located and corrected. Such creation errors do not reflect on the standard of medical care.

## 2018-06-07 ENCOUNTER — Ambulatory Visit (INDEPENDENT_AMBULATORY_CARE_PROVIDER_SITE_OTHER): Payer: 59 | Admitting: Physician Assistant

## 2018-06-07 ENCOUNTER — Other Ambulatory Visit: Payer: Self-pay

## 2018-06-07 ENCOUNTER — Encounter: Payer: Self-pay | Admitting: Physician Assistant

## 2018-06-07 ENCOUNTER — Other Ambulatory Visit: Payer: Self-pay | Admitting: Physician Assistant

## 2018-06-07 DIAGNOSIS — F422 Mixed obsessional thoughts and acts: Secondary | ICD-10-CM | POA: Diagnosis not present

## 2018-06-07 DIAGNOSIS — F411 Generalized anxiety disorder: Secondary | ICD-10-CM | POA: Diagnosis not present

## 2018-06-07 DIAGNOSIS — R413 Other amnesia: Secondary | ICD-10-CM | POA: Diagnosis not present

## 2018-06-07 DIAGNOSIS — F319 Bipolar disorder, unspecified: Secondary | ICD-10-CM

## 2018-06-07 MED ORDER — B COMPLEX VITAMINS PO CAPS: 1.0000 | ORAL_CAPSULE | Freq: Every day | ORAL | 0 refills | Status: DC

## 2018-06-07 MED ORDER — ACETYLCYSTEINE 600 MG PO CAPS: 600.0000 mg | ORAL_CAPSULE | Freq: Two times a day (BID) | ORAL | 0 refills | Status: DC

## 2018-06-07 NOTE — Progress Notes (Signed)
[facility]  Patient ID: Shelley Clarke,  MRN: [id number] Headings55  PCP: Richmond CampbellKaplan, Kristen W., PA-C  Date of Evaluation: 06/07/18 Time spent:25 minutes  Chief Complaint:  Chief Complaint    Follow-up     Virtual Visit via Telephone Note  I connected with patient by a video enabled telemedicine application or telephone, with their informed consent, and verified patient privacy and that I am speaking with the correct person using two identifiers.  I am private, in my home and the patient is in her car.   I discussed the limitations, risks, security and privacy concerns of performing an evaluation and management service by telephone and the availability of in person appointments. I also discussed with the patient that there may be a patient responsible charge related to this service. The patient expressed understanding and agreed to proceed.   I discussed the assessment and treatment plan with the patient. The patient was provided an opportunity to ask questions and all were answered. The patient agreed with the plan and demonstrated an understanding of the instructions.   The patient was advised to call back or seek an in-person evaluation if the symptoms worsen or if the condition fails to improve as anticipated.  I provided 25 minutes of non-face-to-face time during this encounter.  HISTORY/CURRENT STATUS: HPI For routine med check.  Her husband Duffy RhodyStanley is also in the car on speaker phone.  Her husband reports she seems a little manic over the past month.  She has been spending more, ordering things online.  Patient states they are necessary things like for her plants or crafts.  She is also staying up till 1030 or so cross stitching.  She is getting around 7 to 8 hours of sleep and feels rested the next day.  She is not having any risky behaviors.  No decreased need for sleep.  Patient believes some of this is due to the coronavirus pandemic and not being able to go anywhere.  She  continues to have problems with her memory.  Her husband feels that it is not quite as bad as she thinks it is.  He thinks some of it is age and that she has trouble finding words sometimes.  It is not that she is forgetting the names of certain objects or more concerning things.  She is considering buying Privigen which is a supplement advertised to help with memory.  Patient denies loss of interest in usual activities and is able to enjoy things.  Denies decreased energy or motivation.  Appetite has not changed.  No extreme sadness, tearfulness, or feelings of hopelessness.  Denies any changes in concentration, making decisions or remembering things.  Denies suicidal or homicidal thoughts.  Denies dizziness, syncope, seizures, numbness, tingling, tremor, tics, unsteady gait, slurred speech, confusion. Denies muscle or joint pain, stiffness, or dystonia.  Anxiety is well controlled.  She sleeps well most of the time.  Individual Medical History/ Review of Systems: Changes? :No    Past medications for mental health diagnoses include: Lithium, Geodon, Wellbutrin Lamictal, Prozac, Klonopin, Depakote, Xanax  Allergies: Patient has no known allergies.  Current Medications:  Current Outpatient Medications:  .  aspirin EC 81 MG tablet, Take 81 mg by mouth., Disp: , Rfl:  .  Biotin 1000 MCG tablet, Take 1,000 mcg by mouth daily., Disp: , Rfl:  .  buPROPion (WELLBUTRIN XL) 300 MG 24 hr tablet, Take 1 tablet (300 mg total) by mouth daily., Disp: 90 tablet, Rfl: 1 .  calcium  carbonate (CALCIUM 600) 600 MG TABS tablet, Take 600 mg by mouth 2 (two) times daily with a meal., Disp: , Rfl:  .  clonazePAM (KLONOPIN) 0.5 MG tablet, Take 1-2 tablets (0.5-1 mg total) by mouth See admin instructions. Take 0.5 mg 08:00, 1230 and 1 mg at 2000, Disp: 360 tablet, Rfl: 1 .  FLUoxetine (PROZAC) 40 MG capsule, Take 1 capsule (40 mg total) by mouth daily., Disp: 90 capsule, Rfl: 0 .  lamoTRIgine (LAMICTAL) 150 MG tablet,  Take 2 tablets (300 mg total) by mouth at bedtime., Disp: 180 tablet, Rfl: 1 .  levothyroxine (SYNTHROID, LEVOTHROID) 100 MCG tablet, Take 100 mcg by mouth See admin instructions. Take Monday - Saturday, Disp: , Rfl:  .  levothyroxine (SYNTHROID, LEVOTHROID) 112 MCG tablet, Take 112 mcg by mouth See admin instructions. Take Monday -Saturday, Disp: , Rfl:  .  Multiple Vitamin (MULTIVITAMIN) tablet, Take 1 tablet by mouth daily., Disp: , Rfl:  .  valACYclovir (VALTREX) 500 MG tablet, Take 500 mg by mouth at bedtime. , Disp: , Rfl:  .  ziprasidone (GEODON) 40 MG capsule, Take 1 capsule (40 mg total) by mouth 2 (two) times daily with a meal. 1  A.m. and 1 at lunch., Disp: 180 capsule, Rfl: 1 .  ziprasidone (GEODON) 80 MG capsule, Take 2 capsules (160 mg total) by mouth at bedtime., Disp: 180 capsule, Rfl: 1 .  Acetylcysteine 600 MG CAPS, Take 1 capsule (600 mg total) by mouth 2 (two) times a day., Disp: 180 capsule, Rfl: 0 .  b complex vitamins capsule, Take 1 capsule by mouth daily., Disp: 180 capsule, Rfl: 0 .  HYDROcodone-homatropine (HYCODAN) 5-1.5 MG/5ML syrup, Take 5 mLs by mouth 2 (two) times daily., Disp: , Rfl:  .  Thiamine HCl (VITAMIN B1 PO), Take 1 tablet by mouth daily., Disp: , Rfl:  Medication Side Effects: none  Family Medical/ Social History: Changes?  Shelter in place due to the coronavirus pandemic.  She is glad she is able to get out more now.  MENTAL HEALTH EXAM:  There were no vitals taken for this visit.There is no height or weight on file to calculate BMI.  General Appearance: unable to assess  Eye Contact:  unable to assess  Speech:  Clear and Coherent  Volume:  Normal  Mood:  Euthymic  Affect:  unable to assess  Thought Process:  Goal Directed  Orientation:  Full (Time, Place, and Person)  Thought Content: Logical   Suicidal Thoughts:  No  Homicidal Thoughts:  No  Memory:  Immediate;   Good Recent;   Fair Remote;   Good  Judgement:  Good  Insight:  Good   Psychomotor Activity:  unable to assess  Concentration:  Concentration: Good  Recall:  Good  Fund of Knowledge: Good  Language: Good  Assets:  Desire for Improvement  ADL's:  Intact  Cognition: WNL  Prognosis:  Good    DIAGNOSES:    ICD-10-CM   1. Bipolar I disorder (HCC) F31.9   2. Generalized anxiety disorder F41.1   3. Mixed obsessional thoughts and acts F42.2   4. Memory difficulties R41.3     Receiving Psychotherapy: No    RECOMMENDATIONS: I spent 25 minutes with her and 50% of that time was in counseling concerning the differential diagnosis and treatment options. It is difficult to tell whether she is in a true manic state or not.  Due to the circumstances with the coronavirus pandemic and changes in our society as a whole, her  behaviors may be a normal reaction to that, in which case changing medication doses would not be beneficial.  Patient and her husband understand and would like to wait on making any changes. I strongly recommend adding NAC and B complex vitamins daily.  I discussed the benefits those supplements and the fact that they help with memory, cognition and overall nervous system health would be good for her.  I recommended that she take these for at least 3 months before we can see what the benefit is.  If it does not help with memory issues then I will consider sending her for neuropsych testing. Continue Wellbutrin XL 300 mg daily. Continue Klonopin 0.5 mg 1 p.o. every morning, 1 p.o. at lunch, 2 p.o. in the evening. Continue Prozac 40 mg daily. Continue Lamictal 300 mg nightly. Continue Geodon 40 mg twice daily and 160 mg nightly. We will need labs at next visit including CMP, hemoglobin A1c, fasting lipid profile. Return in 3 months or sooner if needed.  Melony Overly, PA-C   This record has been created using [employer].  Chart creation errors have been sought, but may not always have been located and corrected. Such creation errors do not reflect  on the standard of medical care.

## 2018-06-08 ENCOUNTER — Telehealth: Payer: Self-pay | Admitting: Physician Assistant

## 2018-06-08 NOTE — Telephone Encounter (Signed)
Patient would like lab Order emailed to: [email or web address], patient has to go Friday and need Order

## 2018-06-08 NOTE — Telephone Encounter (Signed)
She states that she uses [facility] in [place]. She said she usually takes the Rx to them and they draw blood there. She is going Friday Morning at 8 for her physical and wanted to get it drawn all at once. She said she had a written Rx from you but she lost it. Is there a way you can write her new one and she will come by and pick it up? If not she said you can call them and give a verbal order.

## 2018-06-08 NOTE — Telephone Encounter (Signed)
Yes, I'll be in office tomorrow and will have order ready for her to pick up.  thanks

## 2018-06-08 NOTE — Telephone Encounter (Signed)
Had appt yesterday just wanted to make sure no changes

## 2018-06-08 NOTE — Telephone Encounter (Signed)
Please find out what lab she's going to and I'll put in order through them ([facility], [facility]?) [facility] email the order.  Thank you!

## 2018-06-09 ENCOUNTER — Telehealth: Payer: Self-pay | Admitting: Physician Assistant

## 2018-06-09 NOTE — Telephone Encounter (Signed)
Pt. Made aware and will pick it up this afternoon. She said to tell you thank you so much.

## 2018-06-09 NOTE — Telephone Encounter (Signed)
Thanks

## 2018-06-09 NOTE — Telephone Encounter (Signed)
Patient called to get lab slip for her to take to her Dr., to get labs drawn.  Also would like a paper script for labs being requested.  Please advise

## 2018-06-09 NOTE — Telephone Encounter (Signed)
Order is in box up front.

## 2018-06-10 ENCOUNTER — Telehealth: Payer: Self-pay | Admitting: Physician Assistant

## 2018-06-10 NOTE — Telephone Encounter (Signed)
Ayanni called about the lab order she picked up yesterday.  She said it was missing the annual EKG and PCTT?  Does she needs to included these?  If so please fax to her Dr. Mady Gemma at [facility] in  [place], Phone # [phone].  If she doesn't need those added then everything is ok, but she thought you always wanted these.

## 2018-06-10 NOTE — Telephone Encounter (Signed)
Please let her know I do want an EKG but not sure what she means by PCTT? When is she seeing Dr. Arlyce Dice for PE?  I'll be in office Monday and can fax order for EKG.

## 2018-06-10 NOTE — Telephone Encounter (Signed)
Patient called back and said that her appointment with dr. Arlyce Dice is on Tuesday ,June 2. She said that when she did her labs today her protime was not on the lab can you send it with the ekg to dr. Arlyce Dice

## 2018-06-10 NOTE — Telephone Encounter (Signed)
Left voicemail to call back with clarification and when her appt is with Dr. Arlyce Dice

## 2018-06-11 ENCOUNTER — Other Ambulatory Visit: Payer: Self-pay | Admitting: Physician Assistant

## 2018-06-13 NOTE — Telephone Encounter (Signed)
Pt. Made aware.

## 2018-06-13 NOTE — Telephone Encounter (Signed)
Nevermind, no labs at this time.  Ok to call whenever you can.

## 2018-06-13 NOTE — Telephone Encounter (Signed)
Please let her know that I'm faxing the order for Ekg to Dr. Arlyce Dice.  I looked back in her chart and have never ordered a PT and have no reason to w/ her psych meds. Hold off on calling her b/c I have lab results on her I haven't looked at yet. Thanks!

## 2018-09-11 ENCOUNTER — Other Ambulatory Visit: Payer: Self-pay | Admitting: Physician Assistant

## 2018-09-13 ENCOUNTER — Ambulatory Visit: Payer: Medicare Other | Admitting: Physician Assistant

## 2018-09-16 ENCOUNTER — Ambulatory Visit (INDEPENDENT_AMBULATORY_CARE_PROVIDER_SITE_OTHER): Payer: 59 | Admitting: Physician Assistant

## 2018-09-16 ENCOUNTER — Other Ambulatory Visit: Payer: Self-pay

## 2018-09-16 ENCOUNTER — Encounter: Payer: Self-pay | Admitting: Physician Assistant

## 2018-09-16 DIAGNOSIS — F422 Mixed obsessional thoughts and acts: Secondary | ICD-10-CM

## 2018-09-16 DIAGNOSIS — R413 Other amnesia: Secondary | ICD-10-CM | POA: Diagnosis not present

## 2018-09-16 DIAGNOSIS — F319 Bipolar disorder, unspecified: Secondary | ICD-10-CM

## 2018-09-16 DIAGNOSIS — Z79899 Other long term (current) drug therapy: Secondary | ICD-10-CM

## 2018-09-16 DIAGNOSIS — F411 Generalized anxiety disorder: Secondary | ICD-10-CM

## 2018-09-16 MED ORDER — BUPROPION HCL ER (XL) 300 MG PO TB24: 300.0000 mg | ORAL_TABLET | Freq: Every day | ORAL | 1 refills | Status: DC

## 2018-09-16 MED ORDER — LAMOTRIGINE 150 MG PO TABS: 300.0000 mg | ORAL_TABLET | Freq: Every day | ORAL | 1 refills | Status: DC

## 2018-09-16 MED ORDER — FLUOXETINE HCL 40 MG PO CAPS: 40.0000 mg | ORAL_CAPSULE | Freq: Every day | ORAL | 1 refills | Status: DC

## 2018-09-16 MED ORDER — ZIPRASIDONE HCL 80 MG PO CAPS: 160.0000 mg | ORAL_CAPSULE | Freq: Every day | ORAL | 1 refills | Status: DC

## 2018-09-16 MED ORDER — CLONAZEPAM 0.5 MG PO TABS: 0.5000 mg | ORAL_TABLET | ORAL | 1 refills | Status: DC

## 2018-09-16 MED ORDER — ZIPRASIDONE HCL 40 MG PO CAPS: ORAL_CAPSULE | ORAL | 1 refills | Status: DC

## 2018-09-16 NOTE — Progress Notes (Signed)
[facility]  Patient ID: Shelley Clarke,  MRN: [id number]  PCP: Aletha Halim., PA-C  Date of Evaluation: 09/16/2018 Time spent:15 minutes  Chief Complaint:  Chief Complaint    Follow-up     Virtual Visit via Telephone Note  I connected with patient by a video enabled telemedicine application or telephone, with their informed consent, and verified patient privacy and that I am speaking with the correct person using two identifiers.  I am private, in my home and the patient is home.   I discussed the limitations, risks, security and privacy concerns of performing an evaluation and management service by telephone and the availability of in person appointments. I also discussed with the patient that there may be a patient responsible charge related to this service. The patient expressed understanding and agreed to proceed.   I discussed the assessment and treatment plan with the patient. The patient was provided an opportunity to ask questions and all were answered. The patient agreed with the plan and demonstrated an understanding of the instructions.   The patient was advised to call back or seek an in-person evaluation if the symptoms worsen or if the condition fails to improve as anticipated.  I provided 15 minutes of non-face-to-face time during this encounter.  HISTORY/CURRENT STATUS: HPI For routine 3 month med check.   Doing well.  She and husband went to [place] for about a month.  Her daughter had a baby, a new grandson for them.  She's been cross-stitching a lot, late into the night, but she sleeps late in the morning so she gets her sleep out.  Her husband wanted her to tell me this.  She has also been doing gardening.    Not depressed.  Energy and motivation are good.  Not isolating, no SI/HI.  No increased energy, but stays up late, but sleeps late, is spending more but is buying things for Christmas gifts.  Not over-shopping at least she does not think  so, not going into debt. No impulsivity, no risky behavior, no AH/VH.  Anxiety is controlled. Klonopin helps.  If she does not take it she gets very jittery inside.  Denies dizziness, syncope, seizures, numbness, tingling, tremor, tics, unsteady gait, slurred speech, confusion. Denies muscle or joint pain, stiffness, or dystonia.  Individual Medical History/ Review of Systems: Changes? :No   Allergies: Patient has no known allergies.  Current Medications:  Current Outpatient Medications:  .  aspirin EC 81 MG tablet, Take 81 mg by mouth., Disp: , Rfl:  .  b complex vitamins capsule, Take 1 capsule by mouth daily., Disp: 180 capsule, Rfl: 0 .  Biotin 1000 MCG tablet, Take 1,000 mcg by mouth daily., Disp: , Rfl:  .  buPROPion (WELLBUTRIN XL) 300 MG 24 hr tablet, Take 1 tablet (300 mg total) by mouth daily., Disp: 90 tablet, Rfl: 1 .  calcium carbonate (CALCIUM 600) 600 MG TABS tablet, Take 600 mg by mouth 2 (two) times daily with a meal., Disp: , Rfl:  .  clonazePAM (KLONOPIN) 0.5 MG tablet, Take 1-2 tablets (0.5-1 mg total) by mouth See admin instructions. Take 0.5 mg 08:00, 1230 and 1 mg at 2000, Disp: 360 tablet, Rfl: 1 .  FLUoxetine (PROZAC) 40 MG capsule, Take 1 capsule (40 mg total) by mouth daily., Disp: 90 capsule, Rfl: 1 .  lamoTRIgine (LAMICTAL) 150 MG tablet, Take 2 tablets (300 mg total) by mouth at bedtime., Disp: 180 tablet, Rfl: 1 .  levothyroxine (SYNTHROID, LEVOTHROID) 100 MCG  tablet, Take 100 mcg by mouth See admin instructions. Take Monday - Saturday, Disp: , Rfl:  .  levothyroxine (SYNTHROID, LEVOTHROID) 112 MCG tablet, Take 112 mcg by mouth See admin instructions. Take Monday -Saturday, Disp: , Rfl:  .  Multiple Vitamin (MULTIVITAMIN) tablet, Take 1 tablet by mouth daily., Disp: , Rfl:  .  Thiamine HCl (VITAMIN B1 PO), Take 1 tablet by mouth daily., Disp: , Rfl:  .  valACYclovir (VALTREX) 500 MG tablet, Take 500 mg by mouth at bedtime. , Disp: , Rfl:  .  ziprasidone  (GEODON) 40 MG capsule, TAKE 1 CAPSULE EVERY MORNING AND 1 CAPSULE AT LUNCH, Disp: 180 capsule, Rfl: 1 .  ziprasidone (GEODON) 80 MG capsule, Take 2 capsules (160 mg total) by mouth at bedtime., Disp: 180 capsule, Rfl: 1 .  Acetylcysteine 600 MG CAPS, Take 1 capsule (600 mg total) by mouth 2 (two) times a day. (Patient not taking: Reported on 09/16/2018), Disp: 180 capsule, Rfl: 0 .  HYDROcodone-homatropine (HYCODAN) 5-1.5 MG/5ML syrup, Take 5 mLs by mouth 2 (two) times daily., Disp: , Rfl:  Medication Side Effects: none  Family Medical/ Social History: Changes? No  MENTAL HEALTH EXAM:  There were no vitals taken for this visit.There is no height or weight on file to calculate BMI.  General Appearance: Unable to assess  Eye Contact:  Unable to assess  Speech:  Clear and Coherent  Volume:  Normal  Mood:  Euthymic  Affect:  Unable to assess  Thought Process:  Goal Directed  Orientation:  Full (Time, Place, and Person)  Thought Content: Logical   Suicidal Thoughts:  No  Homicidal Thoughts:  No  Memory:  Recent;   Fair o/w nl  Judgement:  Good  Insight:  Good  Psychomotor Activity:  Unable to assess  Concentration:  Concentration: Good  Recall:  Good  Fund of Knowledge: Good  Language: Good  Assets:  Desire for Improvement  ADL's:  Intact  Cognition: WNL  Prognosis:  Good    DIAGNOSES:    ICD-10-CM   1. Bipolar I disorder (HCC)  F31.9   2. Generalized anxiety disorder  F41.1   3. Mixed obsessional thoughts and acts  F42.2   4. Memory difficulties  R41.3   5. Encounter for long-term (current) use of medications  Z79.899 Comprehensive metabolic panel    Lipid panel    Hemoglobin A1c    Receiving Psychotherapy: No    RECOMMENDATIONS:  PDMP was reviewed. Continue Wellbutrin XL 300 mg daily. Continue Klonopin 0.5 mg, 1 p.o. at 8 AM, 1 p.o. at lunchtime, 2 p.o. at bedtime. Continue Prozac 40 mg daily. Continue Lamictal 150 mg, 2 nightly. Continue Geodon 40 mg, 1 p.o. every  morning and 1 p.o. at lunch. Continue Geodon 80 mg, 2 p.o. nightly. Continue multivitamin, B complex, calcium, vitamin D, and fish oil. Get CMP, hemoglobin A1c, and lipid panel when she has labs done at her PCP soon.  They will also do a EKG for me. Return in 6 months.  Melony Overlyeresa Hurst, PA-C   This record has been created using [employer] software.  Chart creation errors have been sought, but may not always have been located and corrected. Such creation errors do not reflect on the standard of medical care.

## 2018-11-14 ENCOUNTER — Telehealth: Payer: Self-pay | Admitting: Physician Assistant

## 2018-11-14 NOTE — Telephone Encounter (Signed)
Pt. Made aware.

## 2018-11-14 NOTE — Telephone Encounter (Signed)
Shelley Clarke, please let her know that's fine as long as she's not having any problems.

## 2018-11-14 NOTE — Telephone Encounter (Signed)
Patient called and said that she will not be able to get her labs done and her ekg until June per her insurance. She is suppose to have a visit with you in march. Is that going to be ok? Please give her a call at [phone]

## 2018-12-19 ENCOUNTER — Other Ambulatory Visit: Payer: Self-pay | Admitting: Family Medicine

## 2018-12-19 DIAGNOSIS — Z1231 Encounter for screening mammogram for malignant neoplasm of breast: Secondary | ICD-10-CM

## 2019-02-07 ENCOUNTER — Other Ambulatory Visit: Payer: Self-pay

## 2019-02-07 ENCOUNTER — Ambulatory Visit
Admission: RE | Admit: 2019-02-07 | Discharge: 2019-02-07 | Disposition: A | Discharge status: Home / Self Care | Payer: 59 | Source: Ambulatory Visit | Attending: Family Medicine | Admitting: Family Medicine

## 2019-02-07 DIAGNOSIS — Z1231 Encounter for screening mammogram for malignant neoplasm of breast: Secondary | ICD-10-CM

## 2019-03-14 ENCOUNTER — Encounter: Payer: Self-pay | Admitting: Physician Assistant

## 2019-03-14 ENCOUNTER — Ambulatory Visit (INDEPENDENT_AMBULATORY_CARE_PROVIDER_SITE_OTHER): Payer: 59 | Admitting: Physician Assistant

## 2019-03-14 ENCOUNTER — Other Ambulatory Visit: Payer: Self-pay

## 2019-03-14 ENCOUNTER — Other Ambulatory Visit: Payer: Self-pay | Admitting: Physician Assistant

## 2019-03-14 DIAGNOSIS — F319 Bipolar disorder, unspecified: Secondary | ICD-10-CM

## 2019-03-14 DIAGNOSIS — F422 Mixed obsessional thoughts and acts: Secondary | ICD-10-CM | POA: Diagnosis not present

## 2019-03-14 DIAGNOSIS — F411 Generalized anxiety disorder: Secondary | ICD-10-CM

## 2019-03-14 MED ORDER — LAMOTRIGINE 150 MG PO TABS: 300.0000 mg | ORAL_TABLET | Freq: Every day | ORAL | 1 refills | Status: DC

## 2019-03-14 MED ORDER — BUPROPION HCL ER (XL) 300 MG PO TB24: 300.0000 mg | ORAL_TABLET | Freq: Every day | ORAL | 1 refills | Status: DC

## 2019-03-14 MED ORDER — FLUOXETINE HCL 60 MG PO TABS: 60.0000 mg | ORAL_TABLET | Freq: Every morning | ORAL | 1 refills | Status: DC

## 2019-03-14 MED ORDER — ZIPRASIDONE HCL 40 MG PO CAPS: ORAL_CAPSULE | ORAL | 1 refills | Status: DC

## 2019-03-14 MED ORDER — ZIPRASIDONE HCL 80 MG PO CAPS: 160.0000 mg | ORAL_CAPSULE | Freq: Every day | ORAL | 1 refills | Status: DC

## 2019-03-14 MED ORDER — CLONAZEPAM 0.5 MG PO TABS: 0.5000 mg | ORAL_TABLET | ORAL | 1 refills | Status: DC

## 2019-03-14 NOTE — Telephone Encounter (Signed)
Apt today at 11 

## 2019-03-14 NOTE — Progress Notes (Signed)
[facility]  Patient ID: Shelley Clarke,  MRN: [id number]  PCP: Shelley Clarke., PA-C  Date of Evaluation: 03/14/2019 Time spent:20 minutes  Chief Complaint:  Chief Complaint    Anxiety; Depression      HISTORY/CURRENT STATUS: HPIFor routine med check.  Her husband Shelley Clarke is with her.  Has been obsessing about her weight. Has lost 5# over the past couple of months.  She has several pair of pants that she was unable to fit in anymore and she is determined to get back into those pants.  They are a little bit looser now.  She states "when I would lose enough to get in those pants then I will quit working so hard at it."  Shelley Clarke states that they walk 4 miles every day.  They were walking 2 miles every day and then she wanted to increase to 3 miles every day and now it is 4 miles every day.  He is concerned that since she is still not losing that extra 5 pounds, that she will want to go up to 5 miles daily. She has been obsessing a little bit but not much, concerning her cross-stitch.  She usually stitches every night while they are watching TV.  She is in between projects and is waiting for a certain type of fabric to come in so that she can start the new project."  It is driving me crazy that I cannot get started."    States she "has some ups and downs" with her mood.  But most of the time she is stable.  She is able to enjoy things.  Energy and motivation are good.  Not crying easily.  Denies suicidal or homicidal thoughts.  No increased energy with decreased need for sleep.  No impulsivity, risky behavior, increased libido, no grandiosity, but she has been spending more.  She will max out her credit card, then pay it down, and then max it up again.  This is a cycle that has been going on for months now.  She collects snowman and birdhouses so she will often buy those.  Denies hallucinations.  Anxiety is controlled as long as she has the Klonopin.  She does take it routinely.  She  sleeps well most of the time.  Individual Medical History/ Review of Systems: Changes? :No    Past medications for mental health diagnoses include: Lithium, Geodon, Wellbutrin Lamictal, Prozac, Klonopin, Depakote, Xanax  Allergies: Patient has no known allergies.  Current Medications:  Current Outpatient Medications:  .  aspirin EC 81 MG tablet, Take 81 mg by mouth., Disp: , Rfl:  .  buPROPion (WELLBUTRIN XL) 300 MG 24 hr tablet, Take 1 tablet (300 mg total) by mouth daily., Disp: 90 tablet, Rfl: 1 .  calcium carbonate (CALCIUM 600) 600 MG TABS tablet, Take 600 mg by mouth 2 (two) times daily with a meal., Disp: , Rfl:  .  clonazePAM (KLONOPIN) 0.5 MG tablet, Take 1-2 tablets (0.5-1 mg total) by mouth See admin instructions. Take 0.5 mg 08:00, 1230 and 1 mg at 2000, Disp: 360 tablet, Rfl: 1 .  FLUoxetine (PROZAC) 40 MG capsule, Take 1 capsule (40 mg total) by mouth daily., Disp: 90 capsule, Rfl: 1 .  lamoTRIgine (LAMICTAL) 150 MG tablet, Take 2 tablets (300 mg total) by mouth at bedtime., Disp: 180 tablet, Rfl: 1 .  levothyroxine (SYNTHROID, LEVOTHROID) 100 MCG tablet, Take 100 mcg by mouth See admin instructions. Take Monday - Saturday, Disp: , Rfl:  .  levothyroxine (SYNTHROID, LEVOTHROID) 112 MCG tablet, Take 112 mcg by mouth See admin instructions. Take Monday -Saturday, Disp: , Rfl:  .  Multiple Vitamin (MULTIVITAMIN) tablet, Take 1 tablet by mouth daily., Disp: , Rfl:  .  Omega-3 Fatty Acids (OMEGA 3 500 PO), Take by mouth., Disp: , Rfl:  .  UNABLE TO FIND, Immuneti (supplement), Disp: , Rfl:  .  valACYclovir (VALTREX) 500 MG tablet, Take 500 mg by mouth at bedtime. , Disp: , Rfl:  .  ziprasidone (GEODON) 40 MG capsule, TAKE 1 CAPSULE EVERY MORNING AND 1 CAPSULE AT LUNCH, Disp: 180 capsule, Rfl: 1 .  ziprasidone (GEODON) 80 MG capsule, Take 2 capsules (160 mg total) by mouth at bedtime., Disp: 180 capsule, Rfl: 1 .  b complex vitamins capsule, Take 1 capsule by mouth daily. (Patient  not taking: Reported on 03/14/2019), Disp: 180 capsule, Rfl: 0 .  Biotin 1000 MCG tablet, Take 1,000 mcg by mouth daily., Disp: , Rfl:  .  FLUoxetine HCl 60 MG TABS, Take 60 mg by mouth in the morning., Disp: 90 tablet, Rfl: 1 .  Thiamine HCl (VITAMIN B1 PO), Take 1 tablet by mouth daily., Disp: , Rfl:  Medication Side Effects: none  Family Medical/ Social History: Changes? Yes dtr and granddtr have moved in with them, but they're moving out soon.   MENTAL HEALTH EXAM:  There were no vitals taken for this visit.There is no height or weight on file to calculate BMI.  General Appearance: Casual, Neat and Well Groomed  Eye Contact:  Good  Speech:  Clear and Coherent and Normal Rate  Volume:  Normal  Mood:  Euthymic  Affect:  Appropriate  Thought Process:  Goal Directed and Descriptions of Associations: Intact  Orientation:  Full (Time, Place, and Person)  Thought Content: Logical   Suicidal Thoughts:  No  Homicidal Thoughts:  No  Memory:  WNL  Judgement:  Good  Insight:  Good  Psychomotor Activity:  Normal  Concentration:  Concentration: Good  Recall:  Good  Fund of Knowledge: Good  Language: Good  Assets:  Desire for Improvement  ADL's:  Intact  Cognition: WNL  Prognosis:  Good    DIAGNOSES:    ICD-10-CM   1. Mixed obsessional thoughts and acts  F42.2   2. Generalized anxiety disorder  F41.1   3. Bipolar I disorder ([place])  F31.9     Receiving Psychotherapy: No    RECOMMENDATIONS:  PDMP was reviewed. I spent 20 minutes with her. We discussed increasing the Prozac.  Looking back at her records, approximately 1 year ago we decreased from 80 mg down to 60 mg, and then in April we went to 40 mg.  She was having some memory issues and we thought it might be related to the Prozac.  However things have not changed except worsening of the OCD.  I think increasing it would help that and possibly help with weight loss.  She really does not need to lose any weight as she is petite to  begin with but it is fine to lose 5 pounds.   Increase Prozac to 60 mg p.o. daily. Continue Wellbutrin XL 300 mg daily. Continue Klonopin 0.5 mg, 1 p.o. every morning, 1 p.o. at lunch, and 2 p.o. nightly. Continue Lamictal 150 mg, 2 p.o. nightly. Continue Geodon 40 mg, 1 p.o. every morning and 1 p.o. at lunch. Continue Geodon 80 mg, 2 p.o. nightly. She has an annual EKG and will have those results sent to me the  next time it is due. At next visit, I will reorder routine labs including hemoglobin A1c, lipid panel, and CMP.  Ordered previously last fall but they were not done. Return in 6 weeks.  Melony Overly, PA-C

## 2019-03-16 ENCOUNTER — Other Ambulatory Visit: Payer: Self-pay | Admitting: Physician Assistant

## 2019-03-16 MED ORDER — FLUOXETINE HCL 20 MG PO CAPS: 60.0000 mg | ORAL_CAPSULE | Freq: Every day | ORAL | 1 refills | Status: DC

## 2019-03-17 ENCOUNTER — Other Ambulatory Visit: Payer: Self-pay | Admitting: Physician Assistant

## 2019-04-11 ENCOUNTER — Other Ambulatory Visit: Payer: Self-pay | Admitting: Physician Assistant

## 2019-04-27 ENCOUNTER — Ambulatory Visit: Payer: Medicare Other | Admitting: Physician Assistant

## 2019-05-04 ENCOUNTER — Ambulatory Visit (INDEPENDENT_AMBULATORY_CARE_PROVIDER_SITE_OTHER): Payer: 59 | Admitting: Physician Assistant

## 2019-05-04 ENCOUNTER — Other Ambulatory Visit: Payer: Self-pay

## 2019-05-04 ENCOUNTER — Encounter: Payer: Self-pay | Admitting: Physician Assistant

## 2019-05-04 DIAGNOSIS — F411 Generalized anxiety disorder: Secondary | ICD-10-CM | POA: Diagnosis not present

## 2019-05-04 DIAGNOSIS — R413 Other amnesia: Secondary | ICD-10-CM

## 2019-05-04 DIAGNOSIS — F422 Mixed obsessional thoughts and acts: Secondary | ICD-10-CM

## 2019-05-04 DIAGNOSIS — F319 Bipolar disorder, unspecified: Secondary | ICD-10-CM

## 2019-05-04 NOTE — Progress Notes (Addendum)
[facility]  Patient ID: Shelley Clarke,  MRN: [id number]  PCP: Aletha Halim., PA-C  Date of Evaluation: 05/04/2019 Time spent:20 minutes  Chief Complaint:  Chief Complaint    Anxiety      HISTORY/CURRENT STATUS: HPI For routine med check.  At [place] [place], we increased the Prozac. She has been much better!  OCD hasn't been nearly as bad. She hasn't had the compulsion to buy things like she had been.  States she has low self-confidence.  Has been more afraid of a lot of things, and has only driven about 3 times since she was in the hospital in October 2019.  Feels uncomfortable out shopping or being in crowds.  Patient denies loss of interest in usual activities and is able to enjoy things.  Denies decreased energy or motivation.  Appetite has not changed.  No extreme sadness, tearfulness, or feelings of hopelessness.  Denies any changes in concentration, making decisions or remembering things.  Denies suicidal or homicidal thoughts.  Patient denies increased energy with decreased need for sleep, no increased talkativeness, no racing thoughts, no impulsivity or risky behaviors, no increased spending, no increased libido, no grandiosity, no increased irritability or anger, and no hallucinations.  Denies dizziness, syncope, seizures, numbness, tingling, tremor, tics, unsteady gait, slurred speech, confusion. Denies muscle or joint pain, stiffness, or dystonia. Denies unexplained weight loss, frequent infections, or sores that heal slowly.  No polyphagia, polydipsia, or polyuria. Denies visual changes or paresthesias.   Individual Medical History/ Review of Systems: Changes? :No    Past medications for mental health diagnoses include: Lithium, Geodon, Wellbutrin Lamictal, Prozac, Klonopin, Depakote, Xanax  Allergies: Patient has no known allergies.  Current Medications:  Current Outpatient Medications:  .  aspirin EC 81 MG tablet, Take 81 mg by mouth., Disp: , Rfl:  .   buPROPion (WELLBUTRIN XL) 300 MG 24 hr tablet, Take 1 tablet (300 mg total) by mouth daily., Disp: 90 tablet, Rfl: 1 .  calcium carbonate (CALCIUM 600) 600 MG TABS tablet, Take 600 mg by mouth 2 (two) times daily with a meal., Disp: , Rfl:  .  clonazePAM (KLONOPIN) 0.5 MG tablet, Take 1-2 tablets (0.5-1 mg total) by mouth See admin instructions. Take 0.5 mg 08:00, 1230 and 1 mg at 2000, Disp: 360 tablet, Rfl: 1 .  FLUoxetine (PROZAC) 20 MG capsule, TAKE 3 CAPSULES BY MOUTH EVERY DAY, Disp: 270 capsule, Rfl: 0 .  lamoTRIgine (LAMICTAL) 150 MG tablet, Take 2 tablets (300 mg total) by mouth at bedtime., Disp: 180 tablet, Rfl: 1 .  levothyroxine (SYNTHROID, LEVOTHROID) 100 MCG tablet, Take 100 mcg by mouth See admin instructions. Take Monday - Saturday, Disp: , Rfl:  .  levothyroxine (SYNTHROID, LEVOTHROID) 112 MCG tablet, Take 112 mcg by mouth See admin instructions. Take Monday -Saturday, Disp: , Rfl:  .  Multiple Vitamin (MULTIVITAMIN) tablet, Take 1 tablet by mouth daily., Disp: , Rfl:  .  Omega-3 Fatty Acids (OMEGA 3 500 PO), Take by mouth., Disp: , Rfl:  .  UNABLE TO FIND, Immuneti (supplement), Disp: , Rfl:  .  valACYclovir (VALTREX) 500 MG tablet, Take 500 mg by mouth at bedtime. , Disp: , Rfl:  .  ziprasidone (GEODON) 40 MG capsule, TAKE 1 CAPSULE EVERY MORNING AND 1 CAPSULE AT LUNCH, Disp: 180 capsule, Rfl: 1 .  ziprasidone (GEODON) 80 MG capsule, Take 2 capsules (160 mg total) by mouth at bedtime., Disp: 180 capsule, Rfl: 1 .  Biotin 1000 MCG tablet, Take 1,000 mcg by  mouth daily., Disp: , Rfl:  .  Thiamine HCl (VITAMIN B1 PO), Take 1 tablet by mouth daily., Disp: , Rfl:  Medication Side Effects: none  Family Medical/ Social History: Changes? No  MENTAL HEALTH EXAM:  There were no vitals taken for this visit.There is no height or weight on file to calculate BMI.  General Appearance: Casual, Neat and Well Groomed  Eye Contact:  Good  Speech:  Clear and Coherent and Normal Rate  Volume:   Normal  Mood:  Euthymic  Affect:  Appropriate  Thought Process:  Goal Directed and Descriptions of Associations: Intact  Orientation:  Full (Time, Place, and Person)  Thought Content: Logical   Suicidal Thoughts:  No  Homicidal Thoughts:  No  Memory:  WNL  Judgement:  Good  Insight:  Good  Psychomotor Activity:  Normal  Concentration:  Concentration: Good  Recall:  Good  Fund of Knowledge: Good  Language: Good  Assets:  Desire for Improvement  ADL's:  Intact  Cognition: WNL  Prognosis:  Good    DIAGNOSES:    ICD-10-CM   1. Mixed obsessional thoughts and acts  F42.2   2. Bipolar I disorder (HCC)  F31.9   3. Generalized anxiety disorder  F41.1   4. Memory difficulties  R41.3     Receiving Psychotherapy: No    RECOMMENDATIONS:  PDMP was reviewed. I spent 20 minutes with her. Continue Wellbutrin XL 300 mg 1 daily. Continue Klonopin 0.5 mg, she takes routinely, 1 p.o. at 8 AM, 1 p.o. at lunchtime, and 2 p.o. q. evening. Continue Prozac 60 mg daily. Continue Lamictal 150 mg, 2 p.o. nightly. Continue Geodon 40 mg, 1 p.o. every morning and 1 at lunch. Continue Geodon 80 mg, 2 p.o. nightly. She has a physical coming up in June and will have an EKG and labs done then. Recommend she get back into counseling. Return in 3 months.  Addendum 06/26/2019 Labs from [facility] drawn 06/20/2019 CBC was completely normal CMP completely normal specifically glucose was 96, BUN 19, creatinine 1.43, alkaline phosphatase was 44, AST 24, ALT 17. Lipid panel completely normal except for total cholesterol at 227. TSH was 10.026. There are no results for an EKG.  I will contact their office and request that.  Addendum 06/27/2019 EKG sent from [facility] dated 06/14/2018. I will discuss w/ pt at unpcoming appt.   Melony Overly, PA-C

## 2019-08-01 ENCOUNTER — Encounter: Payer: Self-pay | Admitting: Physician Assistant

## 2019-08-01 ENCOUNTER — Other Ambulatory Visit: Payer: Self-pay

## 2019-08-01 ENCOUNTER — Ambulatory Visit (INDEPENDENT_AMBULATORY_CARE_PROVIDER_SITE_OTHER): Payer: 59 | Admitting: Physician Assistant

## 2019-08-01 DIAGNOSIS — F319 Bipolar disorder, unspecified: Secondary | ICD-10-CM | POA: Diagnosis not present

## 2019-08-01 DIAGNOSIS — F411 Generalized anxiety disorder: Secondary | ICD-10-CM | POA: Diagnosis not present

## 2019-08-01 DIAGNOSIS — F422 Mixed obsessional thoughts and acts: Secondary | ICD-10-CM

## 2019-08-01 DIAGNOSIS — R251 Tremor, unspecified: Secondary | ICD-10-CM

## 2019-08-01 MED ORDER — ALPRAZOLAM 0.5 MG PO TABS: 0.5000 mg | ORAL_TABLET | ORAL | 0 refills | Status: DC | PRN

## 2019-08-01 MED ORDER — FLUOXETINE HCL 20 MG PO CAPS: 80.0000 mg | ORAL_CAPSULE | Freq: Every day | ORAL | 0 refills | Status: DC

## 2019-08-01 NOTE — Progress Notes (Signed)
[facility]  Patient ID: Shelley Clarke,  MRN: [id number]  PCP: Shelley Clarke., PA-C  Date of Evaluation: 08/01/2019 Time spent:40 minutes  Chief Complaint:  Chief Complaint    Follow-up      HISTORY/CURRENT STATUS: HPI For routine med check. Husband Shelley Clarke is with her.   "My anxiety is worse than it's ever been. I'm anxious over everything.  My self-confidence is nil. I get can anxious even getting ready for bed."  Has a hard time going to sleep. Will still get 9 hours of sleep, but it's from midnight to 9 or 9:30. Also not interested in cross-stitching any more. But has started making yard art. "it's taken over the house."   Was also obsessed about a package that was in the house, but they never found it.  They have turned the house upside down looking for that package.  She thought it was T-shirts that they had ordered, but a few weeks later the T-shirts were delivered in a different package.  They are still trying to figure out what was in the box that is missing.  She is also obsessing over a Bible that she knows is in the house somewhere.  Shelley Clarke states they have looked in every drawer in every room of the house at least 5 times and still cannot find that.  Patient states she has stopped obsessing about that.  Not having panic attacks very much, it is more of a generalized sense of anxiety.  She does not have a lot of energy or motivation and she will take a nap in the late evening, like around 8, just because she does not want to be awake.  Then she has a hard time going to sleep until midnight or so.  She feels sad but the anxiety is more of a problem than depressive symptoms.  Does not cry easily, and feels "flat" like nothing really bothers her.  She would prefer to be able to have some emotions.  No suicidal or homicidal thoughts.  Patient denies increased energy with decreased need for sleep, no increased talkativeness, no racing thoughts, no impulsivity or risky  behaviors. She has had increased spending in the past 6 weeks or so mostly spending on her new hobby. No increased libido, no grandiosity, no increased irritability or anger, no paranoia and no hallucinations.  Denies dizziness, syncope, seizures, numbness, tingling, tics, unsteady gait, slurred speech, confusion.  She does report her left leg wanting to jerk at times.  It can be anytime of the day, is not painful is mostly just annoying.  Denies muscle or joint pain, stiffness, or dystonia. Denies unexplained weight loss, frequent infections, or sores that heal slowly.  No polyphagia, polydipsia, or polyuria. Denies visual changes or paresthesias.   Individual Medical History/ Review of Systems: Changes? :No    Past medications for mental health diagnoses include: Lithium, Geodon, Wellbutrin Lamictal, Prozac, Lexapro, Klonopin, Depakote, Xanax  Allergies: Patient has no known allergies.  Current Medications:  Current Outpatient Medications:  .  aspirin EC 81 MG tablet, Take 81 mg by mouth., Disp: , Rfl:  .  buPROPion (WELLBUTRIN XL) 300 MG 24 hr tablet, Take 1 tablet (300 mg total) by mouth daily., Disp: 90 tablet, Rfl: 1 .  calcium carbonate (CALCIUM 600) 600 MG TABS tablet, Take 600 mg by mouth 2 (two) times daily with a meal., Disp: , Rfl:  .  FLUoxetine (PROZAC) 20 MG capsule, Take 4 capsules (80 mg total) by mouth daily.,  Disp: 360 capsule, Rfl: 0 .  lamoTRIgine (LAMICTAL) 150 MG tablet, Take 2 tablets (300 mg total) by mouth at bedtime., Disp: 180 tablet, Rfl: 1 .  levothyroxine (SYNTHROID, LEVOTHROID) 100 MCG tablet, Take 100 mcg by mouth See admin instructions. Take Monday - Saturday, Disp: , Rfl:  .  levothyroxine (SYNTHROID, LEVOTHROID) 112 MCG tablet, Take 112 mcg by mouth See admin instructions. Take Monday -Saturday, Disp: , Rfl:  .  Multiple Vitamin (MULTIVITAMIN) tablet, Take 1 tablet by mouth daily., Disp: , Rfl:  .  Omega-3 Fatty Acids (OMEGA 3 500 PO), Take by mouth., Disp: ,  Rfl:  .  UNABLE TO FIND, Immuneti (supplement), Disp: , Rfl:  .  valACYclovir (VALTREX) 500 MG tablet, Take 500 mg by mouth at bedtime. , Disp: , Rfl:  .  ziprasidone (GEODON) 40 MG capsule, TAKE 1 CAPSULE EVERY MORNING AND 1 CAPSULE AT LUNCH, Disp: 180 capsule, Rfl: 1 .  ziprasidone (GEODON) 80 MG capsule, Take 2 capsules (160 mg total) by mouth at bedtime., Disp: 180 capsule, Rfl: 1 .  ALPRAZolam (XANAX) 0.5 MG tablet, Take 1 tablet (0.5 mg total) by mouth every 4 (four) hours as needed for anxiety., Disp: 150 tablet, Rfl: 0 .  Biotin 1000 MCG tablet, Take 1,000 mcg by mouth daily. (Patient not taking: Reported on 08/01/2019), Disp: , Rfl:  .  Thiamine HCl (VITAMIN B1 PO), Take 1 tablet by mouth daily. (Patient not taking: Reported on 08/01/2019), Disp: , Rfl:  Medication Side Effects: none  Family Medical/ Social History: Changes? No  MENTAL HEALTH EXAM:  There were no vitals taken for this visit.There is no height or weight on file to calculate BMI.  General Appearance: Casual, Neat and Well Groomed  Eye Contact:  Good  Speech:  Clear and Coherent and Normal Rate  Volume:  Normal  Mood:  Anxious  Affect:  Anxious  Thought Process:  Goal Directed and Descriptions of Associations: Intact  Orientation:  Full (Time, Place, and Person)  Thought Content: Logical   Suicidal Thoughts:  No  Homicidal Thoughts:  No  Memory:  WNL  Judgement:  Good  Insight:  Good  Psychomotor Activity:  crosses right leg over left, left leg bounces up and down sporadically.   Concentration:  Concentration: Good and Attention Span: Good  Recall:  Good  Fund of Knowledge: Good  Language: Good  Assets:  Desire for Improvement  ADL's:  Intact  Cognition: WNL  Prognosis:  Good    DIAGNOSES:    ICD-10-CM   1. Mixed obsessional thoughts and acts  F42.2   2. Generalized anxiety disorder  F41.1   3. Bipolar I disorder (HCC)  F31.9   4. Tremor  R25.1     Receiving Psychotherapy: No    RECOMMENDATIONS:   PDMP was reviewed. I provided 40 minutes of face to face time during this encounter. We discussed different options for anxiety including increasing the Prozac for tx and prevention. Or changing to a different SSRI d/t the severity of anxiety and OCD.  We all agree to max out the Prozac before deciding to change. The klonopin isn't working well anymore so decided to change to xanax, it's fast acting, and will increase qty.   As far as the tremor of her leg goes, I feel that we should watch that for right now.  I prefer to get the anxiety and the OCD better controlled and then focus on the mild tremor. D/C Klonopin. (hold on to that bottle, in case  we need to go back to it.) I have no concerns of abuse or diversion. If stays on Xanax, will have her bring in Klonopin for [place] to dispose of it. Increase Prozac to 80 mg qd. Start Xanax 0.5 mg 1 po approx breakfast, lunch, supper, and 2 po qhs all prn.  Continue Wellbutrin XL 300 mg, 1 p.o. every morning. Continue Lamictal 150 mg, 2 p.o. nightly. Continue Geodon 40 mg, 1 p.o. every morning and 1 p.o. at lunch. Continue Geodon 80 mg, 2 p.o. nightly. Consider counseling. Return in 3 to 4 weeks.  Melony Overly, PA-C

## 2019-08-07 ENCOUNTER — Telehealth: Payer: Self-pay | Admitting: Physician Assistant

## 2019-08-07 NOTE — Telephone Encounter (Signed)
Patient is requesting to go back to clonazepam and have her husband bring up the bottle of Xanax. She would also like Ambien for sleep, she's taken that before and it's helped. Informed her her Rx's would be sent once the Xanax bottle received.    [facility]

## 2019-08-07 NOTE — Telephone Encounter (Signed)
Please triage.  I do not understand the message above about the Xanax being Xanax.  She will need to bring [place] that bottle of Xanax to dispose of..  If she needs something else for anxiety or is it sleep that is the problem.?  If it is insomnia, we could try trazodone, mirtazapine, Ambien, Lunesta, or Sonata.

## 2019-08-07 NOTE — Telephone Encounter (Signed)
Had apt on 07/20

## 2019-08-07 NOTE — Telephone Encounter (Signed)
Patient called and said that the xanax that Shelley Clarke prescribed was xanax. She said that it made her feel like a zombie and she couldn't walk straight,and didn't help her sleep. She stopped taking it but would like something else to help her. Please give her a call at [phone]

## 2019-08-07 NOTE — Telephone Encounter (Signed)
Noted  

## 2019-08-08 NOTE — Telephone Encounter (Signed)
Per previous phone message, once patient brought in bottles of Xanax, 2 new Rx's would be submitted to [facility] [place] for Ambien and Clonazepam.  Rosey Bath is out of office today but will be addressed as soon as she returns.

## 2019-08-08 NOTE — Telephone Encounter (Signed)
Received back two bottles of Xanax 0.5mg   Date filled 08/01/19 with same instructions. Pt wishes to have the new Rxs sent in now.

## 2019-08-09 ENCOUNTER — Other Ambulatory Visit: Payer: Self-pay | Admitting: Physician Assistant

## 2019-08-09 MED ORDER — CLONAZEPAM 0.5 MG PO TABS: ORAL_TABLET | ORAL | 0 refills | Status: DC

## 2019-08-09 MED ORDER — ZOLPIDEM TARTRATE 10 MG PO TABS: 10.0000 mg | ORAL_TABLET | Freq: Every evening | ORAL | 0 refills | Status: DC | PRN

## 2019-08-09 NOTE — Telephone Encounter (Signed)
Ambien and Klonopin prescriptions were sent.

## 2019-08-22 ENCOUNTER — Encounter: Payer: Self-pay | Admitting: Physician Assistant

## 2019-08-22 ENCOUNTER — Other Ambulatory Visit: Payer: Self-pay

## 2019-08-22 ENCOUNTER — Ambulatory Visit (INDEPENDENT_AMBULATORY_CARE_PROVIDER_SITE_OTHER): Payer: 59 | Admitting: Physician Assistant

## 2019-08-22 DIAGNOSIS — F429 Obsessive-compulsive disorder, unspecified: Secondary | ICD-10-CM

## 2019-08-22 DIAGNOSIS — F411 Generalized anxiety disorder: Secondary | ICD-10-CM | POA: Diagnosis not present

## 2019-08-22 DIAGNOSIS — F319 Bipolar disorder, unspecified: Secondary | ICD-10-CM

## 2019-08-22 DIAGNOSIS — F5105 Insomnia due to other mental disorder: Secondary | ICD-10-CM | POA: Diagnosis not present

## 2019-08-22 DIAGNOSIS — F99 Mental disorder, not otherwise specified: Secondary | ICD-10-CM

## 2019-08-22 MED ORDER — ESZOPICLONE 3 MG PO TABS: 3.0000 mg | ORAL_TABLET | Freq: Every evening | ORAL | 0 refills | Status: DC | PRN

## 2019-08-22 MED ORDER — ZIPRASIDONE HCL 80 MG PO CAPS: 160.0000 mg | ORAL_CAPSULE | Freq: Every day | ORAL | 1 refills | Status: DC

## 2019-08-22 MED ORDER — ZIPRASIDONE HCL 40 MG PO CAPS: ORAL_CAPSULE | ORAL | 1 refills | Status: DC

## 2019-08-22 MED ORDER — LAMOTRIGINE 150 MG PO TABS: 300.0000 mg | ORAL_TABLET | Freq: Every day | ORAL | 1 refills | Status: DC

## 2019-08-22 MED ORDER — BUPROPION HCL ER (XL) 300 MG PO TB24: 300.0000 mg | ORAL_TABLET | Freq: Every day | ORAL | 1 refills | Status: DC

## 2019-08-22 MED ORDER — FLUOXETINE HCL 40 MG PO CAPS
80.0000 mg | ORAL_CAPSULE | Freq: Every day | ORAL | 1 refills | Status: DC
Start: 2019-08-22 — End: 2019-11-20

## 2019-08-22 NOTE — Progress Notes (Signed)
[facility]  Patient ID: Shelley Clarke,  MRN: [id number]  PCP: Shelley Clarke., PA-C  Date of Evaluation: 08/22/2019 Time spent:30 minutes  Chief Complaint:  Chief Complaint    Follow-up      HISTORY/CURRENT STATUS: HPI For routine med check  Is a little less obsessed than she was at the last visit.  She is able to enjoy things.  Energy and motivation are good except for the fact that she is still not sleeping well.  That makes her tired.  Then she will take a nap sometime around 6:00 and then has a hard time going to sleep and staying asleep at night.  She has been taking Ambien 20 mg and that is the only way she can get to sleep.  She is not crying easily.  Not isolating.  She is looking forward to a couple of trips in the fall, 1 to [place] and another to [place].  And then her family will also be doing a beach trip.  Denies suicidal or homicidal thoughts.    Anxiety is kept at bay with the Klonopin at present doses.  At the last visit we tried Xanax but it made her feel wired, she could not sleep, and she just felt horrible.  We stopped that and she went straight back to the Klonopin and has been much better.  Patient denies increased energy with decreased need for sleep, no increased talkativeness, no racing thoughts, no impulsivity or risky behaviors. She has had increased spending in the past 6 weeks or so mostly spending on her new hobby. No increased libido, no grandiosity, no increased irritability or anger, no paranoia and no hallucinations.  Denies dizziness, syncope, seizures, numbness, tingling, tics, unsteady gait, slurred speech, confusion.  She does report her left leg wanting to jerk at times.  It can be anytime of the day, is not painful is mostly just annoying.  Denies muscle or joint pain, stiffness, or dystonia. Denies unexplained weight loss, frequent infections, or sores that heal slowly.  No polyphagia, polydipsia, or polyuria. Denies  visual changes or paresthesias.   Individual Medical History/ Review of Systems: Changes? :No    Past medications for mental health diagnoses include: Lithium, Geodon, Wellbutrin Lamictal, Prozac, Lexapro, Klonopin, Depakote, Xanax caused agitation, imbalance, with increased leg/arm movements, Ambien didn't work,   Allergies: Patient has no known allergies.  Current Medications:  Current Outpatient Medications:  .  aspirin EC 81 MG tablet, Take 81 mg by mouth., Disp: , Rfl:  .  buPROPion (WELLBUTRIN XL) 300 MG 24 hr tablet, Take 1 tablet (300 mg total) by mouth daily., Disp: 90 tablet, Rfl: 1 .  calcium carbonate (CALCIUM 600) 600 MG TABS tablet, Take 600 mg by mouth 2 (two) times daily with a meal., Disp: , Rfl:  .  clonazePAM (KLONOPIN) 0.5 MG tablet, 1 po at 8 am, 1 po at 12:30 pm, 2 po at 8 pm, all prn., Disp: 360 tablet, Rfl: 0 .  lamoTRIgine (LAMICTAL) 150 MG tablet, Take 2 tablets (300 mg total) by mouth at bedtime., Disp: 180 tablet, Rfl: 1 .  levothyroxine (SYNTHROID, LEVOTHROID) 100 MCG tablet, Take 100 mcg by mouth See admin instructions. Take Monday - Saturday, Disp: , Rfl:  .  levothyroxine (SYNTHROID, LEVOTHROID) 112 MCG tablet, Take 112 mcg by mouth See admin instructions. Take Monday -Saturday, Disp: , Rfl:  .  Multiple Vitamin (MULTIVITAMIN) tablet, Take 1 tablet by mouth daily., Disp: , Rfl:  .  Omega-3  Fatty Acids (OMEGA 3 500 PO), Take by mouth., Disp: , Rfl:  .  UNABLE TO FIND, Immuneti (supplement), Disp: , Rfl:  .  valACYclovir (VALTREX) 500 MG tablet, Take 500 mg by mouth at bedtime. , Disp: , Rfl:  .  ziprasidone (GEODON) 40 MG capsule, TAKE 1 CAPSULE EVERY MORNING AND 1 CAPSULE AT LUNCH, Disp: 180 capsule, Rfl: 1 .  ziprasidone (GEODON) 80 MG capsule, Take 2 capsules (160 mg total) by mouth at bedtime., Disp: 180 capsule, Rfl: 1 .  Biotin 1000 MCG tablet, Take 1,000 mcg by mouth daily. (Patient not taking: Reported on 08/01/2019), Disp: , Rfl:  .  Eszopiclone 3 MG  TABS, Take 1 tablet (3 mg total) by mouth at bedtime as needed. Take immediately before bedtime, Disp: 10 tablet, Rfl: 0 .  FLUoxetine (PROZAC) 40 MG capsule, Take 2 capsules (80 mg total) by mouth daily., Disp: 180 capsule, Rfl: 1 .  Thiamine HCl (VITAMIN B1 PO), Take 1 tablet by mouth daily. (Patient not taking: Reported on 08/01/2019), Disp: , Rfl:  Medication Side Effects: none  Family Medical/ Social History: Changes? No  MENTAL HEALTH EXAM:  There were no vitals taken for this visit.There is no height or weight on file to calculate BMI.  General Appearance: Casual, Neat and Well Groomed  Eye Contact:  Good  Speech:  Clear and Coherent and Normal Rate  Volume:  Normal  Mood:  Euthymic  Affect:  Appropriate  Thought Process:  Goal Directed and Descriptions of Associations: Intact  Orientation:  Full (Time, Place, and Person)  Thought Content: Logical   Suicidal Thoughts:  No  Homicidal Thoughts:  No  Memory:  WNL  Judgement:  Good  Insight:  Good  Psychomotor Activity:  crosses right leg over left, left leg bounces up and down sporadically.   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 Obsessive-compulsive disorder, unspecified type  F42.9   3. Generalized anxiety disorder  F41.1   4. Bipolar I disorder (HCC)  F31.9     Receiving Psychotherapy: No    RECOMMENDATIONS:  PDMP was reviewed. I provided 30 minutes of face-to-face time during this encounter. I am glad she is feeling better overall, but she needs better sleep. She is not supposed to take the Ambien more than directed on the bottle.  At this point, we will stop it. We discussed other medications, specifically Ambien CR, Lunesta, or Sonata.  I went over the  benefits, risks, side effects of each and Shelley Clarke would like to try the Lunesta first.  If that does not help, she will call back in 5 to 7 days and let me know, at that time then I will prescribe Ambien CR and if that does not work then we will try [employer].  I will give only 10 days supply with each trial. Other sleep hygiene techniques were discussed.  Be rigid about bedtime as well as the time she takes her medications.  Avoid naps during the day. Start Lunesta 3 mg, 1 p.o. nightly as needed sleep.   Cont Prozac 40 mg, 2 qd.  Continue Klonopin 0.5 mg, 1 p.o. at 8 AM, 1 p.o. at 12:30 PM, 2  p.o. at 8 PM, all as needed. Continue Wellbutrin XL 300 mg, 1 p.o. every morning. Continue Lamictal 150 mg, 2 p.o. nightly. Continue Geodon 40 mg, 1 p.o. every morning and 1 p.o. at lunch. Continue Geodon 80 mg, 2 p.o. nightly. Consider counseling. Return in 3 months, of course sooner if there are any problems.  Melony Overly, PA-C

## 2019-08-28 ENCOUNTER — Telehealth: Payer: Self-pay | Admitting: Physician Assistant

## 2019-08-28 NOTE — Telephone Encounter (Signed)
Confirmed with her and her husband, yes Lunesta (eszopiclone). She would like the Ambien CR sent to [facility].

## 2019-08-28 NOTE — Telephone Encounter (Signed)
Pt has tried [employer] and it works, however it leaves a very bitter taste in her mouth. Can she try Ambien CR instead?

## 2019-08-28 NOTE — Telephone Encounter (Signed)
Correction, she is on Lunesta now according to my records but please double check with her.  Yes we can try the Ambien CR.  Please confirm the pharmacy and I will send it in.  Thank you

## 2019-08-29 ENCOUNTER — Other Ambulatory Visit: Payer: Self-pay | Admitting: Physician Assistant

## 2019-08-29 MED ORDER — ZOLPIDEM TARTRATE ER 12.5 MG PO TBCR
12.5000 mg | EXTENDED_RELEASE_TABLET | Freq: Every evening | ORAL | 0 refills | Status: DC | PRN
Start: 2019-08-29 — End: 2019-11-20

## 2019-08-29 NOTE — Telephone Encounter (Signed)
Prescription for Ambien CR 12.5 mg, #10, was sent to [facility].  Patient, her husband, and I agreed to do a trial of 10-day supply on the sleep medications until we find 1 that works for her.

## 2019-09-06 ENCOUNTER — Telehealth: Payer: Self-pay | Admitting: Physician Assistant

## 2019-09-06 NOTE — Telephone Encounter (Signed)
Shelley Clarke called because she is having problems with her sleep medications.  She would like to discuss other options.  She doesn't want the one that you have to get up and take a 2nd dose, but otherwise would like to know of other options.  Please call.  appt 11/8

## 2019-09-07 ENCOUNTER — Other Ambulatory Visit: Payer: Self-pay | Admitting: Physician Assistant

## 2019-09-07 MED ORDER — MIRTAZAPINE 15 MG PO TABS: 7.5000 mg | ORAL_TABLET | Freq: Every evening | ORAL | 0 refills | Status: DC | PRN

## 2019-09-07 NOTE — Telephone Encounter (Signed)
She's going with the Mirtazapine. She asked if you could put a note for them to fill now so they can pick it up. I can also call and leave a message for them to fill today as well. [facility]  She said the Ambien CR just doesn't keep her asleep all night every night.

## 2019-09-07 NOTE — Telephone Encounter (Signed)
Prescription was sent

## 2019-09-07 NOTE — Telephone Encounter (Signed)
See previously phone message

## 2019-09-07 NOTE — Telephone Encounter (Signed)
I would recommend either trazodone or mirtazapine.  Adult see where she has taken either of those.  I would recommend mirtazapine first.  I will send in a prescription for 15 mg but I would like her to cut it in half and take only half for the first few nights.  If that is ineffective then she can increase to 1 pill.

## 2019-09-07 NOTE — Telephone Encounter (Signed)
Pt wants to add another phone # to reach her. [phone]

## 2019-09-08 ENCOUNTER — Telehealth: Payer: Self-pay | Admitting: Physician Assistant

## 2019-09-08 ENCOUNTER — Other Ambulatory Visit: Payer: Self-pay

## 2019-09-08 NOTE — Telephone Encounter (Signed)
Pt read label warnings on new med T. [facility]st Rxed. She does not want to gain weight and this is listed as a side effect. Also, she mentioned that the new med can affect seratonin? Wants to discuss before she takes it.

## 2019-09-11 ENCOUNTER — Telehealth: Payer: Self-pay | Admitting: Physician Assistant

## 2019-09-11 NOTE — Telephone Encounter (Signed)
Grover Canavan, please disregard this message patient has another phone message concerning this same matter.

## 2019-09-11 NOTE — Telephone Encounter (Signed)
Pt left message stating the Lunesta did not work at all. She wants to try the Ambien CR again. Asking for 10 day Rx to Pharmacy. Don't feel comfortable trying other med you suggested. [phone]

## 2019-09-11 NOTE — Telephone Encounter (Signed)
Shelley Clarke, please give her the information from [place].

## 2019-09-11 NOTE — Telephone Encounter (Signed)
Please let her know that Prozac and definitely Geodon also have the possibility of weight gain.  If she takes the mirtazapine at this low of a dose, as needed, the risk of weight gain is very low.  This is prescribed all the time with other medications that also deal with the serotonin neurotransmitter, so it is okay to take together.

## 2019-09-11 NOTE — Telephone Encounter (Signed)
See previous phone messages, asking to try Ambien CR again instead

## 2019-09-11 NOTE — Telephone Encounter (Signed)
Pt. Made aware. She stated she does not want to take the Mirtazapine because there are just to many risks with it.  Patient would also like to retry the Ambien CR. [facility] [place]. She is asking for maybe 10 pills to try if possible. Thanks

## 2019-09-12 ENCOUNTER — Telehealth: Payer: Self-pay | Admitting: Physician Assistant

## 2019-09-12 NOTE — Telephone Encounter (Signed)
See previous notes already spoken

## 2019-09-12 NOTE — Telephone Encounter (Signed)
Please let her know that mirtazapine is much safer than Ambien.  I do not understand her hesitancy to take that.  And I do not recommend going back to Ambien CR when it did not work the first time.  I recommend either the mirtazapine, Sonata which we have already talked about, or adding one of the newer drugs such as Belsomra or Dayvigo.  I know those would be much more expensive though and I am not sure what her insurance coverage will allow.

## 2019-09-12 NOTE — Telephone Encounter (Signed)
Patient took her first dose last night of Mirtazapine and slept great, she did have a bit of a hang over effect for 1-2 hours this morning. She wants to try 1/2 tablet to see if that works. Advised her if it didn't to go back to 1 tablet and give it some time to get adjusted. She agreed. Patient has been reassured about side effects.

## 2019-09-12 NOTE — Telephone Encounter (Signed)
Pt called and said she she has a few questions on a couple of her medications side effects. [phone]

## 2019-09-19 ENCOUNTER — Other Ambulatory Visit: Payer: Self-pay

## 2019-09-19 ENCOUNTER — Telehealth: Payer: Self-pay | Admitting: Physician Assistant

## 2019-09-19 ENCOUNTER — Emergency Department (HOSPITAL_COMMUNITY): Payer: 59

## 2019-09-19 DIAGNOSIS — Y929 Unspecified place or not applicable: Secondary | ICD-10-CM | POA: Diagnosis not present

## 2019-09-19 DIAGNOSIS — W010XXA Fall on same level from slipping, tripping and stumbling without subsequent striking against object, initial encounter: Secondary | ICD-10-CM | POA: Insufficient documentation

## 2019-09-19 DIAGNOSIS — E039 Hypothyroidism, unspecified: Secondary | ICD-10-CM | POA: Insufficient documentation

## 2019-09-19 DIAGNOSIS — N183 Chronic kidney disease, stage 3 unspecified: Secondary | ICD-10-CM | POA: Insufficient documentation

## 2019-09-19 DIAGNOSIS — Z79890 Hormone replacement therapy (postmenopausal): Secondary | ICD-10-CM | POA: Insufficient documentation

## 2019-09-19 DIAGNOSIS — Z7982 Long term (current) use of aspirin: Secondary | ICD-10-CM | POA: Diagnosis not present

## 2019-09-19 DIAGNOSIS — Y999 Unspecified external cause status: Secondary | ICD-10-CM | POA: Insufficient documentation

## 2019-09-19 DIAGNOSIS — S2232XA Fracture of one rib, left side, initial encounter for closed fracture: Secondary | ICD-10-CM | POA: Insufficient documentation

## 2019-09-19 DIAGNOSIS — Y9389 Activity, other specified: Secondary | ICD-10-CM | POA: Insufficient documentation

## 2019-09-19 DIAGNOSIS — S299XXA Unspecified injury of thorax, initial encounter: Secondary | ICD-10-CM | POA: Diagnosis present

## 2019-09-19 DIAGNOSIS — Z79899 Other long term (current) drug therapy: Secondary | ICD-10-CM | POA: Insufficient documentation

## 2019-09-19 NOTE — ED Triage Notes (Signed)
Patient reports to the ER for fall. Patient reports she hit her left ribs

## 2019-09-19 NOTE — Telephone Encounter (Signed)
Noted  

## 2019-09-19 NOTE — Telephone Encounter (Signed)
Pt called and left a message stating that she can't take the mirtazipine because it kept her awake all night. So she is taking the klonopin 2 at bedtime and that is working. This is just an [place]

## 2019-09-20 ENCOUNTER — Emergency Department (HOSPITAL_COMMUNITY)
Admission: EM | Admit: 2019-09-20 | Discharge: 2019-09-20 | Disposition: A | Discharge status: Home / Self Care | Payer: 59 | Attending: Emergency Medicine | Admitting: Emergency Medicine

## 2019-09-20 DIAGNOSIS — S2242XA Multiple fractures of ribs, left side, initial encounter for closed fracture: Secondary | ICD-10-CM

## 2019-09-20 MED ORDER — HYDROCODONE-ACETAMINOPHEN 5-325 MG PO TABS: 1.0000 | ORAL_TABLET | ORAL | 0 refills | Status: DC | PRN

## 2019-09-20 MED ORDER — HYDROCODONE-ACETAMINOPHEN 5-325 MG PO TABS
1.0000 | ORAL_TABLET | Freq: Once | ORAL | Status: AC
  Administered 2019-09-20: 1 via ORAL
  Filled 2019-09-20: qty 1

## 2019-09-20 NOTE — ED Provider Notes (Signed)
[facility]-EMERGENCY DEPT Provider Note   CSN: [id number] Arrival date & time: 09/19/19  1844     History Chief Complaint  Patient presents with   Marletta Lor    Shelley Clarke is a 69 y.o. female.  Patient presents to the emergency department with complaints of left sided rib injury.  Patient lost her balance earlier tonight and fell up against a wall.  Patient complaining of moderate to severe pain in the lateral mid ribs.  No significant head injury, no loss of consciousness.  No neck or back pain.  She is breathing without difficulty.        Past Medical History:  Diagnosis Date   Atypical chest pain 11/15/2017   Bipolar disorder (HCC)    Depression    Hypothyroidism     Patient Active Problem List   Diagnosis Date Noted   OCD (obsessive compulsive disorder) 12/28/2017   Generalized anxiety disorder 12/28/2017   Moderate depressed bipolar I disorder (HCC) 12/28/2017   Atypical chest pain 11/15/2017   CAP (community acquired pneumonia) 11/02/2017   Influenza A 11/02/2017   Acute respiratory failure with hypoxia (HCC) 11/02/2017   CKD (chronic kidney disease) stage 3, GFR 30-59 ml/min 11/02/2017   Bipolar disorder (HCC) 11/02/2017   Anxiety 04/15/2015   Depression 04/15/2015   Hypothyroidism 04/15/2015    Past Surgical History:  Procedure Laterality Date   BREAST LUMPECTOMY Left      OB History   No obstetric history on file.     Family History  Problem Relation Age of Onset   Breast cancer Mother 27   Hypertension Mother    Diabetes Mother    Luiz Blare' disease Sister    Heart attack Father    Brain cancer Maternal Grandmother    Kidney disease Maternal Grandfather        Bright's disease   Stroke Paternal Grandmother    Endometriosis Child    Hypertension Child     Social History   Tobacco Use   Smoking status: Never Smoker   Smokeless tobacco: Never Used  Substance Use Topics   Alcohol use: Not  Currently   Drug use: Never    Home Medications Prior to Admission medications   Medication Sig Start Date End Date Taking? Authorizing Provider  aspirin EC 81 MG tablet Take 81 mg by mouth.    [provider]  Biotin 1000 MCG tablet Take 1,000 mcg by mouth daily. Patient not taking: Reported on 08/01/2019    [provider]  buPROPion (WELLBUTRIN XL) 300 MG 24 hr tablet Take 1 tablet (300 mg total) by mouth daily. 08/22/19   Melony Overly T, PA-C  calcium carbonate (CALCIUM 600) 600 MG TABS tablet Take 600 mg by mouth 2 (two) times daily with a meal.    [provider]  clonazePAM (KLONOPIN) 0.5 MG tablet 1 po at 8 am, 1 po at 12:30 pm, 2 po at 8 pm, all prn. 08/09/19   Claybon Jabs, Glade Nurse, PA-C  FLUoxetine (PROZAC) 40 MG capsule Take 2 capsules (80 mg total) by mouth daily. 08/22/19   Cherie Ouch, PA-C  HYDROcodone-acetaminophen (NORCO/VICODIN) 5-325 MG tablet Take 1 tablet by mouth every 4 (four) hours as needed for moderate pain. 09/20/19   Gilda Crease, MD  lamoTRIgine (LAMICTAL) 150 MG tablet Take 2 tablets (300 mg total) by mouth at bedtime. 08/22/19   Melony Overly T, PA-C  levothyroxine (SYNTHROID, LEVOTHROID) 100 MCG tablet Take 100 mcg by mouth See admin  instructions. Take Monday - Saturday    [provider]  levothyroxine (SYNTHROID, LEVOTHROID) 112 MCG tablet Take 112 mcg by mouth See admin instructions. Take Monday -Saturday    [provider]  mirtazapine (REMERON) 15 MG tablet Take 0.5-1 tablets (7.5-15 mg total) by mouth at bedtime as needed. 09/07/19   Melony Overly T, PA-C  Multiple Vitamin (MULTIVITAMIN) tablet Take 1 tablet by mouth daily.    [provider]  Omega-3 Fatty Acids (OMEGA 3 500 PO) Take by mouth.    [provider]  Thiamine HCl (VITAMIN B1 PO) Take 1 tablet by mouth daily. Patient not taking: Reported on 08/01/2019    [provider]  UNABLE TO FIND Immuneti (supplement)    [provider]  valACYclovir (VALTREX) 500 MG tablet Take 500 mg by mouth at bedtime.     [provider]  ziprasidone (GEODON) 40 MG capsule TAKE 1 CAPSULE EVERY MORNING AND 1 CAPSULE AT LUNCH 08/22/19   Hurst, Teresa T, PA-C  ziprasidone (GEODON) 80 MG capsule Take 2 capsules (160 mg total) by mouth at bedtime. 08/22/19   Melony Overly T, PA-C  zolpidem (AMBIEN CR) 12.5 MG CR tablet Take 1 tablet (12.5 mg total) by mouth at bedtime as needed for sleep. 08/29/19   Cherie Ouch, PA-C    Allergies    Patient has no known allergies.  Review of Systems   Review of Systems  Musculoskeletal:       Chest wall pain  All other systems reviewed and are negative.   Physical Exam Updated Vital Signs BP 124/60 (BP Location: Left Arm)    Pulse (!) 56    Temp 98.4 F (36.9 C) (Oral)    Resp 16    Ht 5\' 4"  (1.626 m)    Wt 55.9 kg    SpO2 100%    BMI 21.15 kg/m   Physical Exam Vitals and nursing note reviewed.  Constitutional:      General: She is not in acute distress.    Appearance: Normal appearance. She is well-developed.  HENT:     Head: Normocephalic and atraumatic.     Right Ear: Hearing normal.     Left Ear: Hearing normal.     Nose: Nose normal.  Eyes:     Conjunctiva/sclera: Conjunctivae normal.     Pupils: Pupils are equal, round, and reactive to light.  Cardiovascular:     Rate and Rhythm: Regular rhythm.     Heart sounds: S1 normal and S2 normal. No murmur heard.  No friction rub. No gallop.   Pulmonary:     Effort: Pulmonary effort is normal. No respiratory distress.     Breath sounds: Normal breath sounds.  Chest:     Chest wall: No tenderness.       Comments: Tenderness mid to lower ribs on the left in the midaxillary line Abdominal:     General: Bowel sounds are normal.     Palpations: Abdomen is soft.     Tenderness: There is no abdominal tenderness. There is no guarding or rebound. Negative signs include Murphy's sign and McBurney's sign.     Hernia: No  hernia is present.     Comments: No left upper quadrant tenderness  Musculoskeletal:        General: Normal range of motion.     Cervical back: Normal range of motion and neck supple.  Skin:    General: Skin is warm and dry.     Findings:  No rash.  Neurological:     Mental Status: She is alert and oriented to person, place, and time.     GCS: GCS eye subscore is 4. GCS verbal subscore is 5. GCS motor subscore is 6.     Cranial Nerves: No cranial nerve deficit.     Sensory: No sensory deficit.     Coordination: Coordination normal.  Psychiatric:        Speech: Speech normal.        Behavior: Behavior normal.        Thought Content: Thought content normal.     ED Results / Procedures / Treatments   Labs (all labs ordered are listed, but only abnormal results are displayed) Labs Reviewed - No data to display  EKG None  Radiology DG Chest 2 View  Result Date: 09/19/2019 CLINICAL DATA:  Post fall with rib pain. Fall at home earlier today with left axillary pain. EXAM: CHEST - 2 VIEW COMPARISON:  Chest radiograph 12/29/2017 FINDINGS: Displaced left lateral eighth rib fracture, nondisplaced left lateral ninth rib fracture. Trace subcutaneous emphysema in the lateral chest wall. No pneumothorax or large pleural effusion. No evidence of pulmonary contusion or focal airspace disease. Normal heart size and mediastinal contours. Scoliotic curvature in the lower thoracic and lumbar spine. IMPRESSION: Displaced left lateral eighth rib fracture, nondisplaced left lateral ninth rib fracture. No pulmonary complication. Electronically Signed   By: Narda Rutherford M.D.   On: 09/19/2019 19:59    Procedures Procedures (including critical care time)  Medications Ordered in ED Medications  HYDROcodone-acetaminophen (NORCO/VICODIN) 5-325 MG per tablet 1 tablet (has no administration in time range)    ED Course  I have reviewed the triage vital signs and the nursing notes.  Pertinent labs &  imaging results that were available during my care of the patient were reviewed by me and considered in my medical decision making (see chart for details).    MDM Rules/Calculators/A&P                          Patient with fall earlier today and isolated rib pain.  X-ray confirms eighth and ninth rib fractures.  No evidence of pulmonary contusion.  Vital signs unremarkable.  Remainder of examination is normal.  Patient given precautions for rib fractures, treat with analgesia and incentive spirometry.  She understands to return for signs of pneumonia or worsening pain.  Final Clinical Impression(s) / ED Diagnoses Final diagnoses:  Closed fracture of multiple ribs of left side, initial encounter    Rx / DC Orders ED Discharge Orders         Ordered    HYDROcodone-acetaminophen (NORCO/VICODIN) 5-325 MG tablet  Every 4 hours PRN        09/20/19 0344           Gilda Crease, MD 09/20/19 [phone]

## 2019-09-30 ENCOUNTER — Other Ambulatory Visit: Payer: Self-pay | Admitting: Physician Assistant

## 2019-10-02 NOTE — Telephone Encounter (Signed)
review 

## 2019-11-20 ENCOUNTER — Other Ambulatory Visit: Payer: Self-pay

## 2019-11-20 ENCOUNTER — Encounter: Payer: Self-pay | Admitting: Physician Assistant

## 2019-11-20 ENCOUNTER — Ambulatory Visit (INDEPENDENT_AMBULATORY_CARE_PROVIDER_SITE_OTHER): Payer: 59 | Admitting: Physician Assistant

## 2019-11-20 DIAGNOSIS — G2401 Drug induced subacute dyskinesia: Secondary | ICD-10-CM

## 2019-11-20 DIAGNOSIS — F319 Bipolar disorder, unspecified: Secondary | ICD-10-CM

## 2019-11-20 DIAGNOSIS — F5105 Insomnia due to other mental disorder: Secondary | ICD-10-CM

## 2019-11-20 DIAGNOSIS — F411 Generalized anxiety disorder: Secondary | ICD-10-CM

## 2019-11-20 DIAGNOSIS — R413 Other amnesia: Secondary | ICD-10-CM

## 2019-11-20 DIAGNOSIS — F429 Obsessive-compulsive disorder, unspecified: Secondary | ICD-10-CM | POA: Diagnosis not present

## 2019-11-20 DIAGNOSIS — F99 Mental disorder, not otherwise specified: Secondary | ICD-10-CM

## 2019-11-20 MED ORDER — INGREZZA 60 MG PO CAPS: 60.0000 mg | ORAL_CAPSULE | Freq: Every day | ORAL | 1 refills | Status: DC

## 2019-11-20 MED ORDER — FLUVOXAMINE MALEATE 100 MG PO TABS: ORAL_TABLET | ORAL | 2 refills | Status: DC

## 2019-11-20 NOTE — Patient Instructions (Signed)
Decrease Prozac to 40 mg, 1 pill a day for 1 week and then stop on November 14. Then start Luvox on November 22

## 2019-11-20 NOTE — Progress Notes (Addendum)
[facility]  Patient ID: Shelley Clarke,  MRN: [id number]  PCP: Shelley Clarke., PA-C  Date of Evaluation: 11/20/2019 Time spent:40 minutes  Chief Complaint:  Chief Complaint    Anxiety; Depression; Insomnia      HISTORY/CURRENT STATUS: HPI For routine med check, her husband Shelley Clarke is with her today.  Very stressed right now.  They have a lot going on over the next few weeks.  They will be going to the beach for the week, then having Thanksgiving at their home with lots of family coming over, then will go spend a month or so with her daughter in [place] when she has her fifth child in December.  [name] is very sensitive to stressors of this kind.  She wants everything to be perfect and obsesses about things, or compulsions get worse during those times.  Klonopin does help.  Right now she is obsessed with making flowers out of plates, as her most recent craft.  Although she has approximately 40 plates from which to choose, she reports that none of them look good enough and she wants to go to different thrift stores all over town to find more.  They were at a craft show this past weekend and sold 17 and they have another show I think the weekend after Thanksgiving.  She feels like she will not have enough materials to make more, and they will not be good enough for people to want to buy.  Even though she sold a lot this past weekend.  She loses sleep over it.  [name] is extremely scared that she has Alzheimer's.  She had an appointment set up with neurology but she canceled it.  States she does not want to know if she has it.  She forgets things, example what she went into a room for or what she was saying during a conversation.  Her husband states that is not any worse than normal and has tried to reassure her that we all forget things and it does not mean that she has dementia.  Since her last visit in August, she fell and broke 2 ribs.  She still has a lot of problems with  balance.  Her PCP sent her to physical therapy for this which is helping some.  The imbalance is worse when she is bending over, like to pick up some of the plates that they have in the floor.  No dizziness.  When she tries to stand up she just toppled over.  She is able to enjoy some things but the obsessions are debilitating really.  Energy and motivation are good.  She is not isolating.  Not crying easily.  Appetite is normal.  She does exercise and is terrified to gain weight.  She does not want any medication that might make her gain weight even if it helps her mentally.  No suicidal or homicidal thoughts.  She has been taking the Klonopin to help her sleep.  That has been effective.  We have tried several different sleep medications but they caused different side effects that were not tolerable so they were stopped.  Patient denies increased energy with decreased need for sleep, no increased talkativeness, no racing thoughts, no impulsivity or risky behaviors. She has had increased spending in the past 6 weeks or so mostly spending on her new hobby. No increased libido, no grandiosity, no increased irritability or anger, no paranoia and no hallucinations.  Denies dizziness, syncope, seizures, numbness, tingling, tics, slurred speech, confusion.  She does report her left leg wanting to jerk at times.  It can be anytime of the day, is not painful is mostly just annoying.  Denies muscle or joint pain, stiffness, or dystonia. Denies unexplained weight loss, frequent infections, or sores that heal slowly.  No polyphagia, polydipsia, or polyuria. Denies visual changes or paresthesias.   Individual Medical History/ Review of Systems: Changes? :Yes Right knee pain, is in therapy and is wearing a brace.  Also see HPI.  Past medications for mental health diagnoses include: Lithium, Geodon, Wellbutrin Lamictal, Prozac, Lexapro, Klonopin, Depakote, Zoloft, Xanax caused agitation, imbalance, with increased  leg/arm movements, Ambien didn't work,   Allergies: Patient has no known allergies.  Current Medications:  Current Outpatient Medications:  .  aspirin EC 81 MG tablet, Take 81 mg by mouth., Disp: , Rfl:  .  b complex vitamins capsule, Take 1 capsule by mouth daily., Disp: , Rfl:  .  buPROPion (WELLBUTRIN XL) 300 MG 24 hr tablet, Take 1 tablet (300 mg total) by mouth daily., Disp: 90 tablet, Rfl: 1 .  calcium carbonate (CALCIUM 600) 600 MG TABS tablet, Take 600 mg by mouth 2 (two) times daily with a meal., Disp: , Rfl:  .  clonazePAM (KLONOPIN) 0.5 MG tablet, 1 po at 8 am, 1 po at 12:30 pm, 2 po at 8 pm, all prn., Disp: 360 tablet, Rfl: 0 .  lamoTRIgine (LAMICTAL) 150 MG tablet, Take 2 tablets (300 mg total) by mouth at bedtime., Disp: 180 tablet, Rfl: 1 .  levothyroxine (SYNTHROID, LEVOTHROID) 100 MCG tablet, Take 100 mcg by mouth See admin instructions. Take Monday - Saturday, Disp: , Rfl:  .  levothyroxine (SYNTHROID, LEVOTHROID) 112 MCG tablet, Take 112 mcg by mouth See admin instructions. Take Monday -Saturday, Disp: , Rfl:  .  Multiple Vitamin (MULTIVITAMIN) tablet, Take 1 tablet by mouth daily., Disp: , Rfl:  .  Omega-3 Fatty Acids (OMEGA 3 500 PO), Take by mouth., Disp: , Rfl:  .  UNABLE TO FIND, Immuneti (supplement), Disp: , Rfl:  .  valACYclovir (VALTREX) 500 MG tablet, Take 500 mg by mouth at bedtime. , Disp: , Rfl:  .  ziprasidone (GEODON) 40 MG capsule, TAKE 1 CAPSULE EVERY MORNING AND 1 CAPSULE AT LUNCH, Disp: 180 capsule, Rfl: 1 .  ziprasidone (GEODON) 80 MG capsule, Take 2 capsules (160 mg total) by mouth at bedtime., Disp: 180 capsule, Rfl: 1 .  Biotin 1000 MCG tablet, Take 1,000 mcg by mouth daily. (Patient not taking: Reported on 08/01/2019), Disp: , Rfl:  .  diclofenac Sodium (VOLTAREN) 1 % GEL, Apply 4 g topically 4 (four) times daily., Disp: 100 g, Rfl: 0 .  fluvoxaMINE (LUVOX) 100 MG tablet, 1 po qhs beginning 12/04/2019, then increase to 2 po qhs on 12/11/2019, Disp: 60  tablet, Rfl: 2 .  HYDROcodone-acetaminophen (NORCO/VICODIN) 5-325 MG tablet, Take 1 tablet by mouth every 4 (four) hours as needed for moderate pain. (Patient not taking: Reported on 11/20/2019), Disp: 15 tablet, Rfl: 0 .  morphine (MSIR) 15 MG tablet, Take 0.5 tablets (7.5 mg total) by mouth every 4 (four) hours as needed for severe pain., Disp: 7 tablet, Rfl: 0 .  Thiamine HCl (VITAMIN B1 PO), Take 1 tablet by mouth daily. (Patient not taking: Reported on 08/01/2019), Disp: , Rfl:  .  Valbenazine Tosylate (INGREZZA) 60 MG CAPS, Take 60 mg by mouth daily., Disp: 30 capsule, Rfl: 1 Medication Side Effects: none  Family Medical/ Social History: Changes? No  MENTAL HEALTH EXAM:  There were no vitals taken for this visit.There is no height or weight on file to calculate BMI.  General Appearance: Casual, Neat and Well Groomed  Eye Contact:  Good  Speech:  Clear and Coherent and Normal Rate  Volume:  Normal  Mood:  Euthymic  Affect:  Appropriate  Thought Process:  Goal Directed and Descriptions of Associations: Intact  Orientation:  Full (Time, Place, and Person)  Thought Content: Logical   Suicidal Thoughts:  No  Homicidal Thoughts:  No  Memory:  WNL  Judgement:  Good  Insight:  Good  Psychomotor Activity:  She constantly moves her legs, she crosses right over left, right foot crosses the back of her left foot.  Also noticed left hand tremor at rest.  Occasionally moves her mouth from side to side.  Rapid finger to thumb bilaterally is abnormal.  She cannot do it quickly and really has to focus in order to do what she can.  She is right-handed.  Concentration:  Concentration: Good and Attention Span: Good  Recall:  Good  Fund of Knowledge: Good  Language: Good  Assets:  Desire for Improvement  ADL's:  Intact  Cognition: WNL  Prognosis:  Good   AIMS     Office Visit from 11/20/2019 in [facility]  AIMS Total Score 22        DIAGNOSES:    ICD-10-CM   1. 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 Obsessive-compulsive disorder, unspecified type  F42.9   3. Bipolar I disorder (HCC)  F31.9   4. Generalized anxiety disorder  F41.1   5. 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 I provided 40 minutes of face-to-face time during this encounter. We had a lengthy discussion concerning tardive dyskinesia.  I have suspected this for a while but it has seemed to be her baseline.  When I question further today both she and her husband have noticed worsening of movements as described above.  Tardive dyskinesia can also cause instability of the muscles of the trunk which can cause balance and gait disturbances.  We discussed treatment with Ingrezza or Austedo.  Benefits risk, side effects were discussed.  They would like to proceed with treatment. We discussed the OCD.  I do not believe the Prozac is helping anymore and recommend that we get her off that and try Luvox.  That will also help her sleep.  She is very concerned about weight gain.  Stanley and I told her that if she starts gaining weight, we can switch back to the Prozac.  She has taken Zoloft in the past but we do not know why it was stopped.  But it must not have worked or she would still be on it.  That could be another recommendation but it also has the possibility of weight gain.  She does not seem excited to get off the Prozac and changed to Luvox, but Nixon and I encouraged her to do so, the Prozac is not effective now and she needs more help.  We went over the risks versus benefits and she accepts. I tried to encourage her concerning the memory issues.  I believe what she is describing is normal for aging and it worsens when under a lot of stress.  Neurology consult may be beneficial, but she does not want to go right now.  We will keep that in the back of our mind though. Start Ingrezza 40 mg, 1  p.o. daily for 1 week and then increase to 60 mg p.o. daily.  Information given concerning [employer].  They should expect a phone call from our office or that pharmacy within the next week, to get payment set up.  They are aware that usually insurance benefits through another pharmacy are still extremely expensive, so this is the best way to get this medication. Decrease Prozac to 40 mg, 1 p.o. daily for 1 week and then stop.  She will be on no SSRI for 1 week. Start Luvox 100 mg 1 p.o. nightly beginning 12/04/2019.  That may need to be increased rapidly.  Will monitor. Continue Klonopin 0.5 mg, 1 p.o. at 8 AM, 1 p.o. at 12:30 PM, 2 p.o. at 8 PM, all as needed. Continue Wellbutrin XL 300 mg, 1 p.o. every morning. Continue Lamictal 150 mg, 2 p.o. nightly. Continue Geodon 40 mg, 1 p.o. every morning and 1 p.o. at lunch. Continue Geodon 80 mg, 2 p.o. nightly. Consider counseling. Return in 4 weeks.  11/28/2019 addendum [employer] is requiring an AIMS score before considering the Ingrezza.  I know this patient well and am going by memory of her abnormal movements.  Screening shows a total of 22.  Shelley Overly, PA-C

## 2019-11-23 ENCOUNTER — Emergency Department (HOSPITAL_BASED_OUTPATIENT_CLINIC_OR_DEPARTMENT_OTHER)
Admission: EM | Admit: 2019-11-23 | Discharge: 2019-11-23 | Disposition: A | Discharge status: Home / Self Care | Payer: 59 | Attending: Emergency Medicine | Admitting: Emergency Medicine

## 2019-11-23 ENCOUNTER — Emergency Department (HOSPITAL_BASED_OUTPATIENT_CLINIC_OR_DEPARTMENT_OTHER): Payer: 59

## 2019-11-23 ENCOUNTER — Other Ambulatory Visit: Payer: Self-pay

## 2019-11-23 ENCOUNTER — Encounter (HOSPITAL_BASED_OUTPATIENT_CLINIC_OR_DEPARTMENT_OTHER): Payer: Self-pay | Admitting: Emergency Medicine

## 2019-11-23 DIAGNOSIS — N1832 Chronic kidney disease, stage 3b: Secondary | ICD-10-CM | POA: Insufficient documentation

## 2019-11-23 DIAGNOSIS — Z7982 Long term (current) use of aspirin: Secondary | ICD-10-CM | POA: Insufficient documentation

## 2019-11-23 DIAGNOSIS — S2242XA Multiple fractures of ribs, left side, initial encounter for closed fracture: Secondary | ICD-10-CM | POA: Diagnosis not present

## 2019-11-23 DIAGNOSIS — E039 Hypothyroidism, unspecified: Secondary | ICD-10-CM | POA: Diagnosis not present

## 2019-11-23 DIAGNOSIS — W01198A Fall on same level from slipping, tripping and stumbling with subsequent striking against other object, initial encounter: Secondary | ICD-10-CM | POA: Diagnosis not present

## 2019-11-23 DIAGNOSIS — S299XXA Unspecified injury of thorax, initial encounter: Secondary | ICD-10-CM | POA: Diagnosis present

## 2019-11-23 DIAGNOSIS — Z79899 Other long term (current) drug therapy: Secondary | ICD-10-CM | POA: Diagnosis not present

## 2019-11-23 MED ORDER — OXYCODONE HCL 5 MG PO TABS
2.5000 mg | ORAL_TABLET | Freq: Once | ORAL | Status: AC
  Administered 2019-11-23: 2.5 mg via ORAL
  Filled 2019-11-23: qty 1

## 2019-11-23 MED ORDER — ACETAMINOPHEN 500 MG PO TABS
1000.0000 mg | ORAL_TABLET | Freq: Once | ORAL | Status: AC
  Administered 2019-11-23: 1000 mg via ORAL
  Filled 2019-11-23: qty 2

## 2019-11-23 MED ORDER — MORPHINE SULFATE 15 MG PO TABS: 7.5000 mg | ORAL_TABLET | ORAL | 0 refills | Status: DC | PRN

## 2019-11-23 MED ORDER — DICLOFENAC SODIUM 1 % EX GEL: 4.0000 g | Freq: Four times a day (QID) | CUTANEOUS | 0 refills | Status: DC

## 2019-11-23 NOTE — ED Notes (Signed)
Has had balance issues and has been seeing a physical tx  for this may have hit her head left side is sore, denies loc

## 2019-11-23 NOTE — ED Triage Notes (Signed)
Pt states she lost her balance and fell today. C/o L rib pain and generalized body pain. She hit her head but denies LOC. No blood thinners.

## 2019-11-23 NOTE — Discharge Instructions (Signed)
Take tylenol 1000mg (2 extra strength) four times a day. Use the gel as prescribed.  Use a pillow to help support your ribs when you need to take a deep breath or cough.  Return for fever, shortness of breath.  Then take the pain medicine if you feel like you need it. Narcotics do not help with the pain, they only make you care about it less.  You can become addicted to this, people may break into your house to steal it.  It will constipate you.  If you drive under the influence of this medicine you can get a DUI.

## 2019-11-23 NOTE — ED Provider Notes (Signed)
[facility] EMERGENCY DEPARTMENT Provider Note   CSN: [id number] Arrival date & time: 11/23/19  1618     History Chief Complaint  Patient presents with  . Fall    Shelley Clarke is a 69 y.o. female.  69 yo F with a chief complaints of chest pain.  This occurred after a fall.  The patient think she was bending over to pick something up and lost her balance and landed on her left side.  She had had broken ribs that side a couple months ago.  She wonders if she really broke them.  Complaining of some pain to the left hip as well.  Worse when she bears weight.  Has been able to bear weight and ambulate without issue.  Initially denies head or neck pain but later in the history states that she thinks that maybe she jarred it.  The history is provided by the patient.  Fall This is a new problem. The current episode started less than 1 hour ago. The problem occurs constantly. The problem has not changed since onset.Pertinent negatives include no chest pain, no headaches and no shortness of breath. Nothing aggravates the symptoms. Nothing relieves the symptoms. She has tried nothing for the symptoms. The treatment provided no relief.       Past Medical History:  Diagnosis Date  . Atypical chest pain 11/15/2017  . Bipolar disorder (HCC)   . Depression   . Hypothyroidism     Patient Active Problem List   Diagnosis Date Noted  . OCD (obsessive compulsive disorder) 12/28/2017  . Generalized anxiety disorder 12/28/2017  . Moderate depressed bipolar I disorder (HCC) 12/28/2017  . Atypical chest pain 11/15/2017  . CAP (community acquired pneumonia) 11/02/2017  . Influenza A 11/02/2017  . Acute respiratory failure with hypoxia (HCC) 11/02/2017  . CKD (chronic kidney disease) stage 3, GFR 30-59 ml/min (HCC) 11/02/2017  . Bipolar disorder (HCC) 11/02/2017  . Anxiety 04/15/2015  . Depression 04/15/2015  . Hypothyroidism 04/15/2015    Past Surgical History:  Procedure  Laterality Date  . BREAST LUMPECTOMY Left      OB History   No obstetric history on file.     Family History  Problem Relation Age of Onset  . Breast cancer Mother 44  . Hypertension Mother   . Diabetes Mother   . Graves' disease Sister   . Heart attack Father   . Brain cancer Maternal Grandmother   . Kidney disease Maternal Grandfather        Bright's disease  . Stroke Paternal Grandmother   . Endometriosis Child   . Hypertension Child     Social History   Tobacco Use  . Smoking status: Never Smoker  . Smokeless tobacco: Never Used  Substance Use Topics  . Alcohol use: Not Currently  . Drug use: Never    Home Medications Prior to Admission medications   Medication Sig Start Date End Date Taking? Authorizing Provider  aspirin EC 81 MG tablet Take 81 mg by mouth.    [provider]  b complex vitamins capsule Take 1 capsule by mouth daily.    [provider]  Biotin 1000 MCG tablet Take 1,000 mcg by mouth daily. Patient not taking: Reported on 08/01/2019    [provider]  buPROPion (WELLBUTRIN XL) 300 MG 24 hr tablet Take 1 tablet (300 mg total) by mouth daily. 08/22/19   Melony Overly T, PA-C  calcium carbonate (CALCIUM 600) 600 MG TABS tablet Take 600 mg  by mouth 2 (two) times daily with a meal.    [provider]  clonazePAM (KLONOPIN) 0.5 MG tablet 1 po at 8 am, 1 po at 12:30 pm, 2 po at 8 pm, all prn. 08/09/19   Claybon Jabs, Rosey Bath T, PA-C  diclofenac Sodium (VOLTAREN) 1 % GEL Apply 4 g topically 4 (four) times daily. 11/23/19   Melene Plan, DO  fluvoxaMINE (LUVOX) 100 MG tablet 1 po qhs beginning 12/04/2019, then increase to 2 po qhs on 12/11/2019 11/20/19   Melony Overly T, PA-C  HYDROcodone-acetaminophen (NORCO/VICODIN) 5-325 MG tablet Take 1 tablet by mouth every 4 (four) hours as needed for moderate pain. Patient not taking: Reported on 11/20/2019 09/20/19   Gilda Crease, MD  lamoTRIgine (LAMICTAL) 150 MG tablet Take 2 tablets  (300 mg total) by mouth at bedtime. 08/22/19   Melony Overly T, PA-C  levothyroxine (SYNTHROID, LEVOTHROID) 100 MCG tablet Take 100 mcg by mouth See admin instructions. Take Monday - Saturday    [provider]  levothyroxine (SYNTHROID, LEVOTHROID) 112 MCG tablet Take 112 mcg by mouth See admin instructions. Take Monday -Saturday    [provider]  morphine (MSIR) 15 MG tablet Take 0.5 tablets (7.5 mg total) by mouth every 4 (four) hours as needed for severe pain. 11/23/19   Melene Plan, DO  Multiple Vitamin (MULTIVITAMIN) tablet Take 1 tablet by mouth daily.    [provider]  Omega-3 Fatty Acids (OMEGA 3 500 PO) Take by mouth.    [provider]  Thiamine HCl (VITAMIN B1 PO) Take 1 tablet by mouth daily. Patient not taking: Reported on 08/01/2019    [provider]  UNABLE TO FIND Immuneti (supplement)    [provider]  valACYclovir (VALTREX) 500 MG tablet Take 500 mg by mouth at bedtime.     [provider]  Valbenazine Tosylate (INGREZZA) 60 MG CAPS Take 60 mg by mouth daily. 11/20/19   Melony Overly T, PA-C  ziprasidone (GEODON) 40 MG capsule TAKE 1 CAPSULE EVERY MORNING AND 1 CAPSULE AT LUNCH 08/22/19   Hurst, Teresa T, PA-C  ziprasidone (GEODON) 80 MG capsule Take 2 capsules (160 mg total) by mouth at bedtime. 08/22/19   Cherie Ouch, PA-C    Allergies    Patient has no known allergies.  Review of Systems   Review of Systems  Constitutional: Negative for chills and fever.  HENT: Negative for congestion and rhinorrhea.   Eyes: Negative for redness and visual disturbance.  Respiratory: Negative for shortness of breath and wheezing.   Cardiovascular: Negative for chest pain and palpitations.  Gastrointestinal: Negative for nausea and vomiting.  Genitourinary: Negative for dysuria and urgency.  Musculoskeletal: Positive for arthralgias and back pain. Negative for myalgias.  Skin: Negative for pallor and wound.    Neurological: Negative for dizziness and headaches.    Physical Exam Updated Vital Signs BP 126/76 (BP Location: Right Arm)   Pulse (!) 51   Temp 98.4 F (36.9 C) (Oral)   Resp 12   Ht 5\' 4"  (1.626 m)   Wt 54.4 kg   SpO2 99%   BMI 20.60 kg/m   Physical Exam Vitals and nursing note reviewed.  Constitutional:      General: She is not in acute distress.    Appearance: She is well-developed. She is not diaphoretic.  HENT:     Head: Normocephalic and atraumatic.  Eyes:     Pupils: Pupils are equal, round, and reactive to light.  Cardiovascular:  Rate and Rhythm: Normal rate and regular rhythm.     Heart sounds: No murmur heard.  No friction rub. No gallop.   Pulmonary:     Effort: Pulmonary effort is normal.     Breath sounds: No wheezing or rales.  Abdominal:     General: There is no distension.     Palpations: Abdomen is soft.     Tenderness: There is no abdominal tenderness.  Musculoskeletal:        General: Tenderness present.     Cervical back: Normal range of motion and neck supple.     Comments: The pain with internal and external rotation of the left lower extremity.  No pain with compression of the pelvis.  Pain along the left posterior rib margin about ribs 8 through 12.  No midline spinal tenderness.  Mild midline C-spine tenderness with rotation to the left.  No signs of head trauma.  Palpated from head to toe without any other noted areas of bony tenderness.  Skin:    General: Skin is warm and dry.  Neurological:     Mental Status: She is alert and oriented to person, place, and time.  Psychiatric:        Behavior: Behavior normal.     ED Results / Procedures / Treatments   Labs (all labs ordered are listed, but only abnormal results are displayed) Labs Reviewed - No data to display  EKG None  Radiology DG Ribs Unilateral W/Chest Left  Result Date: 11/23/2019 CLINICAL DATA:  Fall with left-sided rib pain EXAM: LEFT RIBS AND CHEST - 3+ VIEW  COMPARISON:  09/19/2019 FINDINGS: Single-view chest demonstrates no focal opacity or pleural effusion. Normal cardiomediastinal silhouette. No pneumothorax. Healing left eighth and ninth lateral rib fractures. Suspected acute left tenth rib fracture. IMPRESSION: Suspected acute left tenth rib fracture. Healing left eighth and ninth rib fractures. Electronically Signed   By: Jasmine PangKim  Fujinaga M.D.   On: 11/23/2019 17:19   DG Hip Unilat W or Wo Pelvis 2-3 Views Left  Result Date: 11/23/2019 CLINICAL DATA:  69 year old who fell and injured the LEFT hip. Generalized LEFT hip pain and diminished range of motion. Initial encounter. EXAM: DG HIP (WITH OR WITHOUT PELVIS) 2-3V LEFT COMPARISON:  None. FINDINGS: No evidence of acute fracture or dislocation. Well-preserved joint space. Well-preserved bone mineral density. Included AP pelvis demonstrates a symmetric contralateral RIGHT hip with well-preserved joint space. Sacroiliac joints and symphysis pubis anatomically aligned without diastasis and without significant degenerative changes. Mild degenerative changes involving the visualized lower lumbar spine. IMPRESSION: No acute osseous abnormality. Electronically Signed   By: Hulan Saashomas  Lawrence M.D.   On: 11/23/2019 19:14    Procedures Procedures (including critical care time)  Medications Ordered in ED Medications  acetaminophen (TYLENOL) tablet 1,000 mg (1,000 mg Oral Given 11/23/19 1752)  oxyCODONE (Oxy IR/ROXICODONE) immediate release tablet 2.5 mg (2.5 mg Oral Given 11/23/19 1752)    ED Course  I have reviewed the triage vital signs and the nursing notes.  Pertinent labs & imaging results that were available during my care of the patient were reviewed by me and considered in my medical decision making (see chart for details).    MDM Rules/Calculators/A&P                          69 yo F with a chief complaint of a fall.  Nonsyncopal by history.  Complaining mostly of left chest wall pain.  Found to  likely have a rib fracture.  Patient appears comfortable.  Is also complaining of left hip pain will obtain plain film.  Also think she may be bumped her head.  CT head and C-spine.   CT head and C-spine is negative.  Plain film of the left hip viewed by me without fracture.  We will have the patient use her incentive spirometer at home.  PCP follow-up.  8:10 PM:  I have discussed the diagnosis/risks/treatment options with the patient and believe the pt to be eligible for discharge home to follow-up with PCP. We also discussed returning to the ED immediately if new or worsening sx occur. We discussed the sx which are most concerning (e.g., sudden worsening pain, fever, inability to tolerate by mouth) that necessitate immediate return. Medications administered to the patient during their visit and any new prescriptions provided to the patient are listed below.  Medications given during this visit Medications  acetaminophen (TYLENOL) tablet 1,000 mg (1,000 mg Oral Given 11/23/19 1752)  oxyCODONE (Oxy IR/ROXICODONE) immediate release tablet 2.5 mg (2.5 mg Oral Given 11/23/19 1752)     The patient appears reasonably screen and/or stabilized for discharge and I doubt any other medical condition or other [facility] requiring further screening, evaluation, or treatment in the ED at this time prior to discharge.   Final Clinical Impression(s) / ED Diagnoses Final diagnoses:  Closed fracture of multiple ribs of left side, initial encounter    Rx / DC Orders ED Discharge Orders         Ordered    morphine (MSIR) 15 MG tablet  Every 4 hours PRN        11/23/19 1940    diclofenac Sodium (VOLTAREN) 1 % GEL  4 times daily        11/23/19 1941           Melene Plan, DO 11/23/19 2010

## 2019-11-28 ENCOUNTER — Telehealth: Payer: Self-pay

## 2019-11-28 NOTE — Telephone Encounter (Signed)
[facility] is requiring a Baseline AIMS Scale to be faxed as well for review for Prior authorization for Ingrezza 60 mg capsules.  Form printed and faxed  [phone] f. PA# [id number]

## 2019-12-01 ENCOUNTER — Telehealth: Payer: Self-pay | Admitting: Physician Assistant

## 2019-12-01 NOTE — Telephone Encounter (Signed)
Patient called and states that she is having problems with Prozac. Side effects are legs hurting, can't sleep and headaches. She is wanting to try something else. [phone]

## 2019-12-02 NOTE — Telephone Encounter (Signed)
Please review

## 2019-12-03 NOTE — Telephone Encounter (Signed)
She should be off the Prozac now (changing to Luvox) so I'm not sure what this note means. Please clarify. Thanks

## 2019-12-04 ENCOUNTER — Telehealth: Payer: Self-pay | Admitting: Physician Assistant

## 2019-12-04 NOTE — Telephone Encounter (Signed)
Noted  

## 2019-12-04 NOTE — Telephone Encounter (Signed)
It wasn't urgent.  Her complaints were leg aches, headache, and trouble sleeping as I am transitioning her from Prozac to Luvox.  I did review her message on Friday and felt that it was not urgent so I did not call her back.  That is my responsibility and I apologize.  I did not address the message until yesterday (Sunday) knowing it would not have been dealt with until today anyway.

## 2019-12-04 NOTE — Telephone Encounter (Signed)
Pt called today upset that no one had called her on Friday about her issue. I just want to know if this is considered an urgent issue b/c she believes it to be. I would like to call her to offer that we do try to return calls within 24 hours but at times, it may be longer. I believe that Gloris Manchester is probably backed up with it being a Monday and she was off half day Friday. She worked Sat and Sun too. Thanks.

## 2019-12-04 NOTE — Telephone Encounter (Signed)
Rtc to patient and she's doing okay today, slept last 2 nights but she did have bad anxiety last night. She is completely off the Prozac and will start the Luvox tonight. She is still having a headache. I discussed that her symptoms are usual for the transition of her medication. She agreed. She was just in the ER again from falling and broke another rib but not as bad as the two she broke a couple months ago. She is in rehab and doing well with that. Informed her I would pass along her message to Rosey Bath and advised her to call back if she needs anything.

## 2019-12-06 ENCOUNTER — Telehealth: Payer: Self-pay | Admitting: Physician Assistant

## 2019-12-06 NOTE — Telephone Encounter (Signed)
Toccara states she is up to Ingreeza 60 mg now. She gained weight from the medicine so she stopped it as she pretty much thought it was the [place] but her weight is back to normal now. Can she pick up samples of 40 mg of Ingreeza? Her number is [phone].

## 2019-12-06 NOTE — Telephone Encounter (Signed)
Noted  

## 2019-12-06 NOTE — Telephone Encounter (Signed)
Rtc to patient and she explained that she thought the 60 mg Ingrezza had caused her to gain weight. She went on vacation and her jeans were fitting tight but now she has decided it wasn't the medication. Asking to restart 60 mg, advised her to go ahead and restart. She said her legs are worse off of it and seems to really be helping. She does have 60 mg at home. Will call back with further concerns.

## 2019-12-06 NOTE — Telephone Encounter (Signed)
Please review

## 2019-12-06 NOTE — Telephone Encounter (Signed)
I'll send in an Rx for 40 mg.

## 2019-12-11 ENCOUNTER — Other Ambulatory Visit: Payer: Self-pay

## 2019-12-11 ENCOUNTER — Telehealth: Payer: Self-pay

## 2019-12-11 MED ORDER — FLUOXETINE HCL 40 MG PO CAPS: 80.0000 mg | ORAL_CAPSULE | Freq: Every day | ORAL | 0 refills | Status: DC

## 2019-12-11 NOTE — Telephone Encounter (Signed)
We weaned off the Prozac and changed to Luvox because the OCD was much worse and she was not responding to the Prozac.  However it seems that the Prozac was helping the depression way more than we thought.Have her just stop the Luvox and restart the Prozac at 80 mg.  Let me know if I need to send in a prescription.

## 2019-12-11 NOTE — Telephone Encounter (Signed)
Patient notified and agreed. Updated Rx sent to [facility]. Instructed to call back with further symptoms

## 2019-12-11 NOTE — Telephone Encounter (Signed)
Patient called this morning reporting she is having an increase with her depression, crying a lot. Very down. Patient tapered off Prozac onto Luvox. She was to increase Luvox to 100 mg take 2 daily today, but since she was feeling so bad on Friday she has already increased dose. She reports unsure why she tapered off of Prozac, very tearful on phone. She was currently at physical therapy when I spoke with her. She would like to be seen or have something done over the phone. She reports feeling so bad, not having suicidal plans but over the weekend she thought of suicide but would never do that she said.  Discussed with patient the option to go to [facility], she does not feel like that is necessary at this time. Patient is leaving for [facility] on Monday, her daughter is having a baby. She wants to feel better so she can help take care of things while she's there. Patient crying a lot when getting off phone, advised her to take her Klonopin, but she did not have it with her at [facility]. She will take once she gets home with her husband.   Informed patient I would pass this on to [place] with any other recommendations other then urgent care.

## 2019-12-18 ENCOUNTER — Ambulatory Visit (INDEPENDENT_AMBULATORY_CARE_PROVIDER_SITE_OTHER): Payer: 59 | Admitting: Physician Assistant

## 2019-12-18 ENCOUNTER — Telehealth: Payer: Self-pay

## 2019-12-18 ENCOUNTER — Other Ambulatory Visit: Payer: Self-pay

## 2019-12-18 ENCOUNTER — Encounter: Payer: Self-pay | Admitting: Physician Assistant

## 2019-12-18 DIAGNOSIS — F5105 Insomnia due to other mental disorder: Secondary | ICD-10-CM

## 2019-12-18 DIAGNOSIS — F99 Mental disorder, not otherwise specified: Secondary | ICD-10-CM

## 2019-12-18 DIAGNOSIS — F411 Generalized anxiety disorder: Secondary | ICD-10-CM | POA: Diagnosis not present

## 2019-12-18 DIAGNOSIS — F429 Obsessive-compulsive disorder, unspecified: Secondary | ICD-10-CM

## 2019-12-18 DIAGNOSIS — F319 Bipolar disorder, unspecified: Secondary | ICD-10-CM | POA: Diagnosis not present

## 2019-12-18 DIAGNOSIS — R413 Other amnesia: Secondary | ICD-10-CM

## 2019-12-18 DIAGNOSIS — G2401 Drug induced subacute dyskinesia: Secondary | ICD-10-CM

## 2019-12-18 NOTE — Progress Notes (Signed)
[facility]  Patient ID: Shelley Clarke,  MRN: [id number]  PCP: Richmond Campbell., PA-C  Date of Evaluation: 12/6//2021 Time spent:40 minutes  Chief Complaint:  Chief Complaint    Anxiety; Depression; Other      HISTORY/CURRENT STATUS: HPI For routine med check, her husband [name] is with her today.  She had problems transitioning from Prozac to Luvox.  She had leg pain, headaches, was unable to sleep, states she was crying a lot.  "It was awful.  Now that I am back on the Prozac 80 mg I feel a lot better.  I feel like I am almost back to normal."    She is able to enjoy things and is excited to leave tomorrow for [facility], where her daughter and family lives.  They will be helping out with their daughter and son-in-law with their children as her daughter is currently in labor now with their newest grandchild.  Stacie has taken up cross-stitch again.  She wants to do things like that when she had stopped wanting to do those things several months ago.  Energy and motivation are good.  Appetite is normal.  She is still a bit obsessed with her weight and does not want to gain at all.  She is not calorie restricting.  Not isolating.  Sleeps pretty good.  The Klonopin does help her sleep.  She does forget things at times but it is no worse than it had been.  She does not bring up the fact that she may have Alzheimer's.  She has been worried about that in the past.  She had an appointment with neurology but canceled that.  That was not discussed further today.  Denies suicidal or homicidal thoughts  Because of perceived weight gain, she went off the Ingrezza for a week or so, she is back on it now because she realized they had just been on vacation and she probably gained a little weight from that.  States she has been taking the Ingrezza faithfully for the past 4 days or so.  She is having no side effects from it.  So far it is not helping.  She still has her leg movements and  left hand that shakes involuntarily at times.  She has also noticed squinting of her eyes involuntarily.  No movements of the trunk or neck are reported.  No falls. She does not bite her tongue or buccal mucosa.  Anxiety is still present but not nearly as bad as it was.  The Klonopin continues to help.  She does not have to have things perfect all the time like she did a few months ago.  She is not obsessed with her hobby of making flowers out of plates.  Patient denies increased energy with decreased need for sleep, no increased talkativeness, no racing thoughts, no impulsivity or risky behaviors. She has had increased spending in the past 6 weeks or so mostly spending on her new hobby. No increased libido, no grandiosity, no increased irritability or anger, no paranoia and no hallucinations.  Denies dizziness, syncope, seizures, numbness, tingling, tics, slurred speech, confusion.  She does report her left leg wanting to jerk at times.  It can be anytime of the day, is not painful is mostly just annoying.  Denies muscle or joint pain, stiffness, or dystonia. Denies unexplained weight loss, frequent infections, or sores that heal slowly.  No polyphagia, polydipsia, or polyuria. Denies visual changes or paresthesias.   Individual Medical History/ Review  of Systems: Changes? :Yes Right knee pain, is in therapy and is wearing a brace.    Past medications for mental health diagnoses include: Lithium, Geodon, Wellbutrin Lamictal, Prozac, Lexapro, Klonopin, Depakote, Zoloft, Xanax caused agitation, imbalance, with increased leg/arm movements, Ambien didn't work, Luvox  Allergies: Patient has no known allergies.  Current Medications:  Current Outpatient Medications:  .  aspirin EC 81 MG tablet, Take 81 mg by mouth., Disp: , Rfl:  .  b complex vitamins capsule, Take 1 capsule by mouth daily., Disp: , Rfl:  .  buPROPion (WELLBUTRIN XL) 300 MG 24 hr tablet, Take 1 tablet (300 mg total) by mouth daily.,  Disp: 90 tablet, Rfl: 1 .  calcium carbonate (CALCIUM 600) 600 MG TABS tablet, Take 600 mg by mouth 2 (two) times daily with a meal., Disp: , Rfl:  .  clonazePAM (KLONOPIN) 0.5 MG tablet, 1 po at 8 am, 1 po at 12:30 pm, 2 po at 8 pm, all prn., Disp: 360 tablet, Rfl: 0 .  diclofenac Sodium (VOLTAREN) 1 % GEL, Apply 4 g topically 4 (four) times daily., Disp: 100 g, Rfl: 0 .  FLUoxetine (PROZAC) 40 MG capsule, Take 2 capsules (80 mg total) by mouth daily., Disp: 180 capsule, Rfl: 0 .  lamoTRIgine (LAMICTAL) 150 MG tablet, Take 2 tablets (300 mg total) by mouth at bedtime., Disp: 180 tablet, Rfl: 1 .  levothyroxine (SYNTHROID, LEVOTHROID) 100 MCG tablet, Take 100 mcg by mouth See admin instructions. Take Monday - Saturday, Disp: , Rfl:  .  levothyroxine (SYNTHROID, LEVOTHROID) 112 MCG tablet, Take 112 mcg by mouth See admin instructions. Take Monday -Saturday, Disp: , Rfl:  .  Multiple Vitamin (MULTIVITAMIN) tablet, Take 1 tablet by mouth daily., Disp: , Rfl:  .  UNABLE TO FIND, Immuneti (supplement), Disp: , Rfl:  .  valACYclovir (VALTREX) 500 MG tablet, Take 500 mg by mouth at bedtime. , Disp: , Rfl:  .  Valbenazine Tosylate (INGREZZA) 60 MG CAPS, Take 60 mg by mouth daily., Disp: 30 capsule, Rfl: 1 .  ziprasidone (GEODON) 40 MG capsule, TAKE 1 CAPSULE EVERY MORNING AND 1 CAPSULE AT LUNCH, Disp: 180 capsule, Rfl: 1 .  ziprasidone (GEODON) 80 MG capsule, Take 2 capsules (160 mg total) by mouth at bedtime., Disp: 180 capsule, Rfl: 1 .  morphine (MSIR) 15 MG tablet, Take 0.5 tablets (7.5 mg total) by mouth every 4 (four) hours as needed for severe pain. (Patient not taking: Reported on 12/18/2019), Disp: 7 tablet, Rfl: 0 .  Omega-3 Fatty Acids (OMEGA 3 500 PO), Take by mouth. (Patient not taking: Reported on 12/18/2019), Disp: , Rfl:  .  Thiamine HCl (VITAMIN B1 PO), Take 1 tablet by mouth daily. (Patient not taking: Reported on 08/01/2019), Disp: , Rfl:  Medication Side Effects: none  Family Medical/  Social History: Changes? No  MENTAL HEALTH EXAM:  There were no vitals taken for this visit.There is no height or weight on file to calculate BMI.  General Appearance: Casual, Neat and Well Groomed  Eye Contact:  Good  Speech:  Clear and Coherent and Normal Rate  Volume:  Normal  Mood:  Euthymic  Affect:  Appropriate  Thought Process:  Goal Directed and Descriptions of Associations: Intact  Orientation:  Full (Time, Place, and Person)  Thought Content: Logical   Suicidal Thoughts:  No  Homicidal Thoughts:  No  Memory:  Recent;   Fair Otherwise normal for the most part.  Judgement:  Good  Insight:  Good  Psychomotor Activity:  Constant movement of right lower extremity and foot, more, and when she crosses her right leg over the left.  Left arm and wrist tremor occur several times during the visit.  She also purses her lips some.    Concentration:  Concentration: Good and Attention Span: Good  Recall:  Good  Fund of Knowledge: Good  Language: Good  Assets:  Desire for Improvement  ADL's:  Intact  Cognition: WNL  Prognosis:  Good   AIMS   Flowsheet Row Office Visit from 12/18/2019 in [facility] Office Visit from 11/20/2019 in [facility]  AIMS Total Score 20 22      DIAGNOSES:    ICD-10-CM   1. Tardive dyskinesia  G24.01   2. Bipolar I disorder (HCC)  F31.9   3. Obsessive-compulsive disorder, unspecified type  F42.9   4. Generalized anxiety disorder  F41.1   5. Insomnia due to other mental disorder  F51.05    F99   6. Memory difficulties  R41.3     Receiving Psychotherapy: No    RECOMMENDATIONS:  PDMP was reviewed. I provided 40 minutes of face-to-face time during this encounter. She has not been on the Ingrezza long enough for [place] to know if it is beneficial or not.  Jory and her husband understand.  Patient reports she is taking it faithfully.  For now we will continue current treatment. She and her husband are leaving tomorrow  morning to go to [place] to be with her daughter and family.  If she is still in [place] when she needs the Klonopin refilled, it is okay to send it there.  [facility] and get that information to me.  They are aware that I will probably not be able to do a 90-day supply as usual because of the nature of the prescription.  If her insurance allows it then it is okay to do a 90-day supply.  That is what I have been doing all along.  There are no concerns of abuse or diversion.  She is very responsible with her medications and I feel comfortable providing that quantity. Continue Ingrezza 60 mg, 1 p.o. daily.   Discontinue Luvox, which has already been done. Continue Prozac 40 mg, 2 p.o. daily. Continue Klonopin 0.5 mg, 1 p.o. at 8 AM, 1 p.o. at 12:30 PM, 2 p.o. at 8 PM, all as needed. Continue Wellbutrin XL 300 mg, 1 p.o. every morning. Continue Lamictal 150 mg, 2 p.o. nightly. Continue Geodon 40 mg, 1 p.o. every morning and 1 p.o. at lunch. Continue Geodon 80 mg, 2 p.o. nightly. Return in 6 weeks.  Melony Overly, PA-C

## 2019-12-18 NOTE — Telephone Encounter (Signed)
Noted  

## 2019-12-18 NOTE — Telephone Encounter (Signed)
Contacted [facility] due to patient's initial prior authorization was DENIED for Ingrezza due to requirement that baseline score for Abnormal Involuntary Movement Scale score of items 1 to 7. The AIMS scale was not received.  Office notes along with the AIMS was submitted for review and received on 12/11/2019 per [employer]. Response will be faxed over next week, usually 15 day turn around time.

## 2019-12-20 NOTE — Telephone Encounter (Signed)
Prior authorization APPROVAL received for [facility] with [facility] effective 11/19/2019-06/18/2020  Faxed approval to [place] as well.

## 2019-12-20 NOTE — Telephone Encounter (Signed)
Great news.

## 2019-12-24 DIAGNOSIS — G2401 Drug induced subacute dyskinesia: Secondary | ICD-10-CM | POA: Insufficient documentation

## 2019-12-24 DIAGNOSIS — R413 Other amnesia: Secondary | ICD-10-CM | POA: Insufficient documentation

## 2019-12-27 ENCOUNTER — Telehealth: Payer: Self-pay | Admitting: Physician Assistant

## 2019-12-27 NOTE — Telephone Encounter (Signed)
Pt spouse [name] lm requesting a 3 mos supply of Clonazepam. [phone]. Next appt scheduled for 01/31/20.

## 2019-12-29 ENCOUNTER — Other Ambulatory Visit: Payer: Self-pay | Admitting: Physician Assistant

## 2019-12-29 MED ORDER — CLONAZEPAM 0.5 MG PO TABS: ORAL_TABLET | ORAL | 0 refills | Status: DC

## 2019-12-29 NOTE — Telephone Encounter (Signed)
Prescription was sent

## 2019-12-29 NOTE — Telephone Encounter (Signed)
Pt called again requesting Rx for Clonazepam 3 mos supply Rx. [facility] [address]  [place] [address]  store [id number] PHONE # [phone]. Pt will be in [place] until 12/31. Pt contact # [phone] pharmacist name Hewitt Blade

## 2019-12-29 NOTE — Telephone Encounter (Signed)
Shelley Clarke called to follow-up on her refill on Clonazepam 0.5 mg. [facility]. [address], [place], [place].  [phone].

## 2020-01-31 ENCOUNTER — Ambulatory Visit: Payer: Medicare Other | Admitting: Physician Assistant

## 2020-02-08 ENCOUNTER — Other Ambulatory Visit: Payer: Self-pay | Admitting: Family Medicine

## 2020-02-08 ENCOUNTER — Encounter: Payer: Self-pay | Admitting: Physician Assistant

## 2020-02-08 ENCOUNTER — Other Ambulatory Visit: Payer: Self-pay

## 2020-02-08 ENCOUNTER — Ambulatory Visit (INDEPENDENT_AMBULATORY_CARE_PROVIDER_SITE_OTHER): Payer: 59 | Admitting: Physician Assistant

## 2020-02-08 DIAGNOSIS — F429 Obsessive-compulsive disorder, unspecified: Secondary | ICD-10-CM

## 2020-02-08 DIAGNOSIS — F5105 Insomnia due to other mental disorder: Secondary | ICD-10-CM | POA: Diagnosis not present

## 2020-02-08 DIAGNOSIS — F411 Generalized anxiety disorder: Secondary | ICD-10-CM

## 2020-02-08 DIAGNOSIS — G2401 Drug induced subacute dyskinesia: Secondary | ICD-10-CM

## 2020-02-08 DIAGNOSIS — R413 Other amnesia: Secondary | ICD-10-CM

## 2020-02-08 DIAGNOSIS — F99 Mental disorder, not otherwise specified: Secondary | ICD-10-CM

## 2020-02-08 DIAGNOSIS — F319 Bipolar disorder, unspecified: Secondary | ICD-10-CM

## 2020-02-08 DIAGNOSIS — Z1231 Encounter for screening mammogram for malignant neoplasm of breast: Secondary | ICD-10-CM

## 2020-02-08 MED ORDER — CLONAZEPAM 0.5 MG PO TABS: ORAL_TABLET | ORAL | 1 refills | Status: DC

## 2020-02-08 NOTE — Progress Notes (Signed)
[facility]  Patient ID: Shelley Clarke,  MRN: [id number]  PCP: Richmond Campbell., PA-C  Date of Evaluation: 02/08/2020 Time spent:40 minutes  Chief Complaint:  Chief Complaint    Anxiety; Depression      HISTORY/CURRENT STATUS: HPI For routine med check, her husband [name] is with her today.   Dtr in [facility] had her baby on 12/6. Pt and husband went there on 12/19/2019 and stayed a month. On their way home, they both weren't feeling well and then dx with covid. She was sick for several weeks, cough and fatigue, but is getting better now.  Back in PT for her balance problems that were occurring prior to covid.   She stopped taking the Ingrezza because she felt like it was not working well at that time.  But so much was going on with her travel and pelvis changes, she is not sure if it would help or not.  She would like to retry it.  States her legs continuously move.  She and her husband both noticed that.  She keeps them crossed at the knee a lot because that helps prevent them from moving so much.  Patient denies loss of interest in usual activities and is able to enjoy things.  Denies decreased energy or motivation.  Appetite has not changed.  No extreme sadness.  Denies any changes in concentration, making decisions or remembering things.  Anxiety is controlled with the Klonopin.  Denies suicidal or homicidal thoughts.  Patient denies increased energy with decreased need for sleep, no increased talkativeness, no racing thoughts, no impulsivity or risky behaviors, no increased spending, no increased libido, no grandiosity, no increased irritability or anger, and no hallucinations.  Denies dizziness, syncope, seizures, numbness, tingling, tics, slurred speech, confusion.  She does report her left leg wanting to jerk at times.  It can be anytime of the day, is not painful is mostly just annoying.  Denies muscle or joint pain, stiffness, or dystonia. Denies unexplained weight loss,  frequent infections, or sores that heal slowly.  No polyphagia, polydipsia, or polyuria. Denies visual changes or paresthesias.   Individual Medical History/ Review of Systems: Changes? :Yes She had COVID in the past month.  Past medications for mental health diagnoses include: Lithium, Geodon, Wellbutrin Lamictal, Prozac, Lexapro, Klonopin, Depakote, Zoloft, Xanax caused agitation, imbalance, with increased leg/arm movements, Ambien didn't work, Luvox  Allergies: Patient has no known allergies.  Current Medications:  Current Outpatient Medications:  .  aspirin EC 81 MG tablet, Take 81 mg by mouth., Disp: , Rfl:  .  b complex vitamins capsule, Take 1 capsule by mouth daily., Disp: , Rfl:  .  buPROPion (WELLBUTRIN XL) 300 MG 24 hr tablet, Take 1 tablet (300 mg total) by mouth daily., Disp: 90 tablet, Rfl: 1 .  calcium carbonate (OS-CAL) 600 MG TABS tablet, Take 600 mg by mouth 2 (two) times daily with a meal., Disp: , Rfl:  .  diclofenac Sodium (VOLTAREN) 1 % GEL, Apply 4 g topically 4 (four) times daily., Disp: 100 g, Rfl: 0 .  FLUoxetine (PROZAC) 40 MG capsule, Take 2 capsules (80 mg total) by mouth daily., Disp: 180 capsule, Rfl: 0 .  lamoTRIgine (LAMICTAL) 150 MG tablet, Take 2 tablets (300 mg total) by mouth at bedtime., Disp: 180 tablet, Rfl: 1 .  levothyroxine (SYNTHROID, LEVOTHROID) 100 MCG tablet, Take 100 mcg by mouth See admin instructions. Take Monday - Saturday, Disp: , Rfl:  .  levothyroxine (SYNTHROID, LEVOTHROID) 112 MCG tablet,  Take 112 mcg by mouth See admin instructions. Take Monday -Saturday, Disp: , Rfl:  .  Multiple Vitamin (MULTIVITAMIN) tablet, Take 1 tablet by mouth daily., Disp: , Rfl:  .  Omega-3 Fatty Acids (OMEGA 3 500 PO), Take by mouth., Disp: , Rfl:  .  UNABLE TO FIND, Immuneti (supplement), Disp: , Rfl:  .  valACYclovir (VALTREX) 500 MG tablet, Take 500 mg by mouth at bedtime. , Disp: , Rfl:  .  ziprasidone (GEODON) 40 MG capsule, TAKE 1 CAPSULE EVERY MORNING  AND 1 CAPSULE AT LUNCH, Disp: 180 capsule, Rfl: 1 .  ziprasidone (GEODON) 80 MG capsule, Take 2 capsules (160 mg total) by mouth at bedtime., Disp: 180 capsule, Rfl: 1 .  clonazePAM (KLONOPIN) 0.5 MG tablet, 1 po at 8 am, 1 po at 12:30 pm, 2 po at 8 pm, all prn., Disp: 360 tablet, Rfl: 1 .  morphine (MSIR) 15 MG tablet, Take 0.5 tablets (7.5 mg total) by mouth every 4 (four) hours as needed for severe pain. (Patient not taking: No sig reported), Disp: 7 tablet, Rfl: 0 .  Thiamine HCl (VITAMIN B1 PO), Take 1 tablet by mouth daily. (Patient not taking: No sig reported), Disp: , Rfl:  .  Valbenazine Tosylate (INGREZZA) 60 MG CAPS, Take 60 mg by mouth daily. (Patient not taking: Reported on 02/08/2020), Disp: 30 capsule, Rfl: 1 Medication Side Effects: none  Family Medical/ Social History: Changes? No  MENTAL HEALTH EXAM:  There were no vitals taken for this visit.There is no height or weight on file to calculate BMI.  General Appearance: Casual, Neat and Well Groomed  Eye Contact:  Good  Speech:  Clear and Coherent and Normal Rate  Volume:  Normal  Mood:  Euthymic  Affect:  Appropriate  Thought Process:  Goal Directed and Descriptions of Associations: Intact  Orientation:  Full (Time, Place, and Person)  Thought Content: Logical   Suicidal Thoughts:  No  Homicidal Thoughts:  No  Memory:  Recent;   Fair Otherwise normal for the most part.  Judgement:  Good  Insight:  Good  Psychomotor Activity:  She crosses her right leg over her left, rubs her ankles against each other. Arms are crossed often, but when not, right wrist trembles. Raises eyebrows often when not appearing to purposely do.     Concentration:  Concentration: Good and Attention Span: Good  Recall:  Good  Fund of Knowledge: Good  Language: Good  Assets:  Desire for Improvement  ADL's:  Intact  Cognition: WNL  Prognosis:  Good   AIMS   Flowsheet Row Office Visit from 02/08/2020 in [facility] Office Visit  from 12/18/2019 in [facility] Office Visit from 11/20/2019 in [facility]  AIMS Total Score 21 20 22       DIAGNOSES:    ICD-10-CM   1. Tardive dyskinesia  G24.01   2. Bipolar I disorder (HCC)  F31.9   3. Obsessive-compulsive disorder, unspecified type  F42.9   4. Generalized anxiety disorder  F41.1   5. Insomnia due to other mental disorder  F51.05    F99   6. Memory difficulties  R41.3     Receiving Psychotherapy: No    RECOMMENDATIONS:  PDMP was reviewed. I provided 40 minutes of face-to-face time during this encounter, discussing the Ingrezza in getting back on it.  She had some 40 mg and I believe has already started those.  She wants to get back to the 60 mg which would be best at  least for now. Restart Ingrezza 60 mg, 1 p.o. daily.   Continue Prozac 40 mg, 2 p.o. daily. Continue Klonopin 0.5 mg, 1 p.o. at 8 AM, 1 p.o. at 12:30 PM, 2 p.o. at 8 PM, all as needed. Continue Wellbutrin XL 300 mg, 1 p.o. every morning. Continue Lamictal 150 mg, 2 p.o. nightly. Continue Geodon 40 mg, 1 p.o. every morning and 1 p.o. at lunch. Continue Geodon 80 mg, 2 p.o. nightly. Return in 4-6 weeks.  Melony Overly, PA-C

## 2020-02-12 ENCOUNTER — Other Ambulatory Visit: Payer: Self-pay | Admitting: Physician Assistant

## 2020-02-12 NOTE — Telephone Encounter (Signed)
Please triage. Not sure what neurva is. Thanks.

## 2020-02-12 NOTE — Telephone Encounter (Signed)
Pt called and left a message asking if it is ok to take neurva plus with all of her other medicitions. Please call her at [phone]

## 2020-02-13 ENCOUNTER — Other Ambulatory Visit: Payer: Self-pay

## 2020-02-13 MED ORDER — INGREZZA 60 MG PO CAPS: 60.0000 mg | ORAL_CAPSULE | Freq: Every day | ORAL | 1 refills | Status: DC

## 2020-02-13 NOTE — Telephone Encounter (Signed)
Thanks

## 2020-02-13 NOTE — Telephone Encounter (Signed)
I spoke with patient and its an over the counter medication to help with memory, she was going to order through [facility].   Also [facility] them [facility] Ingrezza. Husband asks for cost but I did not have any of the information.

## 2020-02-13 NOTE — Telephone Encounter (Signed)
Informed husband that patient was okay to order Neurva.

## 2020-03-11 ENCOUNTER — Other Ambulatory Visit: Payer: Self-pay | Admitting: Physician Assistant

## 2020-03-18 ENCOUNTER — Telehealth: Payer: Self-pay | Admitting: Physician Assistant

## 2020-03-18 NOTE — Telephone Encounter (Signed)
Pt called and said that her insurance has been terminated for all [facility] providers. She would like a recommendation of who you think she would relate with so she can call and see if they take her new insurance which is [employer]. Please give her a call at [phone]

## 2020-03-18 NOTE — Telephone Encounter (Signed)
I hate to hear that! I don't personally know any providers outside our office, but [facility] has a good reputation, Dr. Evelene Croon, and [facility] as well.

## 2020-03-23 ENCOUNTER — Other Ambulatory Visit: Payer: Self-pay | Admitting: Physician Assistant

## 2020-03-25 ENCOUNTER — Ambulatory Visit: Payer: 59

## 2020-03-28 ENCOUNTER — Other Ambulatory Visit: Payer: Self-pay

## 2020-03-28 ENCOUNTER — Ambulatory Visit (INDEPENDENT_AMBULATORY_CARE_PROVIDER_SITE_OTHER): Payer: 59 | Admitting: Physician Assistant

## 2020-03-28 ENCOUNTER — Encounter: Payer: Self-pay | Admitting: Physician Assistant

## 2020-03-28 DIAGNOSIS — F411 Generalized anxiety disorder: Secondary | ICD-10-CM | POA: Diagnosis not present

## 2020-03-28 DIAGNOSIS — F422 Mixed obsessional thoughts and acts: Secondary | ICD-10-CM

## 2020-03-28 DIAGNOSIS — F319 Bipolar disorder, unspecified: Secondary | ICD-10-CM

## 2020-03-28 DIAGNOSIS — F5105 Insomnia due to other mental disorder: Secondary | ICD-10-CM

## 2020-03-28 DIAGNOSIS — G2401 Drug induced subacute dyskinesia: Secondary | ICD-10-CM

## 2020-03-28 DIAGNOSIS — R413 Other amnesia: Secondary | ICD-10-CM

## 2020-03-28 DIAGNOSIS — F99 Mental disorder, not otherwise specified: Secondary | ICD-10-CM

## 2020-03-28 MED ORDER — ZIPRASIDONE HCL 40 MG PO CAPS: ORAL_CAPSULE | ORAL | 1 refills | Status: DC

## 2020-03-28 MED ORDER — FLUOXETINE HCL 40 MG PO CAPS: 80.0000 mg | ORAL_CAPSULE | Freq: Every day | ORAL | 1 refills | Status: DC

## 2020-03-28 NOTE — Progress Notes (Signed)
[facility]  Patient ID: Shelley Clarke,  MRN: [id number]  PCP: Shelley Clarke., PA-C  Date of Evaluation: 03/28/2020  Time spent:30 minutes  Chief Complaint:  Chief Complaint    Anxiety; Depression      HISTORY/CURRENT STATUS: HPI For routine med check, her husband Shelley Clarke is here.   Shelley Clarke is doing well.  Feels that her medications are working just fine.  She is cross-stitching quite a bit right now.  Her daughter and family came to visit over this past weekend and they enjoyed that.  Shelley Clarke is looking forward to going [place] next [place]. Denies decreased energy or motivation.  Appetite has not changed.  No extreme sadness.  Denies any changes in concentration, making decisions or remembering things.  Anxiety is controlled with the Klonopin.  Denies suicidal or homicidal thoughts.  Patient denies increased energy with decreased need for sleep, no increased talkativeness, no racing thoughts, no impulsivity or risky behaviors, no increased spending, no increased libido, no grandiosity, no increased irritability or anger, no paranoia, and no hallucinations.  Did not start back on Ingrezza, it was going to be $150 per month.  States it was not worth it to her.  She does not have any abnormal mouth movements, just movements in her extremities mostly legs. Denies dizziness, syncope, seizures, numbness, tingling, tics, slurred speech, confusion.  Denies muscle or joint pain.  Denies unexplained weight loss, frequent infections, or sores that heal slowly.  No polyphagia, polydipsia, or polyuria. Denies visual changes or paresthesias.   Individual Medical History/ Review of Systems: Changes? :Yes    Having problems with her right knee.  She has had several injections with minimal relief.  Past medications for mental health diagnoses include: Lithium, Geodon, Wellbutrin Lamictal, Prozac, Lexapro, Klonopin, Depakote, Zoloft, Xanax caused agitation,  imbalance, with increased leg/arm movements, Ambien didn't work, Luvox  Allergies: Patient has no known allergies.  Current Medications:  Current Outpatient Medications:  .  aspirin EC 81 MG tablet, Take 81 mg by mouth., Disp: , Rfl:  .  b complex vitamins capsule, Take 1 capsule by mouth daily., Disp: , Rfl:  .  buPROPion (WELLBUTRIN XL) 300 MG 24 hr tablet, TAKE 1 TABLET BY MOUTH EVERY DAY, Disp: 90 tablet, Rfl: 1 .  calcium carbonate (OS-CAL) 600 MG TABS tablet, Take 600 mg by mouth 2 (two) times daily with a meal., Disp: , Rfl:  .  clonazePAM (KLONOPIN) 0.5 MG tablet, 1 po at 8 am, 1 po at 12:30 pm, 2 po at 8 pm, all prn., Disp: 360 tablet, Rfl: 1 .  diclofenac Sodium (VOLTAREN) 1 % GEL, Apply 4 g topically 4 (four) times daily., Disp: 100 g, Rfl: 0 .  lamoTRIgine (LAMICTAL) 150 MG tablet, TAKE 2 TABLETS BY MOUTH AT BEDTIME., Disp: 180 tablet, Rfl: 1 .  levothyroxine (SYNTHROID, LEVOTHROID) 100 MCG tablet, Take 100 mcg by mouth See admin instructions. Take Monday - Saturday, Disp: , Rfl:  .  levothyroxine (SYNTHROID, LEVOTHROID) 112 MCG tablet, Take 112 mcg by mouth See admin instructions. Take Monday -Saturday, Disp: , Rfl:  .  Multiple Vitamin (MULTIVITAMIN) tablet, Take 1 tablet by mouth daily., Disp: , Rfl:  .  Omega-3 Fatty Acids (OMEGA 3 500 PO), Take by mouth., Disp: , Rfl:  .  UNABLE TO FIND, Immuneti (supplement), Disp: , Rfl:  .  valACYclovir (VALTREX) 500 MG tablet, Take 500 mg by mouth at bedtime. , Disp: , Rfl:  .  ziprasidone (GEODON) 80 MG capsule, TAKE 2 CAPSULES (160 MG TOTAL) BY MOUTH AT BEDTIME., Disp: 180 capsule, Rfl: 1 .  FLUoxetine (PROZAC) 40 MG capsule, Take 2 capsules (80 mg total) by mouth daily., Disp: 180 capsule, Rfl: 1 .  morphine (MSIR) 15 MG tablet, Take 0.5 tablets (7.5 mg total) by mouth every 4 (four) hours as needed for severe pain. (Patient not taking: No sig reported), Disp: 7 tablet, Rfl: 0 .  Thiamine HCl (VITAMIN B1 PO), Take 1 tablet by mouth  daily. (Patient not taking: No sig reported), Disp: , Rfl:  .  ziprasidone (GEODON) 40 MG capsule, TAKE 1 CAPSULE EVERY MORNING AND 1 CAPSULE AT LUNCH, Disp: 180 capsule, Rfl: 1 Medication Side Effects: none  Family Medical/ Social History: Changes? No  MENTAL HEALTH EXAM:  There were no vitals taken for this visit.There is no height or weight on file to calculate BMI.  General Appearance: Casual, Neat and Well Groomed  Eye Contact:  Good  Speech:  Clear and Coherent and Normal Rate  Volume:  Normal  Mood:  Euthymic  Affect:  Appropriate  Thought Process:  Goal Directed and Descriptions of Associations: Intact  Orientation:  Full (Time, Place, and Person)  Thought Content: Logical   Suicidal Thoughts:  No  Homicidal Thoughts:  No  Memory:  Recent;   Fair Otherwise normal for the most part.  Judgement:  Good  Insight:  Good  Psychomotor Activity:  Crosses right leg over left, flexes right foot a lot.    Concentration:  Concentration: Good and Attention Span: Good  Recall:  Good  Fund of Knowledge: Good  Language: Good  Assets:  Desire for Improvement  ADL's:  Intact  Cognition: WNL  Prognosis:  Good    DIAGNOSES:    ICD-10-CM   1. Mixed obsessional thoughts and acts  F42.2   2. Generalized anxiety disorder  F41.1   3. Bipolar I disorder (HCC)  F31.9   4. 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 Memory difficulties  R41.3     Receiving Psychotherapy: No    RECOMMENDATIONS:  PDMP was reviewed. I provided 30 minutes of face-to-face time during this encounter, including time spent before and after the visit in record review and charting. She is doing well mentally so no changes will be made. Patient does not feel that Shelley Clarke is worth the money and the dyskinesia is not that bad anyway so she will stay off of it at least for now. Continue Prozac 40 mg, 2 p.o. daily. Continue Klonopin 0.5 mg, 1 p.o. at 8 AM, 1 p.o. at  12:30 PM, 2 p.o. at 8 PM, all as needed. Continue Wellbutrin XL 300 mg, 1 p.o. every morning. Continue Lamictal 150 mg, 2 p.o. nightly. Continue Geodon 40 mg, 1 p.o. every morning and 1 p.o. at lunch. Continue Geodon 80 mg, 2 p.o. nightly. CMP, hemoglobin A1c, and fasting lipids will be due at next visit. Return in 3 months.  Melony Overly, PA-C

## 2020-04-16 ENCOUNTER — Other Ambulatory Visit: Payer: Self-pay

## 2020-04-16 ENCOUNTER — Ambulatory Visit
Admission: RE | Admit: 2020-04-16 | Discharge: 2020-04-16 | Disposition: A | Discharge status: Home / Self Care | Payer: 59 | Source: Ambulatory Visit | Attending: Family Medicine | Admitting: Family Medicine

## 2020-04-16 DIAGNOSIS — Z1231 Encounter for screening mammogram for malignant neoplasm of breast: Secondary | ICD-10-CM

## 2020-06-07 ENCOUNTER — Other Ambulatory Visit: Payer: Self-pay | Admitting: Physician Assistant

## 2020-06-07 NOTE — Telephone Encounter (Signed)
Please review

## 2020-06-11 ENCOUNTER — Other Ambulatory Visit: Payer: Self-pay | Admitting: Physician Assistant

## 2020-06-17 ENCOUNTER — Ambulatory Visit: Payer: Self-pay | Admitting: Orthopedic Surgery

## 2020-06-27 ENCOUNTER — Other Ambulatory Visit: Payer: Self-pay

## 2020-06-27 ENCOUNTER — Ambulatory Visit: Payer: Medicare Other | Admitting: Physician Assistant

## 2020-06-27 ENCOUNTER — Encounter: Payer: Self-pay | Admitting: Physician Assistant

## 2020-06-27 DIAGNOSIS — F422 Mixed obsessional thoughts and acts: Secondary | ICD-10-CM

## 2020-06-27 DIAGNOSIS — G2401 Drug induced subacute dyskinesia: Secondary | ICD-10-CM

## 2020-06-27 DIAGNOSIS — F411 Generalized anxiety disorder: Secondary | ICD-10-CM

## 2020-06-27 DIAGNOSIS — F319 Bipolar disorder, unspecified: Secondary | ICD-10-CM | POA: Diagnosis not present

## 2020-06-27 DIAGNOSIS — R413 Other amnesia: Secondary | ICD-10-CM

## 2020-06-27 MED ORDER — CLONAZEPAM 0.5 MG PO TABS: ORAL_TABLET | ORAL | 1 refills | Status: DC

## 2020-06-27 NOTE — Progress Notes (Signed)
[facility]  Patient ID: Shelley Clarke,  MRN: [id number]  PCP: Richmond Campbell., PA-C  Date of Evaluation: 06/27/2020  time spent:30 minutes  Chief Complaint:  Chief Complaint   Anxiety; Depression; Follow-up      HISTORY/CURRENT STATUS: HPI For routine med check, her husband [name] is here.   Doing well as far as her psychiatric medications are concerned.  States she does get depressed occasionally, maybe few times since her last visit 3 months ago.  When she starts feeling down like that she just goes to bed and when she gets up the next day she feels better.    She is able to enjoy things.  Has gotten interested in cross stitching again, and working on word search puzzles.  Energy and motivation are good.  She will be having knee replacement surgery in a couple of weeks and is understandably anxious about that, but overall anxiety is stable.  Not isolating.  Sleeps well most of the time.  No suicidal or homicidal thoughts.  Continues to have abnormal movements of her legs, but they are no worse and she feels that they are tolerable.  Patient denies increased energy with decreased need for sleep, no increased talkativeness, no racing thoughts, no impulsivity or risky behaviors, no increased spending, no increased libido, no grandiosity, no increased irritability or anger, and no hallucinations.  Denies dizziness, syncope, seizures, numbness, tingling, tremor, tics, unsteady gait, slurred speech, confusion. Denies muscle or joint pain, stiffness, or dystonia.  Individual Medical History/ Review of Systems: Changes? 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  Allergies: Patient has no known  allergies.  Current Medications:  Current Outpatient Medications:    aspirin EC 81 MG tablet, Take 81 mg by mouth., Disp: , Rfl:    b complex vitamins capsule, Take 1 capsule by mouth daily., Disp: , Rfl:    buPROPion (WELLBUTRIN XL) 300 MG 24 hr tablet, TAKE 1 TABLET BY MOUTH EVERY DAY, Disp: 90 tablet, Rfl: 0   calcium carbonate (OS-CAL) 600 MG TABS tablet, Take 600 mg by mouth 2 (two) times daily with a meal., Disp: , Rfl:    diclofenac Sodium (VOLTAREN) 1 % GEL, Apply 4 g topically 4 (four) times daily., Disp: 100 g, Rfl: 0   FLUoxetine (PROZAC) 40 MG capsule, Take 2 capsules (80 mg total) by mouth daily., Disp: 180 capsule, Rfl: 1   lamoTRIgine (LAMICTAL) 150 MG tablet, TAKE 2 TABLETS BY MOUTH AT BEDTIME., Disp: 180 tablet, Rfl: 1   levothyroxine (SYNTHROID, LEVOTHROID) 100 MCG tablet, Take 100 mcg by mouth See admin instructions. Take Monday - Saturday, Disp: , Rfl:    levothyroxine (SYNTHROID, LEVOTHROID) 112 MCG tablet, Take 112 mcg by mouth See admin instructions. Take Monday -Saturday, Disp: , Rfl:    Multiple Vitamin (MULTIVITAMIN) tablet, Take 1 tablet by mouth daily., Disp: , Rfl:    Omega-3 Fatty Acids (OMEGA 3 500 PO), Take by mouth., Disp: , Rfl:    valACYclovir (VALTREX) 500 MG tablet, Take 500 mg by mouth at bedtime. , Disp: , Rfl:    ziprasidone (GEODON) 40 MG capsule, TAKE 1 CAPSULE EVERY MORNING AND 1 CAPSULE AT LUNCH, Disp: 180 capsule, Rfl: 1   ziprasidone (GEODON) 80 MG capsule, TAKE 2 CAPSULES (160 MG TOTAL) BY MOUTH AT  BEDTIME., Disp: 180 capsule, Rfl: 1   clonazePAM (KLONOPIN) 0.5 MG tablet, 1 po at 8 am, 1 po at 12:30 pm, 2 po at 8 pm, all prn., Disp: 360 tablet, Rfl: 1   morphine (MSIR) 15 MG tablet, Take 0.5 tablets (7.5 mg total) by mouth every 4 (four) hours as needed for severe pain. (Patient not taking: No sig reported), Disp: 7 tablet, Rfl: 0   Thiamine HCl (VITAMIN B1 PO), Take 1 tablet by mouth daily. (Patient not taking: No sig reported), Disp: , Rfl:     UNABLE TO FIND, Immuneti (supplement) (Patient not taking: Reported on 06/27/2020), Disp: , Rfl:  Medication Side Effects: none  Family Medical/ Social History: Changes? No  MENTAL HEALTH EXAM:  There were no vitals taken for this visit.There is no height or weight on file to calculate BMI.  General Appearance: Casual, Neat and Well Groomed  Eye Contact:  Good  Speech:  Clear and Coherent and Normal Rate  Volume:  Normal  Mood:  Euthymic  Affect:  Appropriate  Thought Process:  Goal Directed and Descriptions of Associations: Intact  Orientation:  Full (Time, Place, and Person)  Thought Content: Logical   Suicidal Thoughts:  No  Homicidal Thoughts:  No  Memory:  Recent;   Fair Otherwise normal for the most part.  Judgement:  Good  Insight:  Good  Psychomotor Activity:   Crosses right leg over left, flexes right foot a lot.     Concentration:  Concentration: Good and Attention Span: Good  Recall:  Good  Fund of Knowledge: Good  Language: Good  Assets:  Desire for Improvement  ADL's:  Intact  Cognition: WNL  Prognosis:  Good   06/07/2020 Glucose 84 Lipid panel total cholesterol 210, triglycerides 91, HDL 71, LDL 128, hemoglobin A1c 5.4.  DIAGNOSES:    ICD-10-CM   1. Bipolar I disorder (HCC)  F31.9     2. Mixed obsessional thoughts and acts  F42.2     3. Generalized anxiety disorder  F41.1     4. Tardive dyskinesia  G24.01     5. Memory difficulties  R41.3        Receiving Psychotherapy: No    RECOMMENDATIONS:  PDMP was reviewed. I provided 30 minutes of face to face time during this encounter, including time spent before and after the visit in records review, medical decision making, counseling, and charting.  She is doing well as far as her medications go so no changes are necessary. If she wants to try [place] in the future we can do that.  She did not want to continue Ingrezza because it was not helping. Continue Prozac 40 mg, 2 p.o. daily. Continue Klonopin  0.5 mg, 1 p.o. at 8 AM, 1 p.o. at 12:30 PM, 2 p.o. at 8 PM, all as needed. Continue Wellbutrin XL 300 mg, 1 p.o. every morning. Continue Lamictal 150 mg, 2 p.o. nightly. Continue Geodon 40 mg, 1 p.o. every morning and 1 p.o. at lunch. Continue Geodon 80 mg, 2 p.o. nightly. Return in 4 months.  Melony Overly, PA-C

## 2020-07-02 ENCOUNTER — Other Ambulatory Visit (HOSPITAL_COMMUNITY): Payer: Self-pay

## 2020-07-03 ENCOUNTER — Encounter (HOSPITAL_COMMUNITY)
Admission: RE | Admit: 2020-07-03 | Discharge: 2020-07-03 | Disposition: A | Discharge status: Home / Self Care | Payer: 59 | Source: Ambulatory Visit | Attending: Specialist | Admitting: Specialist

## 2020-07-03 ENCOUNTER — Other Ambulatory Visit: Payer: Self-pay

## 2020-07-03 ENCOUNTER — Encounter (HOSPITAL_COMMUNITY): Payer: Self-pay

## 2020-07-03 DIAGNOSIS — Z01812 Encounter for preprocedural laboratory examination: Secondary | ICD-10-CM | POA: Diagnosis not present

## 2020-07-03 HISTORY — DX: Pneumonia, unspecified organism: J18.9

## 2020-07-03 HISTORY — DX: Family history of other specified conditions: Z84.89

## 2020-07-03 HISTORY — DX: Exercise induced bronchospasm: J45.990

## 2020-07-03 LAB — URINALYSIS, ROUTINE W REFLEX MICROSCOPIC
Bilirubin Urine: NEGATIVE
Glucose, UA: NEGATIVE mg/dL
Hgb urine dipstick: NEGATIVE
Ketones, ur: NEGATIVE mg/dL
Leukocytes,Ua: NEGATIVE
Nitrite: NEGATIVE
Protein, ur: NEGATIVE mg/dL
Specific Gravity, Urine: 1.005 (ref 1.005–1.030)
pH: 8 (ref 5.0–8.0)

## 2020-07-03 LAB — SURGICAL PCR SCREEN
MRSA, PCR: NEGATIVE
Staphylococcus aureus: NEGATIVE

## 2020-07-03 LAB — BASIC METABOLIC PANEL
Anion gap: 9 (ref 5–15)
BUN: 15 mg/dL (ref 8–23)
CO2: 29 mmol/L (ref 22–32)
Calcium: 9.7 mg/dL (ref 8.9–10.3)
Chloride: 102 mmol/L (ref 98–111)
Creatinine, Ser: 1.26 mg/dL — ABNORMAL HIGH (ref 0.44–1.00)
GFR, Estimated: 47 mL/min — ABNORMAL LOW (ref >60)
Glucose, Bld: 101 mg/dL — ABNORMAL HIGH (ref 70–99)
Potassium: 4.1 mmol/L (ref 3.5–5.1)
Sodium: 140 mmol/L (ref 135–145)

## 2020-07-03 LAB — PROTIME-INR
INR: 1 (ref 0.8–1.2)
Prothrombin Time: 13 seconds (ref 11.4–15.2)

## 2020-07-03 LAB — CBC
HCT: 42.1 % (ref 36.0–46.0)
Hemoglobin: 14 g/dL (ref 12.0–15.0)
MCH: 33.1 pg (ref 26.0–34.0)
MCHC: 33.3 g/dL (ref 30.0–36.0)
MCV: 99.5 fL (ref 80.0–100.0)
Platelets: 224 10*3/uL (ref 150–400)
RBC: 4.23 MIL/uL (ref 3.87–5.11)
RDW: 13.1 % (ref 11.5–15.5)
WBC: 6 10*3/uL (ref 4.0–10.5)
nRBC: 0 % (ref 0.0–0.2)

## 2020-07-03 LAB — APTT: aPTT: 27 seconds (ref 24–36)

## 2020-07-03 NOTE — Progress Notes (Signed)
   PCP - Dr Kathleen Lime in epic last office visit 06/07/2020 with clearance noted   Chest x-ray - per epic 09/19/2019   EKG - per chart 06/08/2020  Stress Test - 11/23/2017 per epic  Blood Thinner Instructions: Aspirin Instructions:pt takes 81 mg aspirin last dose 07/02/2020 as stated per pt per MD instruction  Activity level:  Unable to go up a flight of stairs without symptoms    Anesthesia review:   Patient denies shortness of breath, fever, cough and chest pain at PAT appointment   Patient verbalized understanding of instructions that were given to them at the PAT appointment. Patient was also instructed that they will need to review over the PAT instructions again at home before surgery.

## 2020-07-03 NOTE — Patient Instructions (Signed)
DUE TO COVID-19 ONLY ONE VISITOR IS ALLOWED TO COME WITH YOU AND STAY IN THE WAITING ROOM ONLY DURING PRE OP AND PROCEDURE DAY OF SURGERY. THE 1 VISITOR  MAY VISIT WITH YOU AFTER SURGERY IN YOUR PRIVATE ROOM DURING VISITING HOURS ONLY!  YOU NEED TO HAVE A COVID 19 TEST ON_Friday_ @_11 :50 am___, THIS TEST MUST BE DONE BEFORE SURGERY,  COVID TESTING SITE [address] [place] [id number], IT IS ON THE RIGHT GOING OUT [address] APPROXIMATELY  2 MINUTES PAST [employer] ON THE RIGHT. ONCE YOUR COVID TEST IS COMPLETED,  PLEASE BEGIN THE QUARANTINE INSTRUCTIONS AS OUTLINED IN YOUR HANDOUT.                Shelley Clarke  07/03/2020   Your procedure is scheduled on: Wednesday July 10, 2020   [facility]   Report to admitting at 0515 AM     Call this number if you have problems the morning of surgery [phone]    Remember: Do not eat food After Midnight. May take clear liquids from midnight till 0430 consuming entire pre-surgery drink/Ensure by 0430 then nothing by mouth.   BRUSH YOUR TEETH MORNING OF SURGERY AND RINSE YOUR MOUTH OUT, NO CHEWING GUM CANDY OR MINTS.     Take these medicines the morning of surgery with A SIP OF WATER: Bupropion (Wellbutrin), Clonazepam, Fluoxetine (Prozac), Ziprasidone (Geodon), may use albuterol inhaler if needed (bring with you day of surgery)                                 You may not have any metal on your body including hair pins and              piercings  Do not wear jewelry, make-up, lotions, powders or perfumes, deodorant             Do not wear nail polish on your fingernails.  Do not shave  48 hours prior to surgery.           Do not bring valuables to the hospital.  IS NOT             RESPONSIBLE   FOR VALUABLES.  Contacts, dentures or bridgework may not be worn into surgery.  Leave suitcase in the car. After surgery it may be brought to your room.     Patients discharged  the day of surgery will not be allowed to drive home. IF YOU ARE HAVING SURGERY AND GOING HOME THE SAME DAY, YOU MUST HAVE AN ADULT TO DRIVE YOU HOME AND BE WITH YOU FOR 24 HOURS. YOU MAY GO HOME BY TAXI OR UBER OR ORTHERWISE, BUT AN ADULT MUST ACCOMPANY YOU HOME AND STAY WITH YOU FOR 24 HOURS.   _____________________________________________________________________                CLEAR LIQUID DIET   Foods Allowed                                                                     Foods Excluded  Coffee and tea, regular and decaf  liquids that you cannot  Plain Jell-O any favor except red or purple                                           see through such as: Fruit ices (not with fruit pulp)                                     milk, soups, orange juice  Iced Popsicles                                    All solid food Carbonated beverages, regular and diet                                    Cranberry, grape and apple juices Sports drinks like Gatorade Lightly seasoned clear broth or consume(fat free) Sugar, honey syrup  Sample Menu Breakfast                                Lunch                                     Supper Cranberry juice                    Beef broth                            Chicken broth Jell-O                                     Grape juice                           Apple juice Coffee or tea                        Jell-O                                      Popsicle                                                Coffee or tea                        Coffee or tea  _____________________________________________________________________    Incentive Spirometer  An incentive spirometer is a tool that can help keep your lungs clear and active. This tool measures how well you are filling your lungs with each breath. Taking long deep breaths may help reverse or decrease the chance of developing breathing (pulmonary) problems (especially  infection) following: A long period of time when  you are unable to move or be active. BEFORE THE PROCEDURE  If the spirometer includes an indicator to show your best effort, your nurse or respiratory therapist will set it to a desired goal. If possible, sit up straight or lean slightly forward. Try not to slouch. Hold the incentive spirometer in an upright position. INSTRUCTIONS FOR USE  Sit on the edge of your bed if possible, or sit up as far as you can in bed or on a chair. Hold the incentive spirometer in an upright position. Breathe out normally. Place the mouthpiece in your mouth and seal your lips tightly around it. Breathe in slowly and as deeply as possible, raising the piston or the ball toward the top of the column. Hold your breath for 3-5 seconds or for as long as possible. Allow the piston or ball to fall to the bottom of the column. Remove the mouthpiece from your mouth and breathe out normally. Rest for a few seconds and repeat Steps 1 through 7 at least 10 times every 1-2 hours when you are awake. Take your time and take a few normal breaths between deep breaths. The spirometer may include an indicator to show your best effort. Use the indicator as a goal to work toward during each repetition. After each set of 10 deep breaths, practice coughing to be sure your lungs are clear. If you have an incision (the cut made at the time of surgery), support your incision when coughing by placing a pillow or rolled up towels firmly against it. Once you are able to get out of bed, walk around indoors and cough well. You may stop using the incentive spirometer when instructed by your caregiver.  RISKS AND COMPLICATIONS Take your time so you do not get dizzy or light-headed. If you are in pain, you may need to take or ask for pain medication before doing incentive spirometry. It is harder to take a deep breath if you are having pain. AFTER USE Rest and breathe slowly and easily. It can be  helpful to keep track of a log of your progress. Your caregiver can provide you with a simple table to help with this. If you are using the spirometer at home, follow these instructions: SEEK MEDICAL CARE IF:  You are having difficultly using the spirometer. You have trouble using the spirometer as often as instructed. Your pain medication is not giving enough relief while using the spirometer. You develop fever of 100.5 F (38.1 C) or higher. SEEK IMMEDIATE MEDICAL CARE IF:  You cough up bloody sputum that had not been present before. You develop fever of 102 F (38.9 C) or greater. You develop worsening pain at or near the incision site. MAKE SURE YOU:  Understand these instructions. Will watch your condition. Will get help right away if you are not doing well or get worse. Document Released: 05/11/2006 Document Revised: 03/23/2011 Document Reviewed: 07/12/2006 Exit[employer] Patient Information 2014 [employer].   ________________________________________________________________________ [facility] - Preparing for Surgery Before surgery, you can play an important role.  Because skin is not sterile, your skin needs to be as free of germs as possible.  You can reduce the number of germs on your skin by washing with CHG (chlorahexidine gluconate) soap before surgery.  CHG is an antiseptic cleaner which kills germs and bonds with the skin to continue killing germs even after washing. Please DO NOT use if you have an allergy to CHG or antibacterial soaps.  If your skin becomes reddened/irritated  stop using the CHG and inform your nurse when you arrive at Short Stay. Do not shave (including legs and underarms) for at least 48 hours prior to the first CHG shower.  You may shave your face/neck. Please follow these instructions carefully:  1.  Shower with CHG Soap the night before surgery and the  morning of Surgery.  2.  If you choose to wash your hair, wash your hair first as usual with your   normal  shampoo.  3.  After you shampoo, rinse your hair and body thoroughly to remove the  shampoo.                           4.  Use CHG as you would any other liquid soap.  You can apply chg directly  to the skin and wash                       Gently with a scrungie or clean washcloth.  5.  Apply the CHG Soap to your body ONLY FROM THE NECK DOWN.   Do not use on face/ open                           Wound or open sores. Avoid contact with eyes, ears mouth and genitals (private parts).                       Wash face,  Genitals (private parts) with your normal soap.             6.  Wash thoroughly, paying special attention to the area where your surgery  will be performed.  7.  Thoroughly rinse your body with warm water from the neck down.  8.  DO NOT shower/wash with your normal soap after using and rinsing off  the CHG Soap.                9.  Pat yourself dry with a clean towel.            10.  Wear clean pajamas.            11.  Place clean sheets on your bed the night of your first shower and do not  sleep with pets. Day of Surgery : Do not apply any lotions/deodorants the morning of surgery.  Please wear clean clothes to the hospital/surgery center.  FAILURE TO FOLLOW THESE INSTRUCTIONS MAY RESULT IN THE CANCELLATION OF YOUR SURGERY PATIENT SIGNATURE_________________________________  NURSE SIGNATURE__________________________________  ________________________________________________________________________

## 2020-07-04 ENCOUNTER — Ambulatory Visit: Payer: Self-pay | Admitting: Orthopedic Surgery

## 2020-07-04 NOTE — H&P (Signed)
Shelley Clarke is an 70 y.o. female.   Chief Complaint: Right knee pain HPI: The patient is here for her H&P. The patient is scheduled for a right total knee replacement by Dr. Shelle Iron on 07/10/20 at [facility].  Patient has failed conservative treatment and has pain and instability symptoms interfering with her quality-of-life and activities of daily living. She desires to proceed with a total knee replacement.  Dr. Shelle Iron and the patient mutually agreed to proceed with a total knee replacement. Risks and benefits of the procedure were discussed including stiffness, suboptimal range of motion, persistent pain, infection requiring removal of prosthesis and reinsertion, need for prophylactic antibiotics in the future, for example, dental procedures, possible need for manipulation, revision in the future and also anesthetic complications including DVT, PE, etc. We discussed the perioperative course, time in the hospital, postoperative recovery and the need for elevation to control swelling. We also discussed the predicted range of motion and the probability that squatting and kneeling would be unobtainable in the future. In addition, postoperative anticoagulation was discussed. We have obtained preoperative medical clearance as necessary. Provided illustrated handout and discussed it in detail. They will enroll in the total joint replacement educational forum at the hospital. She was cleared by her PCP.  She has long-standing issues with anxiety and will require premedication prior to her arrival at the hospital as well as anxiety meds to undergo spinal anesthesia. She has recently had blood work through her PCP so should not need a repeat CBC or metabolic panel is those are within normal limits.  Past Medical History:  Diagnosis Date   Atypical chest pain 11/15/2017   Bipolar disorder (HCC)    Depression    Exercise-induced asthma    Family history of adverse reaction to anesthesia     Hypothyroidism    Pneumonia     Past Surgical History:  Procedure Laterality Date   ABDOMINAL HYSTERECTOMY     BREAST LUMPECTOMY Left    CESAREAN SECTION     times 2    Family History  Problem Relation Age of Onset   Breast cancer Mother 41   Hypertension Mother    Diabetes Mother    Luiz Blare' disease Sister    Heart attack Father    Brain cancer Maternal Grandmother    Kidney disease Maternal Grandfather        Bright's disease   Stroke Paternal Grandmother    Endometriosis Child    Hypertension Child    Social History:  reports that she has never smoked. She has never used smokeless tobacco. She reports previous alcohol use. She reports that she does not use drugs.  Allergies: No Known Allergies  Current Medications: buPROPion HCL XL 300 mg 24 hr tablet, extended release clonazePAM 0.5 mg tablet diazePAM 5 mg tablet diclofenac 1 % topical gel fluconazole 200 mg tablet FLUoxetine 40 mg capsule lamoTRIgine 150 mg tablet naproxen 500 mg tablet Synthroid 100 mcg tablet Synthroid 112 mcg tablet valACYclovir 500 mg tablet Valium 10 mg tablet ziprasidone 40 mg capsule ziprasidone 80 mg capsule   Results for orders placed or performed during the hospital encounter of 07/03/20 (from the past 48 hour(s))  Surgical pcr screen     Status: None   Collection Time: 07/03/20 12:02 PM   Specimen: Nasal Mucosa; Nasal Swab  Result Value Ref Range   MRSA, PCR NEGATIVE NEGATIVE   Staphylococcus aureus NEGATIVE NEGATIVE    Comment: (NOTE) The Xpert SA Assay (FDA approved for NASAL  specimens in patients 32 years of age and older), is one component of a comprehensive surveillance program. It is not intended to diagnose infection nor to guide or monitor treatment. Performed at [facility], [address], [place], [place] [address]   CBC     Status: None   Collection Time: 07/03/20 12:02 PM  Result Value Ref Range   WBC 6.0 4.0 - 10.5 K/uL   RBC 4.23 3.87  - 5.11 MIL/uL   Hemoglobin 14.0 12.0 - 15.0 g/dL   HCT 88.4 16.6 - 06.3 %   MCV 99.5 80.0 - 100.0 fL   MCH 33.1 26.0 - 34.0 pg   MCHC 33.3 30.0 - 36.0 g/dL   RDW 01.6 01.0 - 93.2 %   Platelets 224 150 - 400 K/uL   nRBC 0.0 0.0 - 0.2 %    Comment: Performed at [facility], [address], [place], [place] [address]  Basic metabolic panel     Status: Abnormal   Collection Time: 07/03/20 12:02 PM  Result Value Ref Range   Sodium 140 135 - 145 mmol/L   Potassium 4.1 3.5 - 5.1 mmol/L   Chloride 102 98 - 111 mmol/L   CO2 29 22 - 32 mmol/L   Glucose, Bld 101 (H) 70 - 99 mg/dL    Comment: Glucose reference range applies only to samples taken after fasting for at least 8 hours.   BUN 15 8 - 23 mg/dL   Creatinine, Ser 2.20 (H) 0.44 - 1.00 mg/dL   Calcium 9.7 8.9 - 25.4 mg/dL   GFR, Estimated 47 (L) >60 mL/min    Comment: (NOTE) Calculated using the CKD-EPI Creatinine Equation (2021)    Anion gap 9 5 - 15    Comment: Performed at [facility], [address] [place], [place] [address]  Protime-INR     Status: None   Collection Time: 07/03/20 12:02 PM  Result Value Ref Range   Prothrombin Time 13.0 11.4 - 15.2 seconds   INR 1.0 0.8 - 1.2    Comment: (NOTE) INR goal varies based on device and disease states. Performed at [facility], [address], [place], [place] [address]   APTT     Status: None   Collection Time: 07/03/20 12:02 PM  Result Value Ref Range   aPTT 27 24 - 36 seconds    Comment: Performed at [facility], [address] [place] [address] [place], [place] [address]  Urinalysis, Routine w reflex microscopic     Status: Abnormal   Collection Time: 07/03/20 12:02 PM  Result Value Ref Range   Color, Urine STRAW (A) YELLOW   APPearance CLEAR CLEAR   Specific Gravity, Urine 1.005 1.005 - 1.030   pH 8.0 5.0 - 8.0   Glucose, UA NEGATIVE NEGATIVE mg/dL   Hgb urine dipstick NEGATIVE NEGATIVE   Bilirubin Urine  NEGATIVE NEGATIVE   Ketones, ur NEGATIVE NEGATIVE mg/dL   Protein, ur NEGATIVE NEGATIVE mg/dL   Nitrite NEGATIVE NEGATIVE   Leukocytes,Ua NEGATIVE NEGATIVE    Comment: Performed at [facility], [address], [place], [place] [address]   No results found.  Review of Systems  Constitutional: Negative.   HENT: Negative.    Eyes: Negative.   Respiratory: Negative.    Cardiovascular: Negative.   Gastrointestinal: Negative.   Endocrine: Negative.   Genitourinary: Negative.   Musculoskeletal:  Positive for arthralgias and joint swelling.  Skin: Negative.   Neurological: Negative.  There were no vitals taken for this visit. Physical Exam Constitutional:      Appearance: Normal appearance.  HENT:     Head: Normocephalic and atraumatic.     Right Ear: External ear normal.     Left Ear: External ear normal.     Nose: Nose normal.     Mouth/Throat:     Pharynx: Oropharynx is clear.  Eyes:     Conjunctiva/sclera: Conjunctivae normal.  Cardiovascular:     Rate and Rhythm: Normal rate.     Pulses: Normal pulses.     Heart sounds: Normal heart sounds.  Pulmonary:     Effort: Pulmonary effort is normal.     Breath sounds: Normal breath sounds.  Abdominal:     General: Bowel sounds are normal.  Musculoskeletal:     Cervical back: Normal range of motion.     Comments: Tender lateral joint line. Mild effusion. Patellofemoral pain compression. Ranges 0-120. Ipsilateral hip and ankle exam is unremarkable  Skin:    General: Skin is warm and dry.  Neurological:     Mental Status: She is alert.     Assessment/Plan Impression: Right knee end-stage osteoarthritis  Plan:Pt with end-stage right knee DJD, bone-on-bone, refractory to conservative tx, scheduled for right total knee replacement by Dr. Shelle Iron on June 29. We again discussed the procedure itself as well as risks, complications and alternatives, including but not limited to DVT, PE, infx, bleeding, failure  of procedure, need for secondary procedure including manipulation, nerve injury, ongoing pain/symptoms, anesthesia risk, even stroke or death. Also discussed typical post-op protocols, activity restrictions, need for PT, flexion/extension exercises, time out of work. Discussed need for DVT ppx post-op per protocol. Discussed dental ppx and infx prevention. Also discussed limitations post-operatively such as kneeling and squatting. All questions were answered. Patient desires to proceed with surgery as scheduled.  Will hold supplements, ASA and NSAIDs accordingly. Will remain NPO after midnight the night before surgery. Will present to [facility] for pre-op testing. Anticipate hospital stay to include at least 2 midnights given medical history and to ensure proper pain control. Plan aspirin for DVT ppx post-op. Plan oxycodone, Robaxin, Colace, Miralax. Plan home with HHPT post-op with family members at home for assistance. Will follow up 10-14 days post-op for suture removal and xrays.  I have provided her with Valium to take prior to her arrival for anxiety prn.  Plan Right total knee replacement  Dorothy Spark, PA-C for Dr. Shelle Iron 07/04/2020, 1:30 PM

## 2020-07-04 NOTE — H&P (View-Only) (Signed)
Shelley Clarke is an 70 y.o. female.   Chief Complaint: Right knee pain HPI: The patient is here for her H&P. The patient is scheduled for a right total knee replacement by Dr. Shelle Iron on 07/10/20 at [facility].  Patient has failed conservative treatment and has pain and instability symptoms interfering with her quality-of-life and activities of daily living. She desires to proceed with a total knee replacement.  Dr. Shelle Iron and the patient mutually agreed to proceed with a total knee replacement. Risks and benefits of the procedure were discussed including stiffness, suboptimal range of motion, persistent pain, infection requiring removal of prosthesis and reinsertion, need for prophylactic antibiotics in the future, for example, dental procedures, possible need for manipulation, revision in the future and also anesthetic complications including DVT, PE, etc. We discussed the perioperative course, time in the hospital, postoperative recovery and the need for elevation to control swelling. We also discussed the predicted range of motion and the probability that squatting and kneeling would be unobtainable in the future. In addition, postoperative anticoagulation was discussed. We have obtained preoperative medical clearance as necessary. Provided illustrated handout and discussed it in detail. They will enroll in the total joint replacement educational forum at the hospital. She was cleared by her PCP.  She has long-standing issues with anxiety and will require premedication prior to her arrival at the hospital as well as anxiety meds to undergo spinal anesthesia. She has recently had blood work through her PCP so should not need a repeat CBC or metabolic panel is those are within normal limits.  Past Medical History:  Diagnosis Date   Atypical chest pain 11/15/2017   Bipolar disorder (HCC)    Depression    Exercise-induced asthma    Family history of adverse reaction to anesthesia     Hypothyroidism    Pneumonia     Past Surgical History:  Procedure Laterality Date   ABDOMINAL HYSTERECTOMY     BREAST LUMPECTOMY Left    CESAREAN SECTION     times 2    Family History  Problem Relation Age of Onset   Breast cancer Mother 41   Hypertension Mother    Diabetes Mother    Luiz Blare' disease Sister    Heart attack Father    Brain cancer Maternal Grandmother    Kidney disease Maternal Grandfather        Bright's disease   Stroke Paternal Grandmother    Endometriosis Child    Hypertension Child    Social History:  reports that she has never smoked. She has never used smokeless tobacco. She reports previous alcohol use. She reports that she does not use drugs.  Allergies: No Known Allergies  Current Medications: buPROPion HCL XL 300 mg 24 hr tablet, extended release clonazePAM 0.5 mg tablet diazePAM 5 mg tablet diclofenac 1 % topical gel fluconazole 200 mg tablet FLUoxetine 40 mg capsule lamoTRIgine 150 mg tablet naproxen 500 mg tablet Synthroid 100 mcg tablet Synthroid 112 mcg tablet valACYclovir 500 mg tablet Valium 10 mg tablet ziprasidone 40 mg capsule ziprasidone 80 mg capsule   Results for orders placed or performed during the hospital encounter of 07/03/20 (from the past 48 hour(s))  Surgical pcr screen     Status: None   Collection Time: 07/03/20 12:02 PM   Specimen: Nasal Mucosa; Nasal Swab  Result Value Ref Range   MRSA, PCR NEGATIVE NEGATIVE   Staphylococcus aureus NEGATIVE NEGATIVE    Comment: (NOTE) The Xpert SA Assay (FDA approved for NASAL  specimens in patients 32 years of age and older), is one component of a comprehensive surveillance program. It is not intended to diagnose infection nor to guide or monitor treatment. Performed at [facility], [address], [place], [place] [address]   CBC     Status: None   Collection Time: 07/03/20 12:02 PM  Result Value Ref Range   WBC 6.0 4.0 - 10.5 K/uL   RBC 4.23 3.87  - 5.11 MIL/uL   Hemoglobin 14.0 12.0 - 15.0 g/dL   HCT 88.4 16.6 - 06.3 %   MCV 99.5 80.0 - 100.0 fL   MCH 33.1 26.0 - 34.0 pg   MCHC 33.3 30.0 - 36.0 g/dL   RDW 01.6 01.0 - 93.2 %   Platelets 224 150 - 400 K/uL   nRBC 0.0 0.0 - 0.2 %    Comment: Performed at [facility], [address], [place], [place] [address]  Basic metabolic panel     Status: Abnormal   Collection Time: 07/03/20 12:02 PM  Result Value Ref Range   Sodium 140 135 - 145 mmol/L   Potassium 4.1 3.5 - 5.1 mmol/L   Chloride 102 98 - 111 mmol/L   CO2 29 22 - 32 mmol/L   Glucose, Bld 101 (H) 70 - 99 mg/dL    Comment: Glucose reference range applies only to samples taken after fasting for at least 8 hours.   BUN 15 8 - 23 mg/dL   Creatinine, Ser 2.20 (H) 0.44 - 1.00 mg/dL   Calcium 9.7 8.9 - 25.4 mg/dL   GFR, Estimated 47 (L) >60 mL/min    Comment: (NOTE) Calculated using the CKD-EPI Creatinine Equation (2021)    Anion gap 9 5 - 15    Comment: Performed at [facility], [address] [place], [place] [address]  Protime-INR     Status: None   Collection Time: 07/03/20 12:02 PM  Result Value Ref Range   Prothrombin Time 13.0 11.4 - 15.2 seconds   INR 1.0 0.8 - 1.2    Comment: (NOTE) INR goal varies based on device and disease states. Performed at [facility], [address], [place], [place] [address]   APTT     Status: None   Collection Time: 07/03/20 12:02 PM  Result Value Ref Range   aPTT 27 24 - 36 seconds    Comment: Performed at [facility], [address] [place] [address] [place], [place] [address]  Urinalysis, Routine w reflex microscopic     Status: Abnormal   Collection Time: 07/03/20 12:02 PM  Result Value Ref Range   Color, Urine STRAW (A) YELLOW   APPearance CLEAR CLEAR   Specific Gravity, Urine 1.005 1.005 - 1.030   pH 8.0 5.0 - 8.0   Glucose, UA NEGATIVE NEGATIVE mg/dL   Hgb urine dipstick NEGATIVE NEGATIVE   Bilirubin Urine  NEGATIVE NEGATIVE   Ketones, ur NEGATIVE NEGATIVE mg/dL   Protein, ur NEGATIVE NEGATIVE mg/dL   Nitrite NEGATIVE NEGATIVE   Leukocytes,Ua NEGATIVE NEGATIVE    Comment: Performed at [facility], [address], [place], [place] [address]   No results found.  Review of Systems  Constitutional: Negative.   HENT: Negative.    Eyes: Negative.   Respiratory: Negative.    Cardiovascular: Negative.   Gastrointestinal: Negative.   Endocrine: Negative.   Genitourinary: Negative.   Musculoskeletal:  Positive for arthralgias and joint swelling.  Skin: Negative.   Neurological: Negative.  There were no vitals taken for this visit. Physical Exam Constitutional:      Appearance: Normal appearance.  HENT:     Head: Normocephalic and atraumatic.     Right Ear: External ear normal.     Left Ear: External ear normal.     Nose: Nose normal.     Mouth/Throat:     Pharynx: Oropharynx is clear.  Eyes:     Conjunctiva/sclera: Conjunctivae normal.  Cardiovascular:     Rate and Rhythm: Normal rate.     Pulses: Normal pulses.     Heart sounds: Normal heart sounds.  Pulmonary:     Effort: Pulmonary effort is normal.     Breath sounds: Normal breath sounds.  Abdominal:     General: Bowel sounds are normal.  Musculoskeletal:     Cervical back: Normal range of motion.     Comments: Tender lateral joint line. Mild effusion. Patellofemoral pain compression. Ranges 0-120. Ipsilateral hip and ankle exam is unremarkable  Skin:    General: Skin is warm and dry.  Neurological:     Mental Status: She is alert.     Assessment/Plan Impression: Right knee end-stage osteoarthritis  Plan:Pt with end-stage right knee DJD, bone-on-bone, refractory to conservative tx, scheduled for right total knee replacement by Dr. Shelle Iron on June 29. We again discussed the procedure itself as well as risks, complications and alternatives, including but not limited to DVT, PE, infx, bleeding, failure  of procedure, need for secondary procedure including manipulation, nerve injury, ongoing pain/symptoms, anesthesia risk, even stroke or death. Also discussed typical post-op protocols, activity restrictions, need for PT, flexion/extension exercises, time out of work. Discussed need for DVT ppx post-op per protocol. Discussed dental ppx and infx prevention. Also discussed limitations post-operatively such as kneeling and squatting. All questions were answered. Patient desires to proceed with surgery as scheduled.  Will hold supplements, ASA and NSAIDs accordingly. Will remain NPO after midnight the night before surgery. Will present to [facility] for pre-op testing. Anticipate hospital stay to include at least 2 midnights given medical history and to ensure proper pain control. Plan aspirin for DVT ppx post-op. Plan oxycodone, Robaxin, Colace, Miralax. Plan home with HHPT post-op with family members at home for assistance. Will follow up 10-14 days post-op for suture removal and xrays.  I have provided her with Valium to take prior to her arrival for anxiety prn.  Plan Right total knee replacement  Dorothy Spark, PA-C for Dr. Shelle Iron 07/04/2020, 1:30 PM

## 2020-07-05 ENCOUNTER — Other Ambulatory Visit (HOSPITAL_COMMUNITY)
Admission: RE | Admit: 2020-07-05 | Discharge: 2020-07-05 | Disposition: A | Discharge status: Home / Self Care | Payer: 59 | Source: Ambulatory Visit | Attending: Specialist | Admitting: Specialist

## 2020-07-05 DIAGNOSIS — Z20822 Contact with and (suspected) exposure to covid-19: Secondary | ICD-10-CM | POA: Insufficient documentation

## 2020-07-05 DIAGNOSIS — Z01812 Encounter for preprocedural laboratory examination: Secondary | ICD-10-CM | POA: Insufficient documentation

## 2020-07-05 LAB — SARS CORONAVIRUS 2 (TAT 6-24 HRS): SARS Coronavirus 2: NEGATIVE

## 2020-07-09 NOTE — Anesthesia Preprocedure Evaluation (Addendum)
Anesthesia Evaluation  Patient identified by MRN, date of birth, ID band Patient awake    Reviewed: Allergy & Precautions, NPO status , Patient's Chart, lab work & pertinent test results  History of Anesthesia Complications (+) Family history of anesthesia reaction  Airway Mallampati: I  TM Distance: >3 FB Neck ROM: Full    Dental  (+) Dental Advisory Given, Teeth Intact   Pulmonary asthma , pneumonia,    Pulmonary exam normal breath sounds clear to auscultation       Cardiovascular negative cardio ROS Normal cardiovascular exam Rhythm:Regular Rate:Normal     Neuro/Psych PSYCHIATRIC DISORDERS Anxiety Depression Bipolar Disorder negative neurological ROS     GI/Hepatic negative GI ROS, Neg liver ROS,   Endo/Other  Hypothyroidism   Renal/GU Renal disease     Musculoskeletal negative musculoskeletal ROS (+)   Abdominal   Peds  Hematology negative hematology ROS (+)   Anesthesia Other Findings   Reproductive/Obstetrics                            Anesthesia Physical Anesthesia Plan  ASA: 2  Anesthesia Plan: Spinal   Post-op Pain Management:  Regional for Post-op pain   Induction: Intravenous  PONV Risk Score and Plan: 3 and Ondansetron, Dexamethasone, Propofol infusion, TIVA and Treatment may vary due to age or medical condition  Airway Management Planned: Natural Airway  Additional Equipment:   Intra-op Plan:   Post-operative Plan:   Informed Consent: I have reviewed the patients History and Physical, chart, labs and discussed the procedure including the risks, benefits and alternatives for the proposed anesthesia with the patient or authorized representative who has indicated his/her understanding and acceptance.     Dental advisory given  Plan Discussed with: CRNA  Anesthesia Plan Comments:        Anesthesia Quick Evaluation

## 2020-07-10 ENCOUNTER — Inpatient Hospital Stay (HOSPITAL_COMMUNITY): Payer: Medicare Other | Admitting: Physician Assistant

## 2020-07-10 ENCOUNTER — Inpatient Hospital Stay (HOSPITAL_COMMUNITY)
Admission: RE | Admit: 2020-07-10 | Discharge: 2020-07-15 | DRG: 470 | Disposition: A | Discharge status: Home Health | Payer: Medicare Other | Attending: Specialist | Admitting: Specialist

## 2020-07-10 ENCOUNTER — Inpatient Hospital Stay (HOSPITAL_COMMUNITY): Payer: Medicare Other | Admitting: Anesthesiology

## 2020-07-10 ENCOUNTER — Encounter (HOSPITAL_COMMUNITY)
Admission: RE | Disposition: A | Discharge status: Home Health | Payer: Self-pay | Source: Home / Self Care | Attending: Specialist

## 2020-07-10 ENCOUNTER — Other Ambulatory Visit (HOSPITAL_COMMUNITY): Payer: Self-pay

## 2020-07-10 ENCOUNTER — Inpatient Hospital Stay (HOSPITAL_COMMUNITY): Payer: Medicare Other

## 2020-07-10 ENCOUNTER — Encounter (HOSPITAL_COMMUNITY): Payer: Self-pay | Admitting: Specialist

## 2020-07-10 ENCOUNTER — Other Ambulatory Visit: Payer: Self-pay

## 2020-07-10 DIAGNOSIS — Z803 Family history of malignant neoplasm of breast: Secondary | ICD-10-CM

## 2020-07-10 DIAGNOSIS — F419 Anxiety disorder, unspecified: Secondary | ICD-10-CM | POA: Diagnosis present

## 2020-07-10 DIAGNOSIS — E669 Obesity, unspecified: Secondary | ICD-10-CM | POA: Diagnosis present

## 2020-07-10 DIAGNOSIS — Z8249 Family history of ischemic heart disease and other diseases of the circulatory system: Secondary | ICD-10-CM

## 2020-07-10 DIAGNOSIS — I951 Orthostatic hypotension: Secondary | ICD-10-CM | POA: Diagnosis not present

## 2020-07-10 DIAGNOSIS — Z6820 Body mass index (BMI) 20.0-20.9, adult: Secondary | ICD-10-CM

## 2020-07-10 DIAGNOSIS — K5909 Other constipation: Secondary | ICD-10-CM | POA: Diagnosis present

## 2020-07-10 DIAGNOSIS — Z79899 Other long term (current) drug therapy: Secondary | ICD-10-CM | POA: Diagnosis not present

## 2020-07-10 DIAGNOSIS — M1711 Unilateral primary osteoarthritis, right knee: Principal | ICD-10-CM | POA: Diagnosis present

## 2020-07-10 DIAGNOSIS — F319 Bipolar disorder, unspecified: Secondary | ICD-10-CM | POA: Diagnosis present

## 2020-07-10 DIAGNOSIS — Z79890 Hormone replacement therapy (postmenopausal): Secondary | ICD-10-CM | POA: Diagnosis not present

## 2020-07-10 DIAGNOSIS — Z833 Family history of diabetes mellitus: Secondary | ICD-10-CM | POA: Diagnosis not present

## 2020-07-10 DIAGNOSIS — E039 Hypothyroidism, unspecified: Secondary | ICD-10-CM | POA: Diagnosis present

## 2020-07-10 DIAGNOSIS — Z808 Family history of malignant neoplasm of other organs or systems: Secondary | ICD-10-CM

## 2020-07-10 DIAGNOSIS — Z823 Family history of stroke: Secondary | ICD-10-CM | POA: Diagnosis not present

## 2020-07-10 DIAGNOSIS — Z96659 Presence of unspecified artificial knee joint: Secondary | ICD-10-CM

## 2020-07-10 HISTORY — PX: TOTAL KNEE ARTHROPLASTY: SHX125

## 2020-07-10 SURGERY — ARTHROPLASTY, KNEE, TOTAL
Anesthesia: Spinal | Site: Knee | Laterality: Right

## 2020-07-10 MED ORDER — LEVOTHYROXINE SODIUM 112 MCG PO TABS
112.0000 ug | ORAL_TABLET | ORAL | Status: DC
  Administered 2020-07-11 – 2020-07-15 (×4): 112 ug via ORAL
  Filled 2020-07-10 (×4): qty 1

## 2020-07-10 MED ORDER — IRRISEPT - 450ML BOTTLE WITH 0.05% CHG IN STERILE WATER, USP 99.95% OPTIME
TOPICAL | Status: DC | PRN
Start: 2020-07-10 — End: 2020-07-10
  Administered 2020-07-10: 450 mL

## 2020-07-10 MED ORDER — LACTATED RINGERS IV SOLN: INTRAVENOUS | Status: DC

## 2020-07-10 MED ORDER — EPHEDRINE 5 MG/ML INJ
INTRAVENOUS | Status: AC
  Filled 2020-07-10: qty 10

## 2020-07-10 MED ORDER — DEXAMETHASONE SODIUM PHOSPHATE 10 MG/ML IJ SOLN
INTRAMUSCULAR | Status: DC | PRN
  Administered 2020-07-10: 8 mg via INTRAVENOUS

## 2020-07-10 MED ORDER — SODIUM CHLORIDE 0.9 % IR SOLN
Status: DC | PRN
  Administered 2020-07-10: 1000 mL

## 2020-07-10 MED ORDER — ACETAMINOPHEN 500 MG PO TABS
500.0000 mg | ORAL_TABLET | Freq: Four times a day (QID) | ORAL | Status: AC
  Administered 2020-07-10 – 2020-07-11 (×3): 500 mg via ORAL
  Filled 2020-07-10 (×4): qty 1

## 2020-07-10 MED ORDER — MORPHINE SULFATE (PF) 2 MG/ML IV SOLN: 0.5000 mg | INTRAVENOUS | Status: DC | PRN

## 2020-07-10 MED ORDER — SODIUM CHLORIDE 0.9% FLUSH
INTRAVENOUS | Status: DC | PRN
Start: 2020-07-10 — End: 2020-07-10
  Administered 2020-07-10: 31 mL

## 2020-07-10 MED ORDER — POLYETHYLENE GLYCOL 3350 17 G PO PACK
17.0000 g | PACK | Freq: Every day | ORAL | Status: DC | PRN
  Administered 2020-07-13 – 2020-07-14 (×2): 17 g via ORAL
  Filled 2020-07-10 (×2): qty 1

## 2020-07-10 MED ORDER — DEXAMETHASONE SODIUM PHOSPHATE 10 MG/ML IJ SOLN
INTRAMUSCULAR | Status: AC
  Filled 2020-07-10: qty 1

## 2020-07-10 MED ORDER — CLONAZEPAM 1 MG PO TABS
1.0000 mg | ORAL_TABLET | Freq: Every day | ORAL | Status: DC
  Administered 2020-07-10 – 2020-07-14 (×5): 1 mg via ORAL
  Filled 2020-07-10 (×5): qty 1

## 2020-07-10 MED ORDER — ACETAMINOPHEN 10 MG/ML IV SOLN
1000.0000 mg | INTRAVENOUS | Status: AC
  Administered 2020-07-10: 1000 mg via INTRAVENOUS
  Filled 2020-07-10: qty 100

## 2020-07-10 MED ORDER — ASPIRIN EC 81 MG PO TBEC
81.0000 mg | DELAYED_RELEASE_TABLET | Freq: Two times a day (BID) | ORAL | 1 refills | Status: AC
  Filled 2020-07-10: qty 60, 30d supply, fill #0

## 2020-07-10 MED ORDER — PROPOFOL 500 MG/50ML IV EMUL
INTRAVENOUS | Status: DC | PRN
  Administered 2020-07-10: 40 ug/kg/min via INTRAVENOUS

## 2020-07-10 MED ORDER — VALACYCLOVIR HCL 500 MG PO TABS
500.0000 mg | ORAL_TABLET | Freq: Every day | ORAL | Status: DC
  Administered 2020-07-10 – 2020-07-14 (×5): 500 mg via ORAL
  Filled 2020-07-10 (×5): qty 1

## 2020-07-10 MED ORDER — PROMETHAZINE HCL 25 MG/ML IJ SOLN: 6.2500 mg | INTRAMUSCULAR | Status: DC | PRN

## 2020-07-10 MED ORDER — BUPIVACAINE-EPINEPHRINE 0.25% -1:200000 IJ SOLN
INTRAMUSCULAR | Status: DC | PRN
  Administered 2020-07-10: 19 mL

## 2020-07-10 MED ORDER — BUPIVACAINE LIPOSOME 1.3 % IJ SUSP
INTRAMUSCULAR | Status: DC | PRN
  Administered 2020-07-10: 16 mL

## 2020-07-10 MED ORDER — BUPROPION HCL ER (XL) 300 MG PO TB24
300.0000 mg | ORAL_TABLET | Freq: Every morning | ORAL | Status: DC
  Administered 2020-07-11 – 2020-07-15 (×5): 300 mg via ORAL
  Filled 2020-07-10 (×5): qty 1

## 2020-07-10 MED ORDER — DOCUSATE SODIUM 100 MG PO CAPS
100.0000 mg | ORAL_CAPSULE | Freq: Two times a day (BID) | ORAL | Status: DC
  Administered 2020-07-10 – 2020-07-15 (×10): 100 mg via ORAL
  Filled 2020-07-10 (×10): qty 1

## 2020-07-10 MED ORDER — ALBUTEROL SULFATE (2.5 MG/3ML) 0.083% IN NEBU: 2.5000 mg | INHALATION_SOLUTION | Freq: Four times a day (QID) | RESPIRATORY_TRACT | Status: DC | PRN

## 2020-07-10 MED ORDER — DOCUSATE SODIUM 100 MG PO CAPS
100.0000 mg | ORAL_CAPSULE | Freq: Two times a day (BID) | ORAL | 1 refills | Status: DC | PRN
  Filled 2020-07-10: qty 30, 15d supply, fill #0

## 2020-07-10 MED ORDER — ACETAMINOPHEN 325 MG PO TABS
325.0000 mg | ORAL_TABLET | Freq: Four times a day (QID) | ORAL | Status: DC | PRN
  Administered 2020-07-11 – 2020-07-14 (×3): 650 mg via ORAL
  Filled 2020-07-10 (×3): qty 2

## 2020-07-10 MED ORDER — MAGNESIUM CITRATE PO SOLN
1.0000 | Freq: Once | ORAL | Status: AC | PRN
  Administered 2020-07-14: 1 via ORAL
  Filled 2020-07-10: qty 296

## 2020-07-10 MED ORDER — BUPIVACAINE-EPINEPHRINE (PF) 0.25% -1:200000 IJ SOLN
INTRAMUSCULAR | Status: AC
  Filled 2020-07-10: qty 30

## 2020-07-10 MED ORDER — ONDANSETRON HCL 4 MG PO TABS
4.0000 mg | ORAL_TABLET | Freq: Four times a day (QID) | ORAL | Status: DC | PRN
Start: 2020-07-10 — End: 2020-07-15

## 2020-07-10 MED ORDER — DIPHENHYDRAMINE HCL 12.5 MG/5ML PO ELIX: 12.5000 mg | ORAL_SOLUTION | ORAL | Status: DC | PRN

## 2020-07-10 MED ORDER — 0.9 % SODIUM CHLORIDE (POUR BTL) OPTIME
TOPICAL | Status: DC | PRN
  Administered 2020-07-10: 1000 mL

## 2020-07-10 MED ORDER — MENTHOL 3 MG MT LOZG: 1.0000 | LOZENGE | OROMUCOSAL | Status: DC | PRN

## 2020-07-10 MED ORDER — ALUM & MAG HYDROXIDE-SIMETH 200-200-20 MG/5ML PO SUSP: 30.0000 mL | ORAL | Status: DC | PRN

## 2020-07-10 MED ORDER — METOCLOPRAMIDE HCL 5 MG PO TABS: 5.0000 mg | ORAL_TABLET | Freq: Three times a day (TID) | ORAL | Status: DC | PRN

## 2020-07-10 MED ORDER — CEFAZOLIN SODIUM-DEXTROSE 1-4 GM/50ML-% IV SOLN
1.0000 g | Freq: Four times a day (QID) | INTRAVENOUS | Status: AC
  Administered 2020-07-10 (×2): 1 g via INTRAVENOUS
  Filled 2020-07-10 (×3): qty 50

## 2020-07-10 MED ORDER — FENTANYL CITRATE (PF) 100 MCG/2ML IJ SOLN
INTRAMUSCULAR | Status: AC
  Filled 2020-07-10: qty 2

## 2020-07-10 MED ORDER — HYDROMORPHONE HCL 1 MG/ML IJ SOLN: 0.2500 mg | INTRAMUSCULAR | Status: DC | PRN

## 2020-07-10 MED ORDER — CEFAZOLIN SODIUM-DEXTROSE 2-4 GM/100ML-% IV SOLN
2.0000 g | INTRAVENOUS | Status: AC
  Administered 2020-07-10: 2 g via INTRAVENOUS
  Filled 2020-07-10: qty 100

## 2020-07-10 MED ORDER — HYDROCODONE-ACETAMINOPHEN 7.5-325 MG PO TABS
1.0000 | ORAL_TABLET | ORAL | Status: DC | PRN
  Administered 2020-07-10: 2 via ORAL
  Administered 2020-07-10: 1 via ORAL
  Administered 2020-07-12 (×2): 2 via ORAL
  Filled 2020-07-10 (×4): qty 2

## 2020-07-10 MED ORDER — METOCLOPRAMIDE HCL 5 MG/ML IJ SOLN: 5.0000 mg | Freq: Three times a day (TID) | INTRAMUSCULAR | Status: DC | PRN

## 2020-07-10 MED ORDER — FLUOXETINE HCL 20 MG PO CAPS
80.0000 mg | ORAL_CAPSULE | Freq: Every day | ORAL | Status: DC
  Administered 2020-07-11 – 2020-07-15 (×5): 80 mg via ORAL
  Filled 2020-07-10 (×5): qty 4

## 2020-07-10 MED ORDER — LEVOTHYROXINE SODIUM 100 MCG PO TABS
100.0000 ug | ORAL_TABLET | ORAL | Status: DC
  Administered 2020-07-14: 100 ug via ORAL
  Filled 2020-07-10: qty 1

## 2020-07-10 MED ORDER — CHLORHEXIDINE GLUCONATE 0.12 % MT SOLN
15.0000 mL | Freq: Once | OROMUCOSAL | Status: AC
  Administered 2020-07-10: 15 mL via OROMUCOSAL

## 2020-07-10 MED ORDER — ONDANSETRON HCL 4 MG/2ML IJ SOLN
INTRAMUSCULAR | Status: AC
  Filled 2020-07-10: qty 2

## 2020-07-10 MED ORDER — ONDANSETRON HCL 4 MG/2ML IJ SOLN: 4.0000 mg | Freq: Four times a day (QID) | INTRAMUSCULAR | Status: DC | PRN

## 2020-07-10 MED ORDER — RISAQUAD PO CAPS
1.0000 | ORAL_CAPSULE | Freq: Every day | ORAL | Status: DC
  Administered 2020-07-11 – 2020-07-15 (×5): 1 via ORAL
  Filled 2020-07-10 (×5): qty 1

## 2020-07-10 MED ORDER — ONDANSETRON HCL 4 MG/2ML IJ SOLN
INTRAMUSCULAR | Status: DC | PRN
  Administered 2020-07-10: 4 mg via INTRAVENOUS

## 2020-07-10 MED ORDER — ASPIRIN 81 MG PO CHEW
81.0000 mg | CHEWABLE_TABLET | Freq: Two times a day (BID) | ORAL | Status: DC
  Administered 2020-07-10 – 2020-07-15 (×10): 81 mg via ORAL
  Filled 2020-07-10 (×10): qty 1

## 2020-07-10 MED ORDER — PROPOFOL 10 MG/ML IV BOLUS
INTRAVENOUS | Status: DC | PRN
  Administered 2020-07-10: 20 mg via INTRAVENOUS

## 2020-07-10 MED ORDER — MIDAZOLAM HCL 5 MG/5ML IJ SOLN
INTRAMUSCULAR | Status: DC | PRN
  Administered 2020-07-10 (×2): 1 mg via INTRAVENOUS

## 2020-07-10 MED ORDER — CLONAZEPAM 0.5 MG PO TABS
0.5000 mg | ORAL_TABLET | Freq: Two times a day (BID) | ORAL | Status: DC
  Administered 2020-07-10 – 2020-07-15 (×10): 0.5 mg via ORAL
  Filled 2020-07-10 (×11): qty 1

## 2020-07-10 MED ORDER — KCL IN DEXTROSE-NACL 20-5-0.45 MEQ/L-%-% IV SOLN
INTRAVENOUS | Status: AC
  Filled 2020-07-10 (×2): qty 1000

## 2020-07-10 MED ORDER — PHENYLEPHRINE HCL-NACL 20-0.9 MG/250ML-% IV SOLN
INTRAVENOUS | Status: DC | PRN
  Administered 2020-07-10: 25 ug/min via INTRAVENOUS

## 2020-07-10 MED ORDER — ORAL CARE MOUTH RINSE: 15.0000 mL | Freq: Once | OROMUCOSAL | Status: AC

## 2020-07-10 MED ORDER — LAMOTRIGINE 100 MG PO TABS
300.0000 mg | ORAL_TABLET | Freq: Every day | ORAL | Status: DC
  Administered 2020-07-10 – 2020-07-14 (×5): 300 mg via ORAL
  Filled 2020-07-10 (×5): qty 3

## 2020-07-10 MED ORDER — ZIPRASIDONE HCL 40 MG PO CAPS
40.0000 mg | ORAL_CAPSULE | Freq: Two times a day (BID) | ORAL | Status: DC
  Administered 2020-07-10 – 2020-07-15 (×10): 40 mg via ORAL
  Filled 2020-07-10 (×11): qty 1

## 2020-07-10 MED ORDER — BISACODYL 5 MG PO TBEC
5.0000 mg | DELAYED_RELEASE_TABLET | Freq: Every day | ORAL | Status: DC | PRN
  Administered 2020-07-14: 5 mg via ORAL
  Filled 2020-07-10: qty 1

## 2020-07-10 MED ORDER — ZIPRASIDONE HCL 80 MG PO CAPS
160.0000 mg | ORAL_CAPSULE | Freq: Every day | ORAL | Status: DC
  Administered 2020-07-10 – 2020-07-14 (×5): 160 mg via ORAL
  Filled 2020-07-10 (×5): qty 2

## 2020-07-10 MED ORDER — BUPIVACAINE LIPOSOME 1.3 % IJ SUSP
20.0000 mL | Freq: Once | INTRAMUSCULAR | Status: DC
  Filled 2020-07-10: qty 20

## 2020-07-10 MED ORDER — FENTANYL CITRATE (PF) 100 MCG/2ML IJ SOLN
INTRAMUSCULAR | Status: DC | PRN
  Administered 2020-07-10: 25 ug via INTRAVENOUS
  Administered 2020-07-10: 50 ug via INTRAVENOUS

## 2020-07-10 MED ORDER — PROPOFOL 1000 MG/100ML IV EMUL
INTRAVENOUS | Status: AC
  Filled 2020-07-10: qty 100

## 2020-07-10 MED ORDER — PHENOL 1.4 % MT LIQD: 1.0000 | OROMUCOSAL | Status: DC | PRN

## 2020-07-10 MED ORDER — HYDROCODONE-ACETAMINOPHEN 5-325 MG PO TABS
1.0000 | ORAL_TABLET | ORAL | Status: DC | PRN
  Administered 2020-07-10: 2 via ORAL
  Administered 2020-07-11 (×2): 1 via ORAL
  Administered 2020-07-11: 2 via ORAL
  Administered 2020-07-11 – 2020-07-13 (×3): 1 via ORAL
  Administered 2020-07-15 (×2): 2 via ORAL
  Filled 2020-07-10 (×2): qty 1
  Filled 2020-07-10: qty 2
  Filled 2020-07-10: qty 1
  Filled 2020-07-10 (×3): qty 2
  Filled 2020-07-10 (×2): qty 1

## 2020-07-10 MED ORDER — TRANEXAMIC ACID-NACL 1000-0.7 MG/100ML-% IV SOLN
1000.0000 mg | INTRAVENOUS | Status: AC
  Administered 2020-07-10: 1000 mg via INTRAVENOUS
  Filled 2020-07-10: qty 100

## 2020-07-10 MED ORDER — EPHEDRINE SULFATE-NACL 50-0.9 MG/10ML-% IV SOSY
PREFILLED_SYRINGE | INTRAVENOUS | Status: DC | PRN
  Administered 2020-07-10: 10 mg via INTRAVENOUS

## 2020-07-10 MED ORDER — BUPIVACAINE IN DEXTROSE 0.75-8.25 % IT SOLN
INTRATHECAL | Status: DC | PRN
  Administered 2020-07-10: 1.6 mL via INTRATHECAL

## 2020-07-10 MED ORDER — PHENYLEPHRINE HCL (PRESSORS) 10 MG/ML IV SOLN
INTRAVENOUS | Status: AC
  Filled 2020-07-10: qty 2

## 2020-07-10 MED ORDER — POLYETHYLENE GLYCOL 3350 17 G PO PACK
17.0000 g | PACK | Freq: Every day | ORAL | 0 refills | Status: AC
  Filled 2020-07-10: qty 14, 14d supply, fill #0

## 2020-07-10 MED ORDER — MIDAZOLAM HCL 2 MG/2ML IJ SOLN
INTRAMUSCULAR | Status: AC
  Filled 2020-07-10: qty 2

## 2020-07-10 SURGICAL SUPPLY — 85 items
AGENT HMST SPONGE THK3/8 (HEMOSTASIS)
ATTUNE MED DOME PAT 32 KNEE (Knees) ×1 IMPLANT
ATTUNE PS FEM RT SZ 4 CEM KNEE (Femur) ×1 IMPLANT
ATTUNE PSRP INSR SZ4 5 KNEE (Insert) ×1 IMPLANT
BAG COUNTER SPONGE SURGICOUNT (BAG) IMPLANT
BAG DECANTER FOR FLEXI CONT (MISCELLANEOUS) ×2 IMPLANT
BAG SPEC THK2 15X12 ZIP CLS (MISCELLANEOUS)
BAG SPNG CNTER NS LX DISP (BAG)
BAG ZIPLOCK 12X15 (MISCELLANEOUS) IMPLANT
BASEPLATE TIBIAL ROTATING SZ 4 (Knees) ×1 IMPLANT
BLADE SAW SGTL 11.0X1.19X90.0M (BLADE) IMPLANT
BLADE SAW SGTL 13.0X1.19X90.0M (BLADE) ×2 IMPLANT
BLADE SURG SZ10 CARB STEEL (BLADE) ×4 IMPLANT
BNDG CMPR MED 10X6 ELC LF (GAUZE/BANDAGES/DRESSINGS) ×1
BNDG COHESIVE 4X5 TAN STRL (GAUZE/BANDAGES/DRESSINGS) ×2 IMPLANT
BNDG ELASTIC 4X5.8 VLCR STR LF (GAUZE/BANDAGES/DRESSINGS) ×2 IMPLANT
BNDG ELASTIC 6X10 VLCR STRL LF (GAUZE/BANDAGES/DRESSINGS) ×1 IMPLANT
BNDG ELASTIC 6X5.8 VLCR STR LF (GAUZE/BANDAGES/DRESSINGS) ×2 IMPLANT
BSPLAT TIB 4 CMNT ROT PLAT STR (Knees) ×1 IMPLANT
CEMENT HV SMART SET (Cement) ×4 IMPLANT
COVER SURGICAL LIGHT HANDLE (MISCELLANEOUS) ×2 IMPLANT
CUFF TOURN SGL QUICK 34 (TOURNIQUET CUFF) ×2
CUFF TRNQT CYL 34X4.125X (TOURNIQUET CUFF) ×1 IMPLANT
DECANTER SPIKE VIAL GLASS SM (MISCELLANEOUS) ×2 IMPLANT
DRAPE INCISE IOBAN 66X45 STRL (DRAPES) IMPLANT
DRAPE ORTHO SPLIT 77X108 STRL (DRAPES) ×4
DRAPE SHEET LG 3/4 BI-LAMINATE (DRAPES) ×4 IMPLANT
DRAPE SURG ORHT 6 SPLT 77X108 (DRAPES) ×2 IMPLANT
DRAPE U-SHAPE 47X51 STRL (DRAPES) ×2 IMPLANT
DRESSING AQUACEL AG SP 3.5X10 (GAUZE/BANDAGES/DRESSINGS) IMPLANT
DRSG AQUACEL AG ADV 3.5X10 (GAUZE/BANDAGES/DRESSINGS) ×2 IMPLANT
DRSG AQUACEL AG SP 3.5X10 (GAUZE/BANDAGES/DRESSINGS) ×2
DRSG TEGADERM 4X4.75 (GAUZE/BANDAGES/DRESSINGS) IMPLANT
DURAPREP 26ML APPLICATOR (WOUND CARE) ×2 IMPLANT
ELECT BLADE TIP CTD 4 INCH (ELECTRODE) ×2 IMPLANT
ELECT REM PT RETURN 15FT ADLT (MISCELLANEOUS) ×2 IMPLANT
EVACUATOR 1/8 PVC DRAIN (DRAIN) IMPLANT
GAUZE SPONGE 2X2 8PLY STRL LF (GAUZE/BANDAGES/DRESSINGS) IMPLANT
GLOVE SRG 8 PF TXTR STRL LF DI (GLOVE) ×1 IMPLANT
GLOVE SURG POLYISO LF SZ7.5 (GLOVE) ×4 IMPLANT
GLOVE SURG POLYISO LF SZ8 (GLOVE) ×4 IMPLANT
GLOVE SURG UNDER POLY LF SZ7.5 (GLOVE) ×2 IMPLANT
GLOVE SURG UNDER POLY LF SZ8 (GLOVE) ×2
GOWN STRL REUS W/TWL XL LVL3 (GOWN DISPOSABLE) ×4 IMPLANT
HANDPIECE INTERPULSE COAX TIP (DISPOSABLE) ×2
HEMOSTAT SPONGE AVITENE ULTRA (HEMOSTASIS) IMPLANT
HOLDER FOLEY CATH W/STRAP (MISCELLANEOUS) IMPLANT
IMMOBILIZER KNEE 20 (SOFTGOODS) ×2
IMMOBILIZER KNEE 20 THIGH 36 (SOFTGOODS) ×1 IMPLANT
JET LAVAGE IRRISEPT WOUND (IRRIGATION / IRRIGATOR) ×2
KIT TURNOVER KIT A (KITS) ×2 IMPLANT
LAVAGE JET IRRISEPT WOUND (IRRIGATION / IRRIGATOR) ×1 IMPLANT
MANIFOLD NEPTUNE II (INSTRUMENTS) ×2 IMPLANT
NDL SAFETY ECLIPSE 18X1.5 (NEEDLE) IMPLANT
NEEDLE HYPO 18GX1.5 SHARP (NEEDLE)
NS IRRIG 1000ML POUR BTL (IV SOLUTION) IMPLANT
PACK TOTAL KNEE CUSTOM (KITS) ×2 IMPLANT
PIN DRILL FIX HALF THREAD (BIT) ×1 IMPLANT
PIN STEINMAN FIXATION KNEE (PIN) ×1 IMPLANT
PROTECTOR NERVE ULNAR (MISCELLANEOUS) ×2 IMPLANT
SAW OSC TIP CART 19.5X105X1.3 (SAW) ×2 IMPLANT
SEALER BIPOLAR AQUA 6.0 (INSTRUMENTS) ×2 IMPLANT
SET HNDPC FAN SPRY TIP SCT (DISPOSABLE) ×1 IMPLANT
SPONGE GAUZE 2X2 STER 10/PKG (GAUZE/BANDAGES/DRESSINGS)
SPONGE SURGIFOAM ABS GEL 100 (HEMOSTASIS) IMPLANT
STAPLER VISISTAT (STAPLE) IMPLANT
STRIP CLOSURE SKIN 1/2X4 (GAUZE/BANDAGES/DRESSINGS) IMPLANT
SUT BONE WAX W31G (SUTURE) ×2 IMPLANT
SUT MNCRL AB 4-0 PS2 18 (SUTURE) IMPLANT
SUT STRATAFIX 0 PDS 27 VIOLET (SUTURE) ×2
SUT VIC AB 1 CT1 27 (SUTURE) ×4
SUT VIC AB 1 CT1 27XBRD ANTBC (SUTURE) ×2 IMPLANT
SUT VIC AB 1 CTX 36 (SUTURE)
SUT VIC AB 1 CTX36XBRD ANBCTR (SUTURE) IMPLANT
SUT VIC AB 2-0 CT1 27 (SUTURE) ×6
SUT VIC AB 2-0 CT1 TAPERPNT 27 (SUTURE) ×3 IMPLANT
SUTURE STRATFX 0 PDS 27 VIOLET (SUTURE) ×1 IMPLANT
SYR 3ML LL SCALE MARK (SYRINGE) IMPLANT
SYR 50ML LL SCALE MARK (SYRINGE) IMPLANT
TAPE STRIPS DRAPE STRL (GAUZE/BANDAGES/DRESSINGS) ×1 IMPLANT
TOWER CARTRIDGE SMART MIX (DISPOSABLE) ×2 IMPLANT
TRAY FOLEY MTR SLVR 16FR STAT (SET/KITS/TRAYS/PACK) ×2 IMPLANT
WATER STERILE IRR 1000ML POUR (IV SOLUTION) ×2 IMPLANT
WIPE CHG CHLORHEXIDINE 2% (PERSONAL CARE ITEMS) ×2 IMPLANT
WRAP KNEE MAXI GEL POST OP (GAUZE/BANDAGES/DRESSINGS) ×2 IMPLANT

## 2020-07-10 NOTE — Interval H&P Note (Signed)
History and Physical Interval Note:  07/10/2020 7:11 AM  Shelley Clarke  has presented today for surgery, with the diagnosis of Right knee degenerative joint disease.  The various methods of treatment have been discussed with the patient and family. After consideration of risks, benefits and other options for treatment, the patient has consented to  Procedure(s): TOTAL KNEE ARTHROPLASTY (Right) as a surgical intervention.  The patient's history has been reviewed, patient examined, no change in status, stable for surgery.  I have reviewed the patient's chart and labs.  Questions were answered to the patient's satisfaction.     Javier Docker

## 2020-07-10 NOTE — Transfer of Care (Signed)
Immediate Anesthesia Transfer of Care Note  Patient: Shelley Clarke  Procedure(s) Performed: TOTAL KNEE ARTHROPLASTY (Right: Knee)  Patient Location: PACU  Anesthesia Type:MAC and Spinal  Level of Consciousness: awake, alert , oriented and patient cooperative  Airway & Oxygen Therapy: Patient Spontanous Breathing and Patient connected to face mask oxygen  Post-op Assessment: Report given to RN and Post -op Vital signs reviewed and stable  Post vital signs: Reviewed and stable  Last Vitals:  Vitals Value Taken Time  BP    Temp    Pulse    Resp    SpO2      Last Pain:  Vitals:   07/10/20 0618  TempSrc:   PainSc: 0-No pain         Complications: No notable events documented.

## 2020-07-10 NOTE — Brief Op Note (Signed)
07/10/2020  9:38 AM  PATIENT:  Shelley Clarke  70 y.o. female  PRE-OPERATIVE DIAGNOSIS:  Right knee degenerative joint disease  POST-OPERATIVE DIAGNOSIS:  Right knee degenerative joint disease  PROCEDURE:  Procedure(s): TOTAL KNEE ARTHROPLASTY (Right)  SURGEON:  Surgeon(s) and Role:    Jene Every, MD - Primary  PHYSICIAN ASSISTANT:   ASSISTANTS: Bissell   ANESTHESIA:   spinal  EBL:  50 mL   BLOOD ADMINISTERED:none  DRAINS: none   LOCAL MEDICATIONS USED:  MARCAINE     SPECIMEN:  No Specimen  DISPOSITION OF SPECIMEN:  N/A  COUNTS:  YES  TOURNIQUET:   Total Tourniquet Time Documented: Thigh (Right) - 68 minutes Total: Thigh (Right) - 68 minutes   DICTATION: .Other Dictation: Dictation Number [id number]  PLAN OF CARE: Admit for overnight observation  PATIENT DISPOSITION:  PACU - hemodynamically stable.   Delay start of Pharmacological VTE agent (>24hrs) due to surgical blood loss or risk of bleeding: no

## 2020-07-10 NOTE — Plan of Care (Signed)
  Problem: Activity: Goal: Risk for activity intolerance will decrease Outcome: Progressing   Problem: Pain Managment: Goal: General experience of comfort will improve Outcome: Progressing   

## 2020-07-10 NOTE — Anesthesia Procedure Notes (Addendum)
Anesthesia Regional Block: Adductor canal block   Pre-Anesthetic Checklist: , timeout performed,  Correct Patient, Correct Site, Correct Laterality,  Correct Procedure, Correct Position, site marked,  Risks and benefits discussed,  Surgical consent,  Pre-op evaluation,  At surgeon's request and post-op pain management  Laterality: Lower and Right  Prep: chloraprep       Needles:  Injection technique: Single-shot  Needle Type: Stimiplex     Needle Length: 9cm  Needle Gauge: 21     Additional Needles:   Procedures:,,,, ultrasound used (permanent image in chart),,    Narrative:  Start time: 07/10/2020 6:55 AM End time: 07/10/2020 7:15 AM Injection made incrementally with aspirations every 5 mL.  Performed by: Personally  Anesthesiologist: Lewie Loron, MD  Additional Notes: BP cuff, EKG monitors applied. Sedation begun. Artery and nerve location verified with ultrasound. Anesthetic injected incrementally (65ml), slowly, and after negative aspirations under direct u/s guidance. Good fascial/perineural spread. Tolerated well.

## 2020-07-10 NOTE — Anesthesia Procedure Notes (Addendum)
Spinal  Patient location during procedure: OR Start time: 07/10/2020 7:25 AM End time: 07/10/2020 7:33 AM Reason for block: surgical anesthesia Staffing Performed: anesthesiologist  Anesthesiologist: Nolon Nations, MD Preanesthetic Checklist Completed: patient identified, IV checked, site marked, risks and benefits discussed, surgical consent, monitors and equipment checked, pre-op evaluation and timeout performed Spinal Block Patient position: sitting Prep: DuraPrep and site prepped and draped Patient monitoring: heart rate, continuous pulse ox and blood pressure Approach: right paramedian Location: L3-4 Injection technique: single-shot Needle Needle type: Sprotte  Needle gauge: 24 G Needle length: 9 cm Assessment Events: second provider Additional Notes Attempt x 1 at ~L2/3 by CRNA and x 1 at same location by Cloyce Blankenhorn. Down to L3/4 x 1 by Janiyha Montufar. Expiration date of kit checked and confirmed. Patient tolerated procedure well, without complications.

## 2020-07-10 NOTE — Progress Notes (Signed)
Patient admitted room, alert and oriented. No pain or discomfort. Oriented to room and staffs. No problem at this time.

## 2020-07-10 NOTE — Evaluation (Signed)
Physical Therapy Evaluation Patient Details Name: Shelley Clarke MRN: [id number] DOB: 03/10/1950 Today's Date: 07/10/2020   History of Present Illness  Patient is 70 y.o. female s/p Rt TKA on 07/10/20 with PMH significant for bipolar disorder, Tardive dyskinesia, hypothyroidism, CKDIII, anxiety & depression, chest pains. Patient reports ongoing deficits from admissionin October 2019 for acute hypoxic respiratory failure secondary to CAP which left her with difficulty mobilizing and performing ADL's.    Clinical Impression  Shelley Clarke is a 70 y.o. female POD 0 s/p Rt TKA. Patient reports difficulty with mobility at baseline secondary to balance impairments related to tardive dyskinesia. She requires min guard/assistance from her husband intermittently for transfer due to balance impairments and was progressing well with OPPT until discontinuing therapy ~2 months ago to conserve visits for her TKA recovery. Patient is now limited by functional impairments (see PT problem list below) and requires Mod +2 assist for transfers with RW. Patient instructed in exercise to facilitate circulation to manage edema and reduce risk of DVT. Patient will benefit from continued skilled PT interventions to address impairments and progress towards PLOF. Acute PT will follow to progress mobility during acute stay. Patient has excellent support at home and would greatly benefit from intense therapy follow up at CIR to reduce risk of fall and reduce caregiver burden due to neuromuscular disorder affecting balance.    Follow Up Recommendations CIR    Equipment Recommendations  Rolling walker with 5" wheels;3in1 (PT)    Recommendations for Other Services OT consult;Rehab consult     Precautions / Restrictions Precautions Precautions: Fall Precaution Comments: pt with history for ~5 falls in last 6 months (tardive dyskinesia) Restrictions Weight Bearing Restrictions: No Other Position/Activity Restrictions: WBAT       Mobility  Bed Mobility Overal bed mobility: Needs Assistance Bed Mobility: Supine to Sit     Supine to sit: Min assist;HOB elevated     General bed mobility comments: cues for sequencing use of bed rail and min assist to bring bil LE's off EOB and raise trunk upright.    Transfers Overall transfer level: Needs assistance Equipment used: Rolling walker (2 wheeled) Transfers: Sit to/from [employer] Sit to Stand: From elevated surface;Mod assist Stand pivot transfers: Mod assist;+2 physical assistance;+2 safety/equipment;From elevated surface       General transfer comment: cues for safe hand placement for power up, mod assist to initiate and steady with rise. pt with significant posterior lean in standing and manual facilitation required to shift anteriorly for upright posture. Pt with decresed UE strength and Rt knee buckling even with use of immobilizer. 2+ Mod assist for stand step/pivot transfer bed>chair with assist for RW position and manual blocking of Rt knee in brace to prevent buckling.  Ambulation/Gait                Stairs            Wheelchair Mobility    Modified Rankin (Stroke Patients Only)       Balance Overall balance assessment: Needs assistance;History of Falls Sitting-balance support: Feet supported;Bilateral upper extremity supported Sitting balance-Leahy Scale: Fair     Standing balance support: Bilateral upper extremity supported;During functional activity Standing balance-Leahy Scale: Poor Standing balance comment: heavy reliance on external support with RW.                             Pertinent Vitals/Pain Pain Assessment: Faces Faces Pain Scale: Hurts little  more Pain Location: Rt knee Pain Descriptors / Indicators: Aching;Discomfort Pain Intervention(s): Limited activity within patient's tolerance;Monitored during session;Premedicated before session;Repositioned    Home Living  Family/patient expects to be discharged to:: Private residence Living Arrangements: Spouse/significant other Available Help at Discharge: Family Type of Home: House Home Access: Stairs to enter Entrance Stairs-Rails: Left Entrance Stairs-Number of Steps: 1 Home Layout: One level Home Equipment: Walker - 4 wheels;Shower seat;Grab bars - tub/shower (platform walker)      Prior Function Level of Independence: Needs assistance   Gait / Transfers Assistance Needed: pt often holds onto her husband to get up/down and ambulate short distances in home.  ADL's / Homemaking Assistance Needed: Pt's husband does homemaking  Comments: pt was going to OPPT for balance training but stopped going due to visit limit/trying to retain enough visits to recover from TKA.     Hand Dominance   Dominant Hand: Right    Extremity/Trunk Assessment   Upper Extremity Assessment Upper Extremity Assessment: Generalized weakness    Lower Extremity Assessment Lower Extremity Assessment: Generalized weakness;RLE deficits/detail RLE Deficits / Details: limited quad acitvation, knee immobilizer required for mobility. RLE: Unable to fully assess due to immobilization RLE Sensation: WNL RLE Coordination: decreased gross motor    Cervical / Trunk Assessment Cervical / Trunk Assessment: Kyphotic  Communication   Communication: No difficulties  Cognition Arousal/Alertness: Awake/alert Behavior During Therapy: Flat affect Overall Cognitive Status: Within Functional Limits for tasks assessed                                 General Comments: pt reports a fear that she is developing Alzheimers 2/2 her mood medications that she has been on for ~40 years.      General Comments      Exercises Total Joint Exercises Ankle Circles/Pumps: AROM;Both;10 reps;Seated   Assessment/Plan    PT Assessment Patient needs continued PT services  PT Problem List Decreased strength;Decreased range of  motion;Decreased activity tolerance;Decreased balance;Decreased mobility;Decreased cognition;Decreased knowledge of use of DME;Decreased safety awareness;Decreased knowledge of precautions;Pain       PT Treatment Interventions DME instruction;Gait training;Stair training;Functional mobility training;Therapeutic activities;Therapeutic exercise;Balance training;Neuromuscular re-education;Patient/family education    PT Goals (Current goals can be found in the Care Plan section)  Acute Rehab PT Goals Patient Stated Goal: to improve Rt knee strength and improve balance to stop falling PT Goal Formulation: With patient/family Time For Goal Achievement: 07/24/20 Potential to Achieve Goals: Good    Frequency 7X/week   Barriers to discharge        Co-evaluation               AM-PAC PT "6 Clicks" Mobility  Outcome Measure Help needed turning from your back to your side while in a flat bed without using bedrails?: A Little Help needed moving from lying on your back to sitting on the side of a flat bed without using bedrails?: A Little Help needed moving to and from a bed to a chair (including a wheelchair)?: A Lot Help needed standing up from a chair using your arms (e.g., wheelchair or bedside chair)?: A Lot Help needed to walk in hospital room?: Total Help needed climbing 3-5 steps with a railing? : Total 6 Click Score: 12    End of Session Equipment Utilized During Treatment: Gait belt;Right knee immobilizer Activity Tolerance: Patient tolerated treatment well Patient left: in chair;with call bell/phone within reach;with chair alarm set;with family/visitor present  Nurse Communication: Mobility status (2 person Mod assist back to  bed with knee immobilizer) PT Visit Diagnosis: Muscle weakness (generalized) (M62.81);Unsteadiness on feet (R26.81);Other abnormalities of gait and mobility (R26.89);Other symptoms and signs involving the nervous system (R29.898);Difficulty in walking, not  elsewhere classified (R26.2)    Time: 1445-1530 PT Time Calculation (min) (ACUTE ONLY): 45 min   Charges:   PT Evaluation $PT Eval Low Complexity: 1 Low PT Treatments $Therapeutic Activity: 23-37 mins        Wynn Maudlin, DPT Acute Rehabilitation Services Office [phone] Pager [phone]   Anitra Lauth 07/10/2020, 5:01 PM

## 2020-07-10 NOTE — Discharge Instructions (Signed)

## 2020-07-10 NOTE — Progress Notes (Signed)
Rehab Admissions Coordinator Note:  Patient was screened by Clois Dupes for appropriateness for an Inpatient Acute Rehab Consult per therapy recs. Patient lacks the medical neccesity for a CIR admit for elective TKR. Recommend pursuing other rehab venue options.Clois Dupes RN MSN 07/10/2020, 7:01 PM  I can be reached at [phone].

## 2020-07-11 ENCOUNTER — Other Ambulatory Visit (HOSPITAL_COMMUNITY): Payer: Self-pay

## 2020-07-11 ENCOUNTER — Encounter (HOSPITAL_COMMUNITY): Payer: Self-pay | Admitting: Specialist

## 2020-07-11 LAB — BASIC METABOLIC PANEL
Anion gap: 4 — ABNORMAL LOW (ref 5–15)
BUN: 14 mg/dL (ref 8–23)
CO2: 30 mmol/L (ref 22–32)
Calcium: 9.1 mg/dL (ref 8.9–10.3)
Chloride: 106 mmol/L (ref 98–111)
Creatinine, Ser: 0.98 mg/dL (ref 0.44–1.00)
GFR, Estimated: >60 mL/min (ref >60)
Glucose, Bld: 156 mg/dL — ABNORMAL HIGH (ref 70–99)
Potassium: 4.2 mmol/L (ref 3.5–5.1)
Sodium: 140 mmol/L (ref 135–145)

## 2020-07-11 LAB — CBC
HCT: 34.5 % — ABNORMAL LOW (ref 36.0–46.0)
Hemoglobin: 11.5 g/dL — ABNORMAL LOW (ref 12.0–15.0)
MCH: 33.4 pg (ref 26.0–34.0)
MCHC: 33.3 g/dL (ref 30.0–36.0)
MCV: 100.3 fL — ABNORMAL HIGH (ref 80.0–100.0)
Platelets: 168 10*3/uL (ref 150–400)
RBC: 3.44 MIL/uL — ABNORMAL LOW (ref 3.87–5.11)
RDW: 13.1 % (ref 11.5–15.5)
WBC: 9.6 10*3/uL (ref 4.0–10.5)
nRBC: 0 % (ref 0.0–0.2)

## 2020-07-11 MED ORDER — SODIUM CHLORIDE 0.9 % IV BOLUS
500.0000 mL | Freq: Once | INTRAVENOUS | Status: AC
  Administered 2020-07-11: 500 mL via INTRAVENOUS

## 2020-07-11 MED ORDER — KETOROLAC TROMETHAMINE 15 MG/ML IJ SOLN
7.5000 mg | Freq: Two times a day (BID) | INTRAMUSCULAR | Status: DC | PRN
  Administered 2020-07-11 – 2020-07-13 (×4): 7.5 mg via INTRAVENOUS
  Filled 2020-07-11 (×4): qty 1

## 2020-07-11 NOTE — Progress Notes (Signed)
Patient ID: Shelley Clarke, female   DOB: [birth year], 70 y.o.   MRN: [id number] Subjective: 1 Day Post-Op Procedure(s) (LRB): TOTAL KNEE ARTHROPLASTY (Right) Patient reports pain as severe.    Patient has complaints of knee pain. Dizziness.  We will start therapy today. Plan is to go home vs rehab after hospital stay.  Objective: Vital signs in last 24 hours: Temp:  [97.4 F (36.3 C)-98.2 F (36.8 C)] 97.8 F (36.6 C) (06/30 1307) Pulse Rate:  [49-75] 50 (06/30 1307) Resp:  [14-17] 16 (06/30 1307) BP: (62-137)/(34-75) 99/57 (06/30 1307) SpO2:  [91 %-100 %] 99 % (06/30 1307)  Intake/Output from previous day:  Intake/Output Summary (Last 24 hours) at 07/11/2020 1347 Last data filed at 07/11/2020 0900 Gross per 24 hour  Intake 1019.63 ml  Output 2800 ml  Net -1780.37 ml    Intake/Output this shift: Total I/O In: 120 [P.O.:120] Out: -   Labs: Results for orders placed or performed during the hospital encounter of 07/10/20  CBC  Result Value Ref Range   WBC 9.6 4.0 - 10.5 K/uL   RBC 3.44 (L) 3.87 - 5.11 MIL/uL   Hemoglobin 11.5 (L) 12.0 - 15.0 g/dL   HCT 40.9 (L) 73.5 - 32.9 %   MCV 100.3 (H) 80.0 - 100.0 fL   MCH 33.4 26.0 - 34.0 pg   MCHC 33.3 30.0 - 36.0 g/dL   RDW 92.4 26.8 - 34.1 %   Platelets 168 150 - 400 K/uL   nRBC 0.0 0.0 - 0.2 %  Basic metabolic panel  Result Value Ref Range   Sodium 140 135 - 145 mmol/L   Potassium 4.2 3.5 - 5.1 mmol/L   Chloride 106 98 - 111 mmol/L   CO2 30 22 - 32 mmol/L   Glucose, Bld 156 (H) 70 - 99 mg/dL   BUN 14 8 - 23 mg/dL   Creatinine, Ser 9.62 0.44 - 1.00 mg/dL   Calcium 9.1 8.9 - 22.9 mg/dL   GFR, Estimated >79 >89 mL/min   Anion gap 4 (L) 5 - 15    Exam - Neurologically intact ABD soft Neurovascular intact Sensation intact distally Intact pulses distally Dorsiflexion/Plantar flexion intact Incision: dressing C/D/I and no drainage No cellulitis present Compartment soft No sign of DVT Dressing - clean, dry, no  drainage Motor function intact - moving foot and toes well on exam.  Assessment/Plan: 1 Day Post-Op Procedure(s) (LRB): TOTAL KNEE ARTHROPLASTY (Right)  Advance diet Up with therapy D/C IV fluids Past Medical History:  Diagnosis Date   Atypical chest pain 11/15/2017   Bipolar disorder (HCC)    Depression    Exercise-induced asthma    Family history of adverse reaction to anesthesia    Hypothyroidism    Pneumonia    Anticipated LOS equal to or greater than 2 midnights due to - Age 9 and older with one or more of the following:  - Obesity  - Expected need for hospital services (PT, OT, Nursing) required for safe  discharge  - Anticipated need for postoperative skilled nursing care or inpatient rehab  - Active co-morbidities: None OR   - Unanticipated findings during/Post Surgery: Slow post-op progression: GI, pain control, mobility  - Patient is a high risk of re-admission due to: None  DVT Prophylaxis - ASA Protocol Weight-Bearing as tolerated to right leg No vaccines. Depending on progress could consider home with HHPT when ready vs. Rehab. Add toradol  Dorothy Spark 07/11/2020, 1:47 PM

## 2020-07-11 NOTE — TOC Initial Note (Signed)
Transition of Care [facility]) - Initial/Assessment Note   Patient Details  Name: Shelley Clarke MRN: [id number] Date of Birth: 1950-08-11  [facility]) CM/SW Contact:    Sherie Don, LCSW Phone Number: 07/11/2020, 2:15 PM  Clinical Narrative: PT evaluation recommended SNF. CSW met with patient and her husband to discuss recommendations. Patient is agreeable to SNF, but does not want to return to [facility]. Patient has been vaccinated for COVID, but not boosted. Patient is aware this will likely mean she needs to quarantine.  FL2 done; PASRR pending as requested clinicals have been uploaded to [place] MUST. Initial referral faxed out in hub. TOC awaiting bed offers and PASRR number.  Expected Discharge Plan: Skilled Nursing Facility Barriers to Discharge: Continued Medical Work up, SNF Pending bed offer, Awaiting State Approval (PASRR)  Patient Goals and CMS Choice Patient states their goals for this hospitalization and ongoing recovery are:: Go to rehab CMS Medicare.gov Compare Post Acute Care list provided to:: Patient Choice offered to / list presented to : Patient  Expected Discharge Plan and Services Expected Discharge Plan: [place] In-house Referral: Clinical Social Work Post Acute Care Choice: [place] Living arrangements for the past 2 months: [place]              DME Arranged: N/A DME Agency: NA  Prior Living Arrangements/Services Living arrangements for the past 2 months: Single Family Home Lives with:: Spouse Patient language and need for interpreter reviewed:: Yes Do you feel safe going back to the place where you live?: Yes      Need for Family Participation in Patient Care: No (Comment) Care giver support system in place?: Yes (comment) Criminal Activity/Legal Involvement Pertinent to Current Situation/Hospitalization: No - Comment as needed  Activities of Daily Living Home Assistive Devices/Equipment:  Eyeglasses ADL Screening (condition at time of admission) Patient's cognitive ability adequate to safely complete daily activities?: Yes Is the patient deaf or have difficulty hearing?: No Does the patient have difficulty seeing, even when wearing glasses/contacts?: No Does the patient have difficulty concentrating, remembering, or making decisions?: No Patient able to express need for assistance with ADLs?: Yes Does the patient have difficulty dressing or bathing?: No Independently performs ADLs?: Yes (appropriate for developmental age) Does the patient have difficulty walking or climbing stairs?: Yes Weakness of Legs: Both Weakness of Arms/Hands: None  Permission Sought/Granted Permission sought to share information with : Facility Art therapist granted to share information with : Yes, Verbal Permission Granted Permission granted to share info w AGENCY: SNFs  Emotional Assessment Appearance:: Appears stated age Attitude/Demeanor/Rapport: Engaged Affect (typically observed): Appropriate Orientation: : Oriented to Self, Oriented to Place, Oriented to  Time, Oriented to Situation Alcohol / Substance Use: Not Applicable  Admission diagnosis:  S/P TKR (total knee replacement) using cement [Z96.659] Patient Active Problem List   Diagnosis Date Noted   S/P TKR (total knee replacement) using cement 07/10/2020   Tardive dyskinesia 12/24/2019   Memory difficulties 12/24/2019   OCD (obsessive compulsive disorder) 12/28/2017   Generalized anxiety disorder 12/28/2017   Moderate depressed bipolar I disorder ([place]) 12/28/2017   Atypical chest pain 11/15/2017   CAP (community acquired pneumonia) 11/02/2017   Influenza A 11/02/2017   Acute respiratory failure with hypoxia ([place]) 11/02/2017   CKD (chronic kidney disease) stage 3, GFR 30-59 ml/min ([place]) 11/02/2017   Bipolar disorder ([place]) 11/02/2017   Anxiety 04/15/2015   Depression 04/15/2015   Hypothyroidism 04/15/2015    PCP:  Deatra Ina,  Baldemar Friday., PA-C Pharmacy:   [id number] [place] [place] - [address] [place] [address]Phone: [phone]: [phone] [id number] [place] [address]one: [phone]: [phone]hone: [phone]: [phone]hone: [phone]: [phone] Risk Interventions No flowsheet data found.

## 2020-07-11 NOTE — Progress Notes (Signed)
Patient ID: Shelley Clarke, female   DOB: Sep 21, 1950, 70 y.o.   MRN: [id number]  RE: Shelley Clarke DOB: October 30, 1950 Date: 07/11/2020 MUST ID: [id number] To Whom It May Concern: Please be advised that the above name patient will require a short-term nursing home stay-anticipated 30 days or less rehabilitation andstrengthening. The plan is for return home.  Andrez Grime PA-C for Dr. Shelle Iron

## 2020-07-11 NOTE — Op Note (Signed)
Shelley Clarke, Shelley Clarke MEDICAL RECORD NO: [id number] ACCOUNT NO: [id number] DATE OF BIRTH: [birth year] F[facility]ILITY[facility] LOCATION: [facility] PHYSICIAN: Javier Docker, MD  Operative Report   PREOPERATIVE DIAGNOSES:  End-stage osteoarthrosis, valgus deformity of the right knee.  POSTOPERATIVE DIAGNOSES:  End-stage osteoarthrosis, valgus deformity of the right knee.  PROCEDURE PERFORMED:  Right total knee arthroplasty utilizing Attune DePuy rotating platform, 4 femur, 4 tibia, 5 mm insert, 32 patella.  ANESTHESIA:  Spinal.  ASSISTANT:  Andrez Grime, PA  HISTORY:  A 70 year old with end-stage osteoarthrosis, right knee lateral compartment, subchondral edema and subchondral fracture, severe pain.  Negative affect to her ability to ambulate.  The patient was indicated for total knee replacement.  Risks and  benefits were discussed including bleeding, infection, damage to neurovascular structures, no change in symptoms, worsening symptoms, DVT, PE, anesthetic complications, need for revision in the future, etc.  TECHNIQUE:  The patient in supine position, after induction of adequate spinal anesthesia and 2 grams Kefzol, the right lower extremity was prepped and draped and exsanguinated in usual sterile fashion.  Thigh tourniquet inflated to 200 mmHg.  Midline  incision was then made over the patella with a slight medial bias.  Subcutaneous tissue was dissected.  Electrocautery was utilized to achieve hemostasis.  A median parapatellar arthrotomy was performed.  Minimal elevation of soft tissues medially.   Patella everted, knee flexed.  Tricompartmental osteoarthrosis was noted, particularly the lateral compartment, which was bone on bone as well as in the patellofemoral joint.  I removed the remnants of the medial and lateral menisci.  Then removed the  ACL.  I notched the top of the notch of the femur.  Starting hole for the drill guide, entered the femoral canal with a drill, irrigated it using  T-handle and then a 5-degree right 9 off the distal femur was selected.  She had no flexion contracture.   This was then pinned.  I performed a distal femoral cut.  I sized the femur off the anterior cortex, 3 degrees of external rotation.  This was measured at a 4.  This was then pinned.  I placed the distal femoral cutting block, applied.  Then, I performed  the anterior, posterior and chamfer cuts, with the soft tissues protected posteriorly at all times with a large curved Crego.  Next, attention was turned towards the tibia, it was subluxed carefully with a McHale.  Further remnants of the medial and  lateral menisci were removed.  She had two defects, posterolaterally and posteromedially.  Used the external alignment guide, 2 off the defect, which is posterolaterally.  Bisecting the tibiotalar joint, 3 degrees slope, parallel to the shaft.  I then  performed our tibial cut without difficulty, protecting the soft tissues posteriorly at all times.  This was then sized to a 4, maximizing coverage, pinned.  I harvested bone graft from the tibial canal and impacted into the femoral canal.  I then  drilled centrally, used our punch guide.  Turned our attention back to the femur.  I used a box cut for the femur, bisecting the canal and the condyles.  This was then pinned, performed our box cut.  This was without difficulty.  Placed a trial femur,  drilled our lug nuts and then placed a 5 insert and then reduced the knee, I had full extension, full flexion, good stability to varus, valgus stressing at 0 and 30 degrees.  Negative anterior drawer.  With all of our cuts, we protected the medial and  lateral collateral ligaments with Cregos.  Next, attention was then turned to the patella.  It was everted, measured a 22, planed to a 15 with a patellar jig.  Following this, it measured to a 15.  We sized it to a 32, medializing the patella.  Drilled  our peg holes.  Following the drilling of the peg holes, I  reduced the patella and I had excellent patellofemoral tracking.  Good stability with varus and valgus stressing at 0 and 30 degrees.  Negative anterior drawer.  All instrumentation was removed.   I checked posteriorly. Capsule was intact as was the popliteus.  Pulsatile lavage was used to clean the bony surfaces.  I then used the Aquamantys to cauterize geniculates, posterior capsule.  Cement was mixed on the back table in the appropriate  fashion.  The knee was flexed, patella everted, tibia subluxed.  All surfaces thoroughly dried.  I injected cement into the tibial canal, digitally pressurizing it.  I cemented the permanent tibial tray.  Cement was on the tray.  Redundant cement removed  after impaction, I cemented the femur, cement on the component as well and impacted it, redundant cement removed, placed a trial 5 insert, reduced it and held in axial load throughout the curing of the cement.  Redundant cement removed.  I cemented and  clamped the patella.  0.25% Marcaine with epinephrine was placed in the wound while curing of the cement occurred.  The wound was covered.  Following this, I had full extension, full flexion and good stability.  I removed the trial, subluxed the tibia  carefully again and meticulously removed all redundant cement from around the tibial tray and femoral component.  We then copiously irrigated with IrriSept and pulsatile lavage.  I selected a 5 permanent insert and reduced the knee, had full extension,  full flexion and good stability to varus and valgus stressing at 0 and 30 degrees.  Following this, we placed some Exparel within the wound in the capsule and the quadriceps tendon.  Repaired the patellar arthrotomy with #1 Vicryl in interrupted  figure-of-eight sutures in slight flexion.  I then oversewed with a running Stratafix.  Prior to the insertion of the permanent insert, tourniquet was deflated at 68 minutes.  Any minor bleeding that occurred was cauterized with the  Aquamantys.  He had  flexion to gravity at 90 degrees.  Excellent patellofemoral tracking and good stability to varus and valgus stress at 0 and 30 degrees.  Negative anterior drawer.  Next, we irrigated the subcutaneous tissues as well, subcutaneous with 2-0 and skin with  Prolene.  Sterile dressing was applied.  Flexion to gravity was noted, 90 degrees.  Placed in immobilizer and transported to the recovery room in satisfactory condition.  The patient tolerated the procedure well.  No complications.  Assistant, Andrez Grime, [place].  BLOOD LOSS:  50 mL.   SHW D: 07/10/2020 9:48:17 am T: 07/10/2020 11:24:00 am  JOB: [id number]/ [id number]

## 2020-07-11 NOTE — Progress Notes (Signed)
Physical Therapy Treatment Patient Details Name: Shelley Clarke MRN: [id number] DOB: 1950-04-20 Today's Date: 07/11/2020    History of Present Illness Patient is 70 y.o. female s/p Rt TKA on 07/10/20 with PMH significant for bipolar disorder, Tardive dyskinesia, hypothyroidism, CKDIII, anxiety & depression, chest pains. Patient reports ongoing deficits from admissionin October 2019 for acute hypoxic respiratory failure secondary to CAP which left her with difficulty mobilizing and performing ADL's.    PT Comments    Assisted pt OOB and to recliner. Pt was able to self motivate to EOB with min guard +1 with strap to help slide leg off EOB. Some assistance seed to sit upright. Min assist +2 for safety to rise from elevated surface. Pt was able to take 4 steps forward with RW min assist +2 for safety. Gait terminated d/t pt c/o of nausea, dizziness and then feeling fait. Had pt sit in recliner. Vitals taken, BP 62/34, HR 51, O2 98%. Called nurse in to update. After rest in recliner BP 101/65. CIR was reviewed and pt was not appropriate, therefore new recommendation is for SNF. Will update evaluating PT.    Follow Up Recommendations  SNF     Equipment Recommendations  Rolling walker with 5" wheels;3in1 (PT)    Recommendations for Other Services       Precautions / Restrictions Precautions Precautions: Fall Precaution Comments: pt with history for ~5 falls in last 6 months (tardive dyskinesia) Restrictions Other Position/Activity Restrictions: WBAT    Mobility  Bed Mobility Overal bed mobility: Needs Assistance Bed Mobility: Supine to Sit     Supine to sit: Min assist;HOB elevated     General bed mobility comments: cues for sequencing and to bring R LE over and off EOB and min assist to raise trunk upright.    Transfers Overall transfer level: Needs assistance Equipment used: Rolling walker (2 wheeled) Transfers: Sit to/from [employer] Sit to Stand: From  elevated surface;Mod assist Stand pivot transfers: Mod assist;+2 safety/equipment;From elevated surface       General transfer comment: cues for safe hand placement for power up. some posterior lean, cues to shift weight anterior over toes and extend trunk.  Ambulation/Gait Ambulation/Gait assistance: Min assist Gait Distance (Feet): 2 Feet Assistive device: Rolling walker (2 wheeled) Gait Pattern/deviations: Step-to pattern;Decreased step length - right;Decreased step length - left;Antalgic Gait velocity: decreased   General Gait Details: pt was able to take a few steps forward, terminated gait d/t pt c/o of nausea, dizziness, then feeling fait.   Stairs             Wheelchair Mobility    Modified Rankin (Stroke Patients Only)       Balance                                            Cognition Arousal/Alertness: Awake/alert Behavior During Therapy: Flat affect Overall Cognitive Status: Within Functional Limits for tasks assessed                                        Exercises      General Comments        Pertinent Vitals/Pain Pain Assessment: 0-10 Pain Score: 6  Pain Location: Rt knee Pain Descriptors / Indicators: Aching;Discomfort Pain Intervention(s): Monitored during session;Ice applied  Home Living                      Prior Function            PT Goals (current goals can now be found in the care plan section) Acute Rehab PT Goals Patient Stated Goal: to improve Rt knee strength and improve balance to stop falling PT Goal Formulation: With patient/family Time For Goal Achievement: 07/24/20 Potential to Achieve Goals: Good Progress towards PT goals: Progressing toward goals    Frequency    7X/week      PT Plan      Co-evaluation              AM-PAC PT "6 Clicks" Mobility   Outcome Measure  Help needed turning from your back to your side while in a flat bed without using  bedrails?: A Little Help needed moving from lying on your back to sitting on the side of a flat bed without using bedrails?: A Little Help needed moving to and from a bed to a chair (including a wheelchair)?: A Lot Help needed standing up from a chair using your arms (e.g., wheelchair or bedside chair)?: A Lot Help needed to walk in hospital room?: Total Help needed climbing 3-5 steps with a railing? : Total 6 Click Score: 12    End of Session Equipment Utilized During Treatment: Gait belt Activity Tolerance: Treatment limited secondary to medical complications (Comment);Patient tolerated treatment well Patient left: in chair;with call bell/phone within reach;with bed alarm set;with family/visitor present Nurse Communication: Mobility status PT Visit Diagnosis: Muscle weakness (generalized) (M62.81);Unsteadiness on feet (R26.81);Other abnormalities of gait and mobility (R26.89);Other symptoms and signs involving the nervous system (R29.898);Difficulty in walking, not elsewhere classified (R26.2)     Time: 8101-7510 PT Time Calculation (min) (ACUTE ONLY): 25 min  Charges:  $Gait Training: 8-22 mins $Therapeutic Activity: 8-22 mins                    Shelley Clarke, PTA Student  Acute Rehabilitation Services Pager : [phone] Office : [phone]

## 2020-07-11 NOTE — NC FL2 (Addendum)
[place] MEDICAID FL2 LEVEL OF CARE SCREENING TOOL     IDENTIFICATION  Patient Name: Shelley Clarke Birthdate: May 05, 1950 Sex: female Admission Date (Current Location): 07/10/2020  [facility] and [place] Number:  Producer, television/film/video and Address:  [facility],  [address] [place], [place] [address]      Provider Number: [id number]  Attending Physician Name and Address:  Jene Every, MD  Relative Name and Phone Number:  Jaquisha Frech (husband) Ph: [phone]    Current Level of Care: Hospital Recommended Level of Care: Skilled Nursing Facility Prior Approval Number:    Date Approved/Denied:   PASRR Number: [id number]  Discharge Plan: SNF    Current Diagnoses: Patient Active Problem List   Diagnosis Date Noted   S/P TKR (total knee replacement) using cement 07/10/2020   Tardive dyskinesia 12/24/2019   Memory difficulties 12/24/2019   OCD (obsessive compulsive disorder) 12/28/2017   Generalized anxiety disorder 12/28/2017   Moderate depressed bipolar I disorder (HCC) 12/28/2017   Atypical chest pain 11/15/2017   CAP (community acquired pneumonia) 11/02/2017   Influenza A 11/02/2017   Acute respiratory failure with hypoxia (HCC) 11/02/2017   CKD (chronic kidney disease) stage 3, GFR 30-59 ml/min (HCC) 11/02/2017   Bipolar disorder (HCC) 11/02/2017   Anxiety 04/15/2015   Depression 04/15/2015   Hypothyroidism 04/15/2015    Orientation RESPIRATION BLADDER Height & Weight     Self, Time, Situation, Place  Normal Continent Weight: 121 lb 7.6 oz (55.1 kg) Height:  5\' 4"  (162.6 cm)  BEHAVIORAL SYMPTOMS/MOOD NEUROLOGICAL BOWEL NUTRITION STATUS      Continent Diet (Regular diet)  AMBULATORY STATUS COMMUNICATION OF NEEDS Skin   Extensive Assist Verbally Surgical wounds                       Personal Care Assistance Level of Assistance  Bathing, Feeding, Dressing Bathing Assistance: Limited assistance Feeding assistance: Independent Dressing  Assistance: Limited assistance     Functional Limitations Info  Sight, Hearing, Speech Sight Info: Impaired Hearing Info: Adequate Speech Info: Adequate    SPECIAL CARE FACTORS FREQUENCY  PT (By licensed PT), OT (By licensed OT)     PT Frequency: 5x's/week OT Frequency: 5x's/week            Contractures Contractures Info: Not present    Additional Factors Info  Code Status, Allergies, Psychotropic Code Status Info: Full Allergies Info: NKA Psychotropic Info: Wellbutrin, Klonopin, Prozac, Geodon         Current Medications (07/12/2020):  This is the current hospital active medication list Current Facility-Administered Medications  Medication Dose Route Frequency Provider Last Rate Last Admin   acetaminophen (TYLENOL) tablet 325-650 mg  325-650 mg Oral Q6H PRN 09/12/2020, MD   650 mg at 07/11/20 1812   acidophilus (RISAQUAD) capsule 1 capsule  1 capsule Oral Daily 07/13/20, MD   1 capsule at 07/12/20 0943   albuterol (PROVENTIL) (2.5 MG/3ML) 0.083% nebulizer solution 2.5 mg  2.5 mg Inhalation Q6H PRN 09/12/20, MD       alum & mag hydroxide-simeth (MAALOX/MYLANTA) 200-200-20 MG/5ML suspension 30 mL  30 mL Oral Q4H PRN 07-21-2000, MD       aspirin chewable tablet 81 mg  81 mg Oral BID Jene Every, MD   81 mg at 07/12/20 0945   bisacodyl (DULCOLAX) EC tablet 5 mg  5 mg Oral Daily PRN 09/12/20, MD       buPROPion (WELLBUTRIN XL) 24 hr tablet 300  mg  300 mg Oral q AM Jene Every, MD   300 mg at 07/12/20 5625   clonazePAM (KLONOPIN) tablet 0.5 mg  0.5 mg Oral BID Jene Every, MD   0.5 mg at 07/12/20 0943   clonazePAM (KLONOPIN) tablet 1 mg  1 mg Oral QHS Jene Every, MD   1 mg at 07/11/20 2212   diphenhydrAMINE (BENADRYL) 12.5 MG/5ML elixir 12.5-25 mg  12.5-25 mg Oral Q4H PRN Jene Every, MD       docusate sodium (COLACE) capsule 100 mg  100 mg Oral BID Jene Every, MD   100 mg at 07/12/20 0944   FLUoxetine (PROZAC) capsule 80 mg  80 mg  Oral Daily Jene Every, MD   80 mg at 07/12/20 0942   HYDROcodone-acetaminophen (NORCO) 7.5-325 MG per tablet 1-2 tablet  1-2 tablet Oral Q4H PRN Jene Every, MD   2 tablet at 07/12/20 0610   HYDROcodone-acetaminophen (NORCO/VICODIN) 5-325 MG per tablet 1-2 tablet  1-2 tablet Oral Q4H PRN Jene Every, MD   1 tablet at 07/11/20 2216   ketorolac (TORADOL) 15 MG/ML injection 7.5 mg  7.5 mg Intravenous BID PRN Jene Every, MD   7.5 mg at 07/11/20 1346   lamoTRIgine (LAMICTAL) tablet 300 mg  300 mg Oral QHS Jene Every, MD   300 mg at 07/11/20 2212   [START ON 07/14/2020] levothyroxine (SYNTHROID) tablet 100 mcg  100 mcg Oral Once per day on [place], Jeffrey, MD       levothyroxine (SYNTHROID) tablet 112 mcg  112 mcg Oral Once per day on Mon Tue Wed Thu Fri Sat Jene Every, MD   112 mcg at 07/12/20 0631   magnesium citrate solution 1 Bottle  1 Bottle Oral Once PRN Jene Every, MD       menthol-cetylpyridinium (CEPACOL) lozenge 3 mg  1 lozenge Oral PRN Jene Every, MD       Or   phenol (CHLORASEPTIC) mouth spray 1 spray  1 spray Mouth/Throat PRN Jene Every, MD       metoCLOPramide (REGLAN) tablet 5-10 mg  5-10 mg Oral Q8H PRN Jene Every, MD       Or   metoCLOPramide (REGLAN) injection 5-10 mg  5-10 mg Intravenous Q8H PRN Jene Every, MD       morphine 2 MG/ML injection 0.5-1 mg  0.5-1 mg Intravenous Q2H PRN Jene Every, MD       ondansetron (ZOFRAN) tablet 4 mg  4 mg Oral Q6H PRN Jene Every, MD       Or   ondansetron (ZOFRAN) injection 4 mg  4 mg Intravenous Q6H PRN Jene Every, MD       polyethylene glycol (MIRALAX / GLYCOLAX) packet 17 g  17 g Oral Daily PRN Jene Every, MD       valACYclovir (VALTREX) tablet 500 mg  500 mg Oral QHS Jene Every, MD   500 mg at 07/11/20 2213   ziprasidone (GEODON) capsule 160 mg  160 mg Oral QHS Jene Every, MD   160 mg at 07/11/20 2212   ziprasidone (GEODON) capsule 40 mg  40 mg Oral BID Jene Every, MD   40  mg at 07/12/20 6389     Discharge Medications: Please see discharge summary for a list of discharge medications.  Relevant Imaging Results:  Relevant Lab Results:   Additional Information SSN: [id number]  Amada Jupiter, LCSW

## 2020-07-11 NOTE — Plan of Care (Signed)

## 2020-07-11 NOTE — Progress Notes (Signed)
Physical Therapy Treatment Patient Details Name: Shelley Clarke MRN: [id number] DOB: 1950-07-20 Today's Date: 07/11/2020    History of Present Illness Patient is 70 y.o. female s/p Rt TKA on 07/10/20 with PMH significant for bipolar disorder, Tardive dyskinesia, hypothyroidism, CKDIII, anxiety & depression, chest pains. Patient reports ongoing deficits from admissionin October 2019 for acute hypoxic respiratory failure secondary to CAP which left her with difficulty mobilizing and performing ADL's.    PT Comments    Orthostatic Vitals: Supine: 114/52, 59bpm Sitting (dependent): 123/88, 60bpm Standing: 113/76, 73bpm After : 88/65, 81bpm Recovery: 116/52, 60bpm  POD #1 pm session  Performed series of orthostatic vitals with pt as pt continues to feel nausea and dizziness with standing and especially amb. Min assist +2 needed for sit to stand, gait and standing for 3 mins to assess vitals. Pt walked 5' and stood for the required time. Pt had c/o dizziness and exhibited pallor of the face and lips. Returned pt to recliner for recovery. Pt was motivated to do seated therapeutic exercise and had no complaints of increased pain or other symptoms during. Pt would benefit from continued PT to improve her functional independence and safety before safe discharge.    Follow Up Recommendations  SNF     Equipment Recommendations  Rolling walker with 5" wheels;3in1 (PT)    Recommendations for Other Services       Precautions / Restrictions Precautions Precautions: Fall Precaution Comments: pt with history for ~5 falls in last 6 months (tardive dyskinesia) Restrictions Other Position/Activity Restrictions: WBAT    Mobility  Bed Mobility Overal bed mobility: Needs Assistance Bed Mobility: Supine to Sit     Supine to sit: Min assist;HOB elevated     General bed mobility comments: cues for sequencing and to bring R LE over and off EOB and min assist to raise trunk upright.     Transfers Overall transfer level: Needs assistance Equipment used: Rolling walker (2 wheeled) Transfers: Sit to/from Stand Sit to Stand: Min assist;+2 safety/equipment Stand pivot transfers: Mod assist;+2 safety/equipment;From elevated surface       General transfer comment: cues for safe hand placement for power up. some posterior lean, cues to shift weight anterior over toes and extend trunk.  Ambulation/Gait Ambulation/Gait assistance: Min assist Gait Distance (Feet): 5 Feet Assistive device: Rolling walker (2 wheeled) Gait Pattern/deviations: Step-to pattern;Decreased step length - right;Decreased step length - left;Antalgic Gait velocity: decreased   General Gait Details:   Social research officer, government Rankin (Stroke Patients Only)       Balance                                            Cognition Arousal/Alertness: Awake/alert Behavior During Therapy: WFL for tasks assessed/performed Overall Cognitive Status: Within Functional Limits for tasks assessed                                        Exercises Total Joint Exercises Ankle Circles/Pumps: AROM;Both;10 reps;Supine Quad Sets: AROM;Right;5 reps;Supine Short Arc Quad: AROM;Right;5 reps;Supine Heel Slides: AROM;AAROM;Right;5 reps;Supine Straight Leg Raises: AROM;AAROM;Right;5 reps;Supine    General Comments        Pertinent Vitals/Pain Pain Assessment: Faces Pain Score: 6  Faces  Pain Scale: Hurts a little bit Pain Location: Rt knee Pain Descriptors / Indicators: Grimacing;Guarding Pain Intervention(s): Limited activity within patient's tolerance;Ice applied    Home Living                      Prior Function            PT Goals (current goals can now be found in the care plan section) Acute Rehab PT Goals Patient Stated Goal: to improve Rt knee strength and improve balance to stop falling PT Goal Formulation: With  patient/family Time For Goal Achievement: 07/24/20 Potential to Achieve Goals: Good Progress towards PT goals: Progressing toward goals    Frequency    7X/week      PT Plan      Co-evaluation              AM-PAC PT "6 Clicks" Mobility   Outcome Measure  Help needed turning from your back to your side while in a flat bed without using bedrails?: A Little Help needed moving from lying on your back to sitting on the side of a flat bed without using bedrails?: A Little Help needed moving to and from a bed to a chair (including a wheelchair)?: A Lot Help needed standing up from a chair using your arms (e.g., wheelchair or bedside chair)?: A Lot Help needed to walk in hospital room?: Total Help needed climbing 3-5 steps with a railing? : Total 6 Click Score: 12    End of Session Equipment Utilized During Treatment: Gait belt Activity Tolerance: Patient tolerated treatment well Patient left: in chair;with call bell/phone within reach;with bed alarm set;with family/visitor present Nurse Communication: Mobility status PT Visit Diagnosis: Muscle weakness (generalized) (M62.81);Unsteadiness on feet (R26.81);Other abnormalities of gait and mobility (R26.89);Other symptoms and signs involving the nervous system (R29.898);Difficulty in walking, not elsewhere classified (R26.2)     Time: 1093-2355 PT Time Calculation (min) (ACUTE ONLY): 25 min  Charges:  $Gait Training: 8-22 mins $Therapeutic Activity: 8-22 mins                     Alma Friendly, PTA Student  Acute Rehabilitation Services Pager : [phone] Office : [phone]    Alma Friendly 07/11/2020, 3:47 PM

## 2020-07-12 ENCOUNTER — Other Ambulatory Visit (HOSPITAL_COMMUNITY): Payer: Self-pay

## 2020-07-12 LAB — CBC
HCT: 30.3 % — ABNORMAL LOW (ref 36.0–46.0)
Hemoglobin: 10 g/dL — ABNORMAL LOW (ref 12.0–15.0)
MCH: 33.7 pg (ref 26.0–34.0)
MCHC: 33 g/dL (ref 30.0–36.0)
MCV: 102 fL — ABNORMAL HIGH (ref 80.0–100.0)
Platelets: 147 10*3/uL — ABNORMAL LOW (ref 150–400)
RBC: 2.97 MIL/uL — ABNORMAL LOW (ref 3.87–5.11)
RDW: 13.4 % (ref 11.5–15.5)
WBC: 6.5 10*3/uL (ref 4.0–10.5)
nRBC: 0 % (ref 0.0–0.2)

## 2020-07-12 MED ORDER — HYDROCODONE-ACETAMINOPHEN 7.5-325 MG PO TABS
1.0000 | ORAL_TABLET | ORAL | Status: DC | PRN
  Administered 2020-07-13 – 2020-07-15 (×7): 1 via ORAL
  Filled 2020-07-12 (×7): qty 1

## 2020-07-12 MED ORDER — HYDROCODONE-ACETAMINOPHEN 7.5-325 MG PO TABS
1.0000 | ORAL_TABLET | ORAL | 0 refills | Status: DC | PRN
  Filled 2020-07-12: qty 40, 6d supply, fill #0

## 2020-07-12 MED ORDER — SODIUM CHLORIDE 0.9 % IV BOLUS
250.0000 mL | Freq: Once | INTRAVENOUS | Status: AC
  Administered 2020-07-12: 250 mL via INTRAVENOUS

## 2020-07-12 NOTE — Progress Notes (Addendum)
Physical Therapy Treatment Patient Details Name: Shelley Clarke MRN: [id number] DOB: Nov 12, 1950 Today's Date: 07/12/2020    History of Present Illness Patient is 70 y.o. female s/p Rt TKA on 07/10/20 with PMH significant for bipolar disorder, Tardive dyskinesia, hypothyroidism, CKDIII, anxiety & depression, chest pains. Patient reports ongoing deficits from admissionin October 2019 for acute hypoxic respiratory failure secondary to CAP which left her with difficulty mobilizing and performing ADL's.    PT Comments    POD#3 pm session Pt OOB in recliner. Min assist +2 for sit to stand from recliner in hall. Min assist +2 for safety for 18' ambulation from hall to bed. Gait continues to be antalgic with decreased speed with noted fatigue. Cues to look forward to extend trunk. BP continues to drop during exertion but trending better with increase in activity and volume.   VITALS:  At rest: 121/45 60bpm in recliner  After ambulation: 18 feet  120/71  standing Recovery: 132/77 59bpm supine in bed   Follow Up Recommendations  SNF     Equipment Recommendations  Rolling walker with 5" wheels;3in1 (PT)    Recommendations for Other Services OT consult;Rehab consult     Precautions / Restrictions Precautions Precautions: Fall Precaution Comments: pt with history for ~5 falls in last 6 months (tardive dyskinesia) Restrictions Weight Bearing Restrictions: No Other Position/Activity Restrictions: WBAT    Mobility  Bed Mobility Overal bed mobility: Needs Assistance Bed Mobility: Sit to Supine     Supine to sit: Min guard;HOB elevated          Transfers Overall transfer level: Needs assistance Equipment used: Rolling walker (2 wheeled) Transfers: Sit to/from Stand Sit to Stand: Min assist;+2 safety/equipment         General transfer comment: cues for safe hand placement for power up. some posterior lean.  Ambulation/Gait Ambulation/Gait assistance: Min assist Gait Distance  (Feet): 18 Feet Assistive device: Rolling walker (2 wheeled) Gait Pattern/deviations: Step-to pattern;Decreased step length - right;Decreased step length - left;Antalgic Gait velocity: decreased   General Gait Details: +2 min assist from recliner in hall to bed.   Stairs             Wheelchair Mobility    Modified Rankin (Stroke Patients Only)       Balance                                            Cognition Arousal/Alertness: Awake/alert Behavior During Therapy: WFL for tasks assessed/performed Overall Cognitive Status: Within Functional Limits for tasks assessed                                        Exercises Total Joint Exercises Ankle Circles/Pumps: AROM;Both;10 reps;Supine Quad Sets: AROM;Right;5 reps;Supine Short Arc Quad: AROM;Right;5 reps;Supine Heel Slides: AROM;AAROM;Right;5 reps;Supine    General Comments        Pertinent Vitals/Pain Pain Assessment: 0-10 Pain Score: 8  Pain Location: Rt knee Pain Descriptors / Indicators: Grimacing;Guarding;Sore Pain Intervention(s): Limited activity within patient's tolerance;Ice applied    Home Living                      Prior Function            PT Goals (current goals can now be found in the  care plan section) Acute Rehab PT Goals Patient Stated Goal: to improve Rt knee strength and improve balance to stop falling PT Goal Formulation: With patient/family Time For Goal Achievement: 07/24/20 Potential to Achieve Goals: Good Progress towards PT goals: Progressing toward goals    Frequency    7X/week      PT Plan      Co-evaluation              AM-PAC PT "6 Clicks" Mobility   Outcome Measure  Help needed turning from your back to your side while in a flat bed without using bedrails?: A Little Help needed moving from lying on your back to sitting on the side of a flat bed without using bedrails?: A Little Help needed moving to and from a  bed to a chair (including a wheelchair)?: A Lot Help needed standing up from a chair using your arms (e.g., wheelchair or bedside chair)?: A Lot Help needed to walk in hospital room?: Total Help needed climbing 3-5 steps with a railing? : Total 6 Click Score: 12    End of Session Equipment Utilized During Treatment: Gait belt Activity Tolerance: Patient tolerated treatment well Patient left: in bed;with call bell/phone within reach;with bed alarm set;with nursing/sitter in room;with family/visitor present Nurse Communication: Mobility status PT Visit Diagnosis: Muscle weakness (generalized) (M62.81);Unsteadiness on feet (R26.81);Other abnormalities of gait and mobility (R26.89);Other symptoms and signs involving the nervous system (R29.898);Difficulty in walking, not elsewhere classified (R26.2)     Time: 4944-9675 PT Time Calculation (min) (ACUTE ONLY): 27 min  Charges:  $Gait Training: 8-22 mins $Therapeutic Exercise: 8-22 mins                    Alma Friendly, PTA Student  Acute Rehabilitation Services Pager : [phone] Office : [phone]    Alma Friendly 07/12/2020, 3:18 PM  I agree with the following treatment note.  This session was performed under the supervision of a licensed clinician  Felecia Shelling  PTA Acute  Rehabilitation Services Pager      [phone] Office      [phone]

## 2020-07-12 NOTE — Plan of Care (Signed)
  Problem: Education: Goal: Knowledge of General Education information will improve Description Including pain rating scale, medication(s)/side effects and non-pharmacologic comfort measures Outcome: Progressing   Problem: Health Behavior/Discharge Planning: Goal: Ability to manage health-related needs will improve Outcome: Progressing   

## 2020-07-12 NOTE — TOC Progression Note (Addendum)
Transition of Care [facility]) - Progression Note    Patient Details  Name: Shelley Clarke MRN: [id number] Date of Birth: 1950-06-28  [facility]) CM/SW Contact  Lennart Pall, LCSW Phone Number: 07/12/2020, 4:31 PM  Clinical Narrative:     Met with pt and spouse today to discuss change in dc plans.  Pt reports they have spoken with MD and prefer a dc home with [employer].  Noted MD in agreement and allowing her to "stay here until I can walk to the bathroom."   Orders placed for HHPT and pt requests [facility]  - referral placed.  Have also ordered a rolling walker via [employer] and pt aware she will need to pay privately for this - agreeable.  No further TOC needs at this time.  Will continue to follow.  Expected Discharge Plan: [place] Barriers to Discharge: Continued Medical Work up  Expected Discharge Plan and Services Expected Discharge Plan: [place] In-house Referral: Clinical Social Work   Post Acute Care Choice: [place] arrangements for the past 2 months: Single Family Home                 DME Arranged: Walker rolling DME Agency: [employer] Date DME Agency Contacted: 07/12/20 Time DME Agency Contacted: [phone] Representative spoke with at DME Agency: Freda Munro HH Arranged: PT [place]: [place] Date [place]: 07/12/20 Time [place]: 1631 Representative spoke with at Section: [place] Determinants of Health ([place]) Interventions    Readmission Risk Interventions No flowsheet data found.

## 2020-07-12 NOTE — Anesthesia Postprocedure Evaluation (Signed)
Anesthesia Post Note  Patient: Shelley Clarke  Procedure(s) Performed: TOTAL KNEE ARTHROPLASTY (Right: Knee)     Patient location during evaluation: PACU Anesthesia Type: Spinal Level of consciousness: awake and alert Pain management: pain level controlled Vital Signs Assessment: post-procedure vital signs reviewed and stable Respiratory status: spontaneous breathing Cardiovascular status: stable Postop Assessment: spinal receding Anesthetic complications: no   No notable events documented.  Last Vitals:  Vitals:   07/12/20 0545 07/12/20 0938  BP: 123/63 120/67  Pulse: (!) 56 (!) 59  Resp: 16   Temp: 36.6 C   SpO2: 96% 100%    Last Pain:  Vitals:   07/12/20 0938  TempSrc:   PainSc: 4                  Lewie Loron

## 2020-07-12 NOTE — Progress Notes (Signed)
Called [place], [place] and made her aware of patient's latest orthostatic vitals. She stated that is an improvement from yesterday. No new orders. Now waiting on barriers to discharge to resolve (home health versus SNF and equipment needs/Medicare paying for equipment).

## 2020-07-12 NOTE — Progress Notes (Signed)
Subjective: 2 Days Post-Op Procedure(s) (LRB): TOTAL KNEE ARTHROPLASTY (Right) Patient reports pain as moderate and severe.    Objective: Vital signs in last 24 hours: Temp:  [97.7 F (36.5 C)-98.2 F (36.8 C)] 97.8 F (36.6 C) (07/01 0545) Pulse Rate:  [50-59] 59 (07/01 0938) Resp:  [16] 16 (07/01 0545) BP: (94-123)/(51-67) 120/67 (07/01 0938) SpO2:  [96 %-100 %] 100 % (07/01 0938)  Intake/Output from previous day: 06/30 0701 - 07/01 0700 In: 1180 [P.O.:680; IV Piggyback:500] Out: 800 [Urine:800] Intake/Output this shift: Total I/O In: 360 [P.O.:360] Out: -   Recent Labs    07/11/20 0334 07/12/20 0250  HGB 11.5* 10.0*   Recent Labs    07/11/20 0334 07/12/20 0250  WBC 9.6 6.5  RBC 3.44* 2.97*  HCT 34.5* 30.3*  PLT 168 147*   Recent Labs    07/11/20 0334  NA 140  K 4.2  CL 106  CO2 30  BUN 14  CREATININE 0.98  GLUCOSE 156*  CALCIUM 9.1   No results for input(s): LABPT, INR in the last 72 hours.  Neurologically intact ABD soft Neurovascular intact Sensation intact distally Intact pulses distally Dorsiflexion/Plantar flexion intact Incision: dressing C/D/I and no drainage No cellulitis present Compartment soft No sign of DVT   Assessment/Plan: 2 Days Post-Op Procedure(s) (LRB): TOTAL KNEE ARTHROPLASTY (Right) Advance diet Up with therapy D/C IV fluids Pt preferes to be able to go home when ready instead of SNF. Will watch her progress with PT and when ready can D/C home.   Dorothy Spark 07/12/2020, 12:53 PM

## 2020-07-12 NOTE — Progress Notes (Addendum)
Physical Therapy Treatment Patient Details Name: Shelley Clarke MRN: [id number] DOB: 1950-12-08 Today's Date: 07/12/2020    History of Present Illness Patient is 70 y.o. female s/p Rt TKA on 07/10/20 with PMH significant for bipolar disorder, Tardive dyskinesia, hypothyroidism, CKDIII, anxiety & depression, chest pains. Patient reports ongoing deficits from admissionin October 2019 for acute hypoxic respiratory failure secondary to CAP which left her with difficulty mobilizing and performing ADL's.    PT Comments    Orthostatic Vitals: Supine: 119/58, 64bpm Dependent sitting : 126/106 67bpm Standing: 119/75 71bpm After 3 mins: 107/66 80bpm Recovery: 124/62 63bpm  POD #2 am session Performed series of orthostatic vitals as pt continues to feel dizziness with standing and amb. Min assist +2 needed for sit to stand, gait and standing for 3 mins to assess vitals. Amb decreased due to increased pain, dizziness and "head feeling funny". Nurse communication: Updated nurse on pt orthostatic status, asked RN to contact PA.    Follow Up Recommendations  SNF     Equipment Recommendations  Rolling walker with 5" wheels;3in1 (PT)    Recommendations for Other Services OT consult;Rehab consult     Precautions / Restrictions Precautions Precautions: Fall Precaution Comments: pt with history for ~5 falls in last 6 months (tardive dyskinesia) Restrictions Weight Bearing Restrictions: No RLE Weight Bearing: Weight bearing as tolerated Other Position/Activity Restrictions: WBAT    Mobility  Bed Mobility Overal bed mobility: Needs Assistance Bed Mobility: Supine to Sit     Supine to sit: Min guard;HOB elevated          Transfers Overall transfer level: Needs assistance Equipment used: Rolling walker (2 wheeled) Transfers: Sit to/from Stand Sit to Stand: Min assist;+2 safety/equipment         General transfer comment: cues for safe hand placement for power up. some posterior lean,  cues to shift weight anterior over toes and extend trunk.  Ambulation/Gait Ambulation/Gait assistance: Min assist Gait Distance (Feet): 3 Feet Assistive device: Rolling walker (2 wheeled) Gait Pattern/deviations: Step-to pattern;Decreased step length - right;Decreased step length - left;Antalgic Gait velocity: decreased   General Gait Details: pt was able to take a few steps forward, terminated gait d/t pt c/o of nausea, dizziness and head feeling "funny"   Stairs             Wheelchair Mobility    Modified Rankin (Stroke Patients Only)       Balance                                            Cognition Arousal/Alertness: Awake/alert Behavior During Therapy: WFL for tasks assessed/performed Overall Cognitive Status: Within Functional Limits for tasks assessed                                        Exercises Total Joint Exercises Ankle Circles/Pumps: AROM;Both;10 reps;Supine Quad Sets: AROM;Right;5 reps;Supine Short Arc Quad: AROM;Right;5 reps;Supine Heel Slides: AROM;AAROM;Right;5 reps;Supine    General Comments        Pertinent Vitals/Pain Pain Assessment: 0-10 Pain Score: 7  Pain Location: Rt knee Pain Descriptors / Indicators: Grimacing;Guarding Pain Intervention(s): Limited activity within patient's tolerance;Ice applied    Home Living  Prior Function            PT Goals (current goals can now be found in the care plan section) Acute Rehab PT Goals Patient Stated Goal: to improve Rt knee strength and improve balance to stop falling PT Goal Formulation: With patient/family Time For Goal Achievement: 07/24/20 Potential to Achieve Goals: Good Progress towards PT goals: Progressing toward goals    Frequency    7X/week      PT Plan      Co-evaluation              AM-PAC PT "6 Clicks" Mobility   Outcome Measure  Help needed turning from your back to your side while in  a flat bed without using bedrails?: A Little Help needed moving from lying on your back to sitting on the side of a flat bed without using bedrails?: A Little Help needed moving to and from a bed to a chair (including a wheelchair)?: A Lot Help needed standing up from a chair using your arms (e.g., wheelchair or bedside chair)?: A Lot Help needed to walk in hospital room?: Total Help needed climbing 3-5 steps with a railing? : Total 6 Click Score: 12    End of Session Equipment Utilized During Treatment: Gait belt Activity Tolerance: Patient tolerated treatment well Patient left: in chair;with call bell/phone within reach;with bed alarm set;with family/visitor present Nurse Communication: Mobility status PT Visit Diagnosis: Muscle weakness (generalized) (M62.81);Unsteadiness on feet (R26.81);Other abnormalities of gait and mobility (R26.89);Other symptoms and signs involving the nervous system (R29.898);Difficulty in walking, not elsewhere classified (R26.2)     Time:  - 12:15 - 12;42    Charges:    1 gt  1 ta                    Alma Friendly, PTA Student  Acute Rehabilitation Services Pager : [phone] Office : [phone]    Alma Friendly 07/12/2020, 1:32 PM  I agree with the following treatment note.  This session was performed under the supervision of a licensed clinician  Felecia Shelling  PTA Acute  Rehabilitation Services Pager      [phone] Office      [phone]

## 2020-07-13 LAB — CBC
HCT: 29.5 % — ABNORMAL LOW (ref 36.0–46.0)
Hemoglobin: 9.7 g/dL — ABNORMAL LOW (ref 12.0–15.0)
MCH: 33.2 pg (ref 26.0–34.0)
MCHC: 32.9 g/dL (ref 30.0–36.0)
MCV: 101 fL — ABNORMAL HIGH (ref 80.0–100.0)
Platelets: 138 10*3/uL — ABNORMAL LOW (ref 150–400)
RBC: 2.92 MIL/uL — ABNORMAL LOW (ref 3.87–5.11)
RDW: 13.3 % (ref 11.5–15.5)
WBC: 7.3 10*3/uL (ref 4.0–10.5)
nRBC: 0 % (ref 0.0–0.2)

## 2020-07-13 NOTE — Plan of Care (Signed)
  Problem: Education: Goal: Knowledge of General Education information will improve Description Including pain rating scale, medication(s)/side effects and non-pharmacologic comfort measures Outcome: Progressing   

## 2020-07-13 NOTE — Progress Notes (Signed)
Physical Therapy Treatment Patient Details Name: Shelley Clarke MRN: [id number] DOB: [birth year] Today's Date: 07/13/2020    History of Present Illness Patient is 70 y.o. female s/p Rt TKA on 07/10/20 with PMH significant for bipolar disorder, Tardive dyskinesia, hypothyroidism, CKDIII, anxiety & depression, chest pains. Patient reports ongoing deficits from admissionin October 2019 for acute hypoxic respiratory failure secondary to CAP which left her with difficulty mobilizing and performing ADL's.    PT Comments    Progressing with mobility. Pain rated 8/10. Pt denied dizziness during session. Pt and husband stated they no longer wish to d/c to a rehab. Plan is now for home. Will continue to follow and progress activity as tolerated.     Follow Up Recommendations  SNF;Supervision/Assistance - 24 hour (Pt/family decline placement. Plan is now for home with HHPT?)     Equipment Recommendations  Rolling walker with 5" wheels;3in1 (PT)    Recommendations for Other Services       Precautions / Restrictions Precautions Precautions: Fall Precaution Comments: pt with history for ~5 falls in last 6 months (tardive dyskinesia) Restrictions Weight Bearing Restrictions: No RLE Weight Bearing: Weight bearing as tolerated    Mobility  Bed Mobility               General bed mobility comments: oob in recliner    Transfers Overall transfer level: Needs assistance Equipment used: Rolling walker (2 wheeled) Transfers: Sit to/from Stand Sit to Stand: Min assist         General transfer comment: Cues for safety, technique, hand placement. Increased time. Unsteady with leaning.  Ambulation/Gait Ambulation/Gait assistance: Min assist;+2 safety/equipment Gait Distance (Feet): 40 Feet   Gait Pattern/deviations: Step-to pattern;Step-through pattern;Decreased step length - right;Decreased step length - left;Decreased stride length     General Gait Details: Pt began with a step through  pattern but slowly transitioned to a step through pattern intermittently. Cues for safety, sequencing, RW proximity, step lengths/widths. Husband followed with recliner. Pt denied dizziness.   Stairs             Wheelchair Mobility    Modified Rankin (Stroke Patients Only)       Balance Overall balance assessment: Needs assistance;History of Falls         Standing balance support: Bilateral upper extremity supported Standing balance-Leahy Scale: Poor                              Cognition Arousal/Alertness: Awake/alert Behavior During Therapy: WFL for tasks assessed/performed Overall Cognitive Status: Within Functional Limits for tasks assessed                                        Exercises Total Joint Exercises Ankle Circles/Pumps: AROM;Both;10 reps Quad Sets: AROM;Right;10 reps Heel Slides: AAROM;AROM;Right;10 reps Hip ABduction/ADduction: AROM;Right;10 reps Straight Leg Raises: AROM;Right;10 reps Goniometric ROM: ~10-70 degrees    General Comments        Pertinent Vitals/Pain Pain Assessment: 0-10 Pain Score: 8  Pain Location: Rt knee Pain Descriptors / Indicators: Discomfort;Grimacing;Guarding;Aching Pain Intervention(s): Limited activity within patient's tolerance;Monitored during session;Ice applied;Repositioned    Home Living                      Prior Function            PT Goals (current goals can now  be found in the care plan section) Progress towards PT goals: Progressing toward goals    Frequency    7X/week      PT Plan Current plan remains appropriate    Co-evaluation              AM-PAC PT "6 Clicks" Mobility   Outcome Measure  Help needed turning from your back to your side while in a flat bed without using bedrails?: A Little Help needed moving from lying on your back to sitting on the side of a flat bed without using bedrails?: A Little   Help needed standing up from a chair  using your arms (e.g., wheelchair or bedside chair)?: A Little Help needed to walk in hospital room?: A Lot Help needed climbing 3-5 steps with a railing? : A Lot 6 Click Score: 13    End of Session Equipment Utilized During Treatment: Gait belt Activity Tolerance: Patient tolerated treatment well Patient left: in chair;with call bell/phone within reach;with family/visitor present   PT Visit Diagnosis: Other abnormalities of gait and mobility (R26.89);Pain Pain - Right/Left: Right Pain - part of body: Knee     Time: 3500-9381 PT Time Calculation (min) (ACUTE ONLY): 35 min  Charges:  $Gait Training: 8-22 mins $Therapeutic Exercise: 8-22 mins                        Faye Ramsay, PT Acute Rehabilitation  Office: [phone] Pager: [phone]

## 2020-07-13 NOTE — Progress Notes (Signed)
Physical Therapy Treatment Patient Details Name: Shelley Clarke MRN: [id number] DOB: [birth year] Today's Date: 07/13/2020    History of Present Illness Patient is 70 y.o. female s/p Rt TKA on 07/10/20 with PMH significant for bipolar disorder, Tardive dyskinesia, hypothyroidism, CKDIII, anxiety & depression, chest pains. Patient reports ongoing deficits from admissionin October 2019 for acute hypoxic respiratory failure secondary to CAP which left her with difficulty mobilizing and performing ADL's.    PT Comments    Pt continues to participate well. Moderate pain with activity. Pt is hopeful to be able to d/c home tomorrow if she meets her PT goals. Husband has been present during sessions and he stated he feels comfortable assisting pt as needed as long as BP is stable.     Follow Up Recommendations  SNF (Pt family decline placement. If pt returns home, recommend HHPT and 24/7 supervision/assist)     Equipment Recommendations  Rolling walker with 5" wheels;3in1 (PT)    Recommendations for Other Services       Precautions / Restrictions Precautions Precautions: Fall Precaution Comments: pt with history for ~5 falls in last 6 months (tardive dyskinesia) Restrictions Weight Bearing Restrictions: No RLE Weight Bearing: Weight bearing as tolerated    Mobility  Bed Mobility               General bed mobility comments: oob in recliner    Transfers Overall transfer level: Needs assistance Equipment used: Rolling walker (2 wheeled) Transfers: Sit to/from Stand Sit to Stand: Min assist         General transfer comment: Cues for safety, technique, hand placement. Increased time. Unsteady with leaning to L side.  Ambulation/Gait Ambulation/Gait assistance: Min assist Gait Distance (Feet): 55 Feet Assistive device: Rolling walker (2 wheeled) Gait Pattern/deviations: Step-to pattern;Step-through pattern;Decreased stride length     General Gait Details: Pt began with a  step through pattern but slowly transitioned to a step through pattern intermittently. Cues for safety, sequencing, RW proximity, step lengths/widths. Pt c/o some dizziness once seated back on bed-BP WNL   Stairs             Wheelchair Mobility    Modified Rankin (Stroke Patients Only)       Balance Overall balance assessment: Needs assistance         Standing balance support: Bilateral upper extremity supported Standing balance-Leahy Scale: Poor                              Cognition Arousal/Alertness: Awake/alert Behavior During Therapy: WFL for tasks assessed/performed Overall Cognitive Status: Within Functional Limits for tasks assessed                                        Exercises      General Comments        Pertinent Vitals/Pain Pain Assessment: 0-10 Pain Score: 8  Pain Location: Rt knee Pain Descriptors / Indicators: Discomfort;Grimacing;Guarding;Aching Pain Intervention(s): Limited activity within patient's tolerance;Monitored during session;Repositioned    Home Living                      Prior Function            PT Goals (current goals can now be found in the care plan section) Progress towards PT goals: Progressing toward goals    Frequency  7X/week      PT Plan Current plan remains appropriate    Co-evaluation              AM-PAC PT "6 Clicks" Mobility   Outcome Measure  Help needed turning from your back to your side while in a flat bed without using bedrails?: A Little Help needed moving from lying on your back to sitting on the side of a flat bed without using bedrails?: A Little Help needed moving to and from a bed to a chair (including a wheelchair)?: A Little Help needed standing up from a chair using your arms (e.g., wheelchair or bedside chair)?: A Little Help needed to walk in hospital room?: A Little Help needed climbing 3-5 steps with a railing? : A Lot 6 Click  Score: 17    End of Session Equipment Utilized During Treatment: Gait belt Activity Tolerance: Patient tolerated treatment well Patient left: in bed;with call bell/phone within reach;with bed alarm set;with family/visitor present   PT Visit Diagnosis: Other abnormalities of gait and mobility (R26.89);Pain Pain - Right/Left: Right Pain - part of body: Knee     Time: 8099-8338 PT Time Calculation (min) (ACUTE ONLY): 12 min  Charges:  $Gait Training: 8-22 mins                         Faye Ramsay, PT Acute Rehabilitation  Office: [phone] Pager: [phone]

## 2020-07-13 NOTE — Progress Notes (Signed)
Subjective: 3 Days Post-Op Procedure(s) (LRB): TOTAL KNEE ARTHROPLASTY (Right) Patient reports pain as severe.   Per nursing needed a bouls last night due to low BP.BP this am within normal limits, no complaints of dizziness.  Ambulation limited due to pain. Tolerating PO without N/V +void, +flatus Denies sweats/chills. Denies calf pain, SOB, CP  Objective: Vital signs in last 24 hours: Temp:  [97.6 F (36.4 C)-98.1 F (36.7 C)] 98 F (36.7 C) (07/02 0538) Pulse Rate:  [54-62] 57 (07/02 0538) Resp:  [14-16] 14 (07/02 0538) BP: (93-127)/(47-68) 127/68 (07/02 0538) SpO2:  [96 %-100 %] 96 % (07/02 0538)  Intake/Output from previous day: 07/01 0701 - 07/02 0700 In: 1560 [P.O.:1560] Out: -  Intake/Output this shift: No intake/output data recorded.  Recent Labs    07/11/20 0334 07/12/20 0250 07/13/20 0325  HGB 11.5* 10.0* 9.7*   Recent Labs    07/12/20 0250 07/13/20 0325  WBC 6.5 7.3  RBC 2.97* 2.92*  HCT 30.3* 29.5*  PLT 147* 138*   Recent Labs    07/11/20 0334  NA 140  K 4.2  CL 106  CO2 30  BUN 14  CREATININE 0.98  GLUCOSE 156*  CALCIUM 9.1   No results for input(s): LABPT, INR in the last 72 hours.  Neurologically intact ABD soft Neurovascular intact Sensation intact distally Intact pulses distally Dorsiflexion/Plantar flexion intact Incision: dressing C/D/I and no drainage No cellulitis present Compartment soft No sign of DVT   Assessment/Plan: 3 Days Post-Op Procedure(s) (LRB): TOTAL KNEE ARTHROPLASTY (Right) Advance diet Up with therapy D/C IV fluids Pt preferes to be able to go home when ready instead of SNF. Will watch her progress with PT and when ready can D/C home.   Rhodia Albright 07/13/2020, 8:57 AM

## 2020-07-14 NOTE — Plan of Care (Signed)
  Problem: Activity: Goal: Risk for activity intolerance will decrease Outcome: Progressing   Problem: Elimination: Goal: Will not experience complications related to bowel motility Outcome: Progressing   Problem: Pain Managment: Goal: General experience of comfort will improve Outcome: Progressing   

## 2020-07-14 NOTE — Plan of Care (Signed)

## 2020-07-14 NOTE — Plan of Care (Signed)
  Problem: Nutrition: Goal: Adequate nutrition will be maintained Outcome: Progressing   Problem: Coping: Goal: Level of anxiety will decrease Outcome: Progressing   Problem: Elimination: Goal: Will not experience complications related to urinary retention Outcome: Progressing   Problem: Pain Managment: Goal: General experience of comfort will improve Outcome: Progressing   

## 2020-07-14 NOTE — Progress Notes (Signed)
Just spoke with Ralene Bathe, PA to inform her that this patient still has not had a BM despite having Colace, Miralax, Dulcolax, and Magnesium Citrate. Asked Ralene Bathe, PA about a fleets enema for the patient and she requested that we give the patient time for these stool softeners/laxatives to work. Ralene Bathe, PA requested that patient have a BM prior to discharge due to patient's history of chronic constipation. Made patient aware of Tracy's orders and she is agreeable to stay here until she has a BM. Will continue to monitor patient.

## 2020-07-14 NOTE — Progress Notes (Signed)
Physical Therapy Treatment Patient Details Name: Shelley Clarke MRN: [id number] DOB: 1951/01/03 Today's Date: 07/14/2020    History of Present Illness Patient is 70 y.o. female s/p Rt TKA on 07/10/20 with PMH significant for bipolar disorder, Tardive dyskinesia, hypothyroidism, CKDIII, anxiety & depression, chest pains. Patient reports ongoing deficits from admissionin October 2019 for acute hypoxic respiratory failure secondary to CAP which left her with difficulty mobilizing and performing ADL's.    PT Comments    Progressing with mobility. Reviewed/practiced exercises, gait training and stair training.Husband is aware that he will need to provide hands-on assistance 2* pt's history of balance issues. Recommended they take and use gait belt for safety. Issued HEP for pt to perform until HHPT begins. All education completed. Okay to d/c from PT standpoint.     Follow Up Recommendations  SNF (pt/family decline placement, so plan is for HHPT and 24/7 supervision/assist)     Equipment Recommendations  Rolling walker with 5" wheels;3in1 (PT)    Recommendations for Other Services       Precautions / Restrictions Precautions Precautions: Fall Precaution Comments: pt with history for ~5 falls in last 6 months (tardive dyskinesia) Restrictions Weight Bearing Restrictions: No RLE Weight Bearing: Weight bearing as tolerated    Mobility  Bed Mobility               General bed mobility comments: oob in recliner    Transfers Overall transfer level: Needs assistance Equipment used: Rolling walker (2 wheeled) Transfers: Sit to/from Stand Sit to Stand: Min guard         General transfer comment: Cues for safety, technique, hand placement. Increased time. Min guard A  Ambulation/Gait Ambulation/Gait assistance: Min Chemical engineer (Feet): 60 Feet Assistive device: Rolling walker (2 wheeled) Gait Pattern/deviations: Step-to pattern;Step-through pattern;Decreased stride  length;Antalgic     General Gait Details: Pt began with a step through pattern but slowly transitioned to a step through pattern intermittently-depends on pain level. Cues for safety, sequencing, RW proximity, step lengths/widths.   Stairs Stairs: Yes Stairs assistance: Min assist Stair Management: Step to pattern;Forwards;With walker Number of Stairs: 1 General stair comments: up and over portable 1 steps. Husband present to observe. Cues for safety, technique, sequence. Assist to steady and manage RW.   Wheelchair Mobility    Modified Rankin (Stroke Patients Only)       Balance Overall balance assessment: Needs assistance         Standing balance support: Bilateral upper extremity supported Standing balance-Leahy Scale: Poor                              Cognition Arousal/Alertness: Awake/alert Behavior During Therapy: WFL for tasks assessed/performed Overall Cognitive Status: Within Functional Limits for tasks assessed                                        Exercises Total Joint Exercises Ankle Circles/Pumps: AROM;Both;10 reps Quad Sets: AROM;Both;10 reps Heel Slides: AAROM;Right;10 reps;AROM Hip ABduction/ADduction: AROM;Right;10 reps Straight Leg Raises: AROM;Right;10 reps Goniometric ROM: ~10-70 degrees    General Comments        Pertinent Vitals/Pain Pain Assessment: 0-10 Pain Score: 7  Pain Location: Rt knee Pain Descriptors / Indicators: Discomfort;Grimacing;Guarding;Aching Pain Intervention(s): Monitored during session;Repositioned;Ice applied    Home Living  Prior Function            PT Goals (current goals can now be found in the care plan section) Progress towards PT goals: Progressing toward goals    Frequency    7X/week      PT Plan Current plan remains appropriate    Co-evaluation              AM-PAC PT "6 Clicks" Mobility   Outcome Measure  Help needed  turning from your back to your side while in a flat bed without using bedrails?: A Little Help needed moving from lying on your back to sitting on the side of a flat bed without using bedrails?: A Little Help needed moving to and from a bed to a chair (including a wheelchair)?: A Little Help needed standing up from a chair using your arms (e.g., wheelchair or bedside chair)?: A Little Help needed to walk in hospital room?: A Little Help needed climbing 3-5 steps with a railing? : A Little 6 Click Score: 18    End of Session Equipment Utilized During Treatment: Gait belt Activity Tolerance: Patient tolerated treatment well Patient left: in chair;with call bell/phone within reach;with family/visitor present   PT Visit Diagnosis: Other abnormalities of gait and mobility (R26.89);Pain Pain - Right/Left: Right Pain - part of body: Knee     Time: 7322-0254 PT Time Calculation (min) (ACUTE ONLY): 26 min  Charges:  $Gait Training: 8-22 mins $Therapeutic Exercise: 8-22 mins                     Faye Ramsay, PT Acute Rehabilitation  Office: [phone] Pager: [phone]

## 2020-07-14 NOTE — Progress Notes (Signed)
Shelley Clarke  MRN: [id number] DOB/Age: Oct 24, 1950 70 y.o. [facility] Procedure: Procedure(s) (LRB): TOTAL KNEE ARTHROPLASTY (Right)     Subjective: Overall progressing but slowly. Has not had BM but suffers from chronic constipation. Has also had orthostatic hypotension with therapy limiting her progress. Ambulated 60 ft yesterday but has not done stairs. Pain is a bit more manageable  Vital Signs Temp:  [97.5 F (36.4 C)-98.3 F (36.8 C)] 97.5 F (36.4 C) (07/03 0653) Pulse Rate:  [54-63] 63 (07/03 0653) Resp:  [18-20] 20 (07/03 0653) BP: (116-146)/(53-69) 146/69 (07/03 0653) SpO2:  [99 %-100 %] 100 % (07/03 0653)  Lab Results Recent Labs    07/12/20 0250 07/13/20 0325  WBC 6.5 7.3  HGB 10.0* 9.7*  HCT 30.3* 29.5*  PLT 147* 138*   BMET No results for input(s): NA, K, CL, CO2, GLUCOSE, BUN, CREATININE, CALCIUM in the last 72 hours. INR  Date Value Ref Range Status  07/03/2020 1.0 0.8 - 1.2 Final    Comment:    (NOTE) INR goal varies based on device and disease states. Performed at [facility], [address] [place], [place] [address]      Exam Knee dressing is dry Able to do SLR today NVI and mild swelling        Plan Mobilize with PT and teach stairs Work on [facility]  If able to da these two things DC home later today  [employer] PA-C  07/14/2020, 10:21 AM Contact # [phone]

## 2020-07-14 NOTE — TOC Transition Note (Addendum)
CSW notified of patient's readiness for discharge and DME needs. [facility] agreeable to provide RW and 3n1. CSW notified Cindie with [facility] of patient's discharge. Cindie agreeable to begin services upon discharge. TOC signing off.

## 2020-07-15 DIAGNOSIS — M1711 Unilateral primary osteoarthritis, right knee: Secondary | ICD-10-CM | POA: Diagnosis present

## 2020-07-15 NOTE — Progress Notes (Signed)
Physical Therapy Treatment Patient Details Name: Shelley Clarke MRN: [id number] DOB: August 12, 1950 Today's Date: 07/15/2020    History of Present Illness Patient is 70 y.o. female s/p Rt TKA on 07/10/20 with PMH significant for bipolar disorder, Tardive dyskinesia, hypothyroidism, CKDIII, anxiety & depression, chest pains. Patient reports ongoing deficits from admissionin October 2019 for acute hypoxic respiratory failure secondary to CAP which left her with difficulty mobilizing and performing ADL's.    PT Comments    Pt became dizzy this am-appears to be orthostatic hypotension-see flowsheets for readings. Will plan to have a 2nd session to see how pt does this afternoon. Pt and husband would like to d/c home today.    Follow Up Recommendations  Home health PT;Supervision/Assistance - 24 hour (pt/family decline placement)     Equipment Recommendations  Rolling walker with 5" wheels;3in1 (PT)    Recommendations for Other Services       Precautions / Restrictions Precautions Precautions: Fall Precaution Comments: pt with history for ~5 falls in last 6 months (tardive dyskinesia) Restrictions Weight Bearing Restrictions: No RLE Weight Bearing: Weight bearing as tolerated    Mobility  Bed Mobility Overal bed mobility: Needs Assistance Bed Mobility: Supine to Sit;Sit to Supine     Supine to sit: Min guard Sit to supine: Min assist   General bed mobility comments: Assist for R LE    Transfers Overall transfer level: Needs assistance Equipment used: Rolling walker (2 wheeled) Transfers: Sit to/from Stand Sit to Stand: Min assist;Min guard         General transfer comment: Cues for safety, technique, hand placement. Increased time. Min guard A except for when pt got dizzy and had to sit back down  Ambulation/Gait Ambulation/Gait assistance: Min guard Gait Distance (Feet): 60 Feet Assistive device: Rolling walker (2 wheeled) Gait Pattern/deviations: Step-to  pattern;Step-through pattern;Decreased stride length     General Gait Details: Pt began with a step through pattern but slowly transitioned to a step through pattern intermittently-depends on pain level. Cues for safety, sequencing, RW proximity, step lengths/widths.   Stairs Stairs: Yes Stairs assistance: Min assist Stair Management: Step to pattern;Forwards;With walker Number of Stairs: 1 General stair comments: up and over portable 1 step. Husband present to observe. Cues for safety, technique, sequence. Assist to steady and manage RW.   Wheelchair Mobility    Modified Rankin (Stroke Patients Only)       Balance Overall balance assessment: Needs assistance         Standing balance support: Bilateral upper extremity supported Standing balance-Leahy Scale: Poor                              Cognition Arousal/Alertness: Awake/alert Behavior During Therapy: WFL for tasks assessed/performed Overall Cognitive Status: Within Functional Limits for tasks assessed                                        Exercises Total Joint Exercises Ankle Circles/Pumps: AROM;Both;10 reps Quad Sets: AROM;Both;10 reps Straight Leg Raises: AROM;Right;10 reps Knee Flexion: AROM;Right;10 reps;Seated Goniometric ROM: ~10-85 degrees    General Comments        Pertinent Vitals/Pain Pain Assessment: 0-10 Pain Score: 7  Pain Location: Rt knee Pain Descriptors / Indicators: Discomfort;Grimacing;Guarding;Aching Pain Intervention(s): Monitored during session    Home Living  Prior Function            PT Goals (current goals can now be found in the care plan section) Progress towards PT goals: Progressing toward goals    Frequency    7X/week      PT Plan Current plan remains appropriate    Co-evaluation              AM-PAC PT "6 Clicks" Mobility   Outcome Measure  Help needed turning from your back to your side  while in a flat bed without using bedrails?: A Little Help needed moving from lying on your back to sitting on the side of a flat bed without using bedrails?: A Little Help needed moving to and from a bed to a chair (including a wheelchair)?: A Little Help needed standing up from a chair using your arms (e.g., wheelchair or bedside chair)?: A Little Help needed to walk in hospital room?: A Little Help needed climbing 3-5 steps with a railing? : A Little 6 Click Score: 18    End of Session Equipment Utilized During Treatment: Gait belt Activity Tolerance: Patient tolerated treatment well Patient left: with call bell/phone within reach;with family/visitor present   PT Visit Diagnosis: Other abnormalities of gait and mobility (R26.89);Pain Pain - Right/Left: Right Pain - part of body: Knee     Time: 2162-4469 PT Time Calculation (min) (ACUTE ONLY): 22 min  Charges:  $Gait Training: 8-22 mins              Faye Ramsay, PT Acute Rehabilitation  Office: [phone] Pager: [phone]

## 2020-07-15 NOTE — Progress Notes (Signed)
Physical Therapy Treatment Patient Details Name: Shelley Clarke MRN: [id number] DOB: [birth year] Today's Date: 07/15/2020    History of Present Illness Patient is 70 y.o. female s/p Rt TKA on 07/10/20 with PMH significant for bipolar disorder, Tardive dyskinesia, hypothyroidism, CKDIII, anxiety & depression, chest pains. Patient reports ongoing deficits from admissionin October 2019 for acute hypoxic respiratory failure secondary to CAP which left her with difficulty mobilizing and performing ADL's.    PT Comments    Pt denied dizziness this session. Pt and husband with no further concerns. Educated pt on not moving too quickly when changing positions and sit/stand for a bit to make sure she is not dizzy. All education completed. Okay to d/c from PT standpoint.     Follow Up Recommendations  Home health PT;Supervision/Assistance - 24 hour (pt/family decline placement)     Equipment Recommendations  Rolling walker with 5" wheels;3in1 (PT)    Recommendations for Other Services       Precautions / Restrictions Precautions Precautions: Fall Precaution Comments: pt with history for ~5 falls in last 6 months (tardive dyskinesia) Restrictions Weight Bearing Restrictions: No RLE Weight Bearing: Weight bearing as tolerated    Mobility  Bed Mobility Overal bed mobility: Needs Assistance Bed Mobility: Supine to Sit;Sit to Supine     Supine to sit: Min guard Sit to supine: Supervision   General bed mobility comments: Assist for R LE    Transfers Overall transfer level: Needs assistance Equipment used: Rolling walker (2 wheeled) Transfers: Sit to/from Stand Sit to Stand: Min guard         General transfer comment: Min guard for safety. Cues for safety, technique, hand placement.  Ambulation/Gait Ambulation/Gait assistance: Min guard;Min assist Gait Distance (Feet): 75 Feet Assistive device: Rolling walker (2 wheeled) Gait Pattern/deviations: Step-to pattern;Step-through  pattern;Decreased stride length     General Gait Details: Intermittent assist to steady especially during turns/negotiating obstacles. Cues for safety, RW proximity, step length/width. Pt denied dizziness.   Stairs Stairs: Yes Stairs assistance: Min assist Stair Management: Step to pattern;Forwards;With walker Number of Stairs: 1 General stair comments: up and over portable 1 step. Husband present to observe. Cues for safety, technique, sequence. Assist to steady and manage RW.   Wheelchair Mobility    Modified Rankin (Stroke Patients Only)       Balance Overall balance assessment: Needs assistance         Standing balance support: Bilateral upper extremity supported Standing balance-Leahy Scale: Poor                              Cognition Arousal/Alertness: Awake/alert Behavior During Therapy: WFL for tasks assessed/performed Overall Cognitive Status: Within Functional Limits for tasks assessed                                        Exercises     General Comments        Pertinent Vitals/Pain Pain Assessment: 0-10 Pain Score: 7  Pain Location: Rt knee Pain Descriptors / Indicators: Discomfort;Grimacing;Guarding;Aching Pain Intervention(s): Limited activity within patient's tolerance;Monitored during session;Repositioned    Home Living                      Prior Function            PT Goals (current goals can now be found in the  care plan section) Progress towards PT goals: Progressing toward goals    Frequency    7X/week      PT Plan Current plan remains appropriate    Co-evaluation              AM-PAC PT "6 Clicks" Mobility   Outcome Measure  Help needed turning from your back to your side while in a flat bed without using bedrails?: A Little Help needed moving from lying on your back to sitting on the side of a flat bed without using bedrails?: A Little Help needed moving to and from a bed to a  chair (including a wheelchair)?: A Little Help needed standing up from a chair using your arms (e.g., wheelchair or bedside chair)?: A Little Help needed to walk in hospital room?: A Little Help needed climbing 3-5 steps with a railing? : A Little 6 Click Score: 18    End of Session Equipment Utilized During Treatment: Gait belt Activity Tolerance: Patient tolerated treatment well Patient left: in bed;with call bell/phone within reach;with family/visitor present;with nursing/sitter in room   PT Visit Diagnosis: Other abnormalities of gait and mobility (R26.89);Pain Pain - Right/Left: Right Pain - part of body: Knee     Time: 6629-4765 PT Time Calculation (min) (ACUTE ONLY): 8 min  Charges:  $Gait Training: 8-22 mins                         Faye Ramsay, PT Acute Rehabilitation  Office: [phone] Pager: [phone]

## 2020-07-15 NOTE — Discharge Summary (Signed)
Physician Discharge Summary  Patient ID: Shelley Clarke MRN: [id number] DOB/AGE: [birth year] 70 y.o.  Admit date: 07/10/2020 Discharge date: 07/15/2020  Admission Diagnoses:  Osteoarthritis of right knee  Discharge Diagnoses:  Principal Problem:   Osteoarthritis of right knee Active Problems:   S/P TKR (total knee replacement) using cement   Past Medical History:  Diagnosis Date   Atypical chest pain 11/15/2017   Bipolar disorder (HCC)    Depression    Exercise-induced asthma    Family history of adverse reaction to anesthesia    Hypothyroidism    Pneumonia     Surgeries: Procedure(s): TOTAL KNEE ARTHROPLASTY on 07/10/2020   Consultants (if any):   Discharged Condition: Improved  Hospital Course: Shelley Clarke is an 70 y.o. female who was admitted 07/10/2020 with a diagnosis of Osteoarthritis of right knee and went to the operating room on 07/10/2020 and underwent the above named procedures.    She was given perioperative antibiotics:  Anti-infectives (From admission, onward)    Start     Dose/Rate Route Frequency Ordered Stop   07/10/20 2200  valACYclovir (VALTREX) tablet 500 mg        500 mg Oral Daily at bedtime 07/10/20 1258     07/10/20 1400  ceFAZolin (ANCEF) IVPB 1 g/50 mL premix        1 g 100 mL/hr over 30 Minutes Intravenous Every 6 hours 07/10/20 1258 07/10/20 2044   07/10/20 0600  ceFAZolin (ANCEF) IVPB 2g/100 mL premix        2 g 200 mL/hr over 30 Minutes Intravenous On call to O.R. 07/10/20 [id number] 07/10/20 0804     .  She was given sequential compression devices, early ambulation, and ASA for DVT prophylaxis.  She benefited maximally from the hospital stay and there were no complications.    Recent vital signs:  Vitals:   07/14/20 2152 07/15/20 0507  BP:  (!) 111/58  Pulse: 64 (!) 52  Resp:  16  Temp:  97.7 F (36.5 C)  SpO2:  95%    Recent laboratory studies:  Lab Results  Component Value Date   HGB 9.7 (L) 07/13/2020   HGB 10.0 (L)  07/12/2020   HGB 11.5 (L) 07/11/2020   Lab Results  Component Value Date   WBC 7.3 07/13/2020   PLT 138 (L) 07/13/2020   Lab Results  Component Value Date   INR 1.0 07/03/2020   Lab Results  Component Value Date   NA 140 07/11/2020   K 4.2 07/11/2020   CL 106 07/11/2020   CO2 30 07/11/2020   BUN 14 07/11/2020   CREATININE 0.98 07/11/2020   GLUCOSE 156 (H) 07/11/2020     WEIGHT BEARING   Weight bearing as tolerated with assist device (walker, cane, etc) as directed, use it as long as suggested by your surgeon or therapist, typically at least 4-6 weeks.   EXERCISES  Results after joint replacement surgery are often greatly improved when you follow the exercise, range of motion and muscle strengthening exercises prescribed by your doctor. Safety measures are also important to protect the joint from further injury. Any time any of these exercises cause you to have increased pain or swelling, decrease what you are doing until you are comfortable again and then slowly increase them. If you have problems or questions, call your caregiver or physical therapist for advice.   Rehabilitation is important following a joint replacement. After just a few days of immobilization, the muscles of the leg can become  weakened and shrink (atrophy).  These exercises are designed to build up the tone and strength of the thigh and leg muscles and to improve motion. Often times heat used for twenty to thirty minutes before working out will loosen up your tissues and help with improving the range of motion but do not use heat for the first two weeks following surgery (sometimes heat can increase post-operative swelling).   These exercises can be done on a training (exercise) mat, on the floor, on a table or on a bed. Use whatever works the best and is most comfortable for you.    Use music or television while you are exercising so that the exercises are a pleasant break in your day. This will make your life  better with the exercises acting as a break in your routine that you can look forward to.   Perform all exercises about fifteen times, three times per day or as directed.  You should exercise both the operative leg and the other leg as well.  Exercises include:   Quad Sets - Tighten up the muscle on the front of the thigh (Quad) and hold for 5-10 seconds.   Straight Leg Raises - With your knee straight (if you were given a brace, keep it on), lift the leg to 60 degrees, hold for 3 seconds, and slowly lower the leg.  Perform this exercise against resistance later as your leg gets stronger.  Leg Slides: Lying on your back, slowly slide your foot toward your buttocks, bending your knee up off the floor (only go as far as is comfortable). Then slowly slide your foot back down until your leg is flat on the floor again.  Angel Wings: Lying on your back spread your legs to the side as far apart as you can without causing discomfort.  Hamstring Strength:  Lying on your back, push your heel against the floor with your leg straight by tightening up the muscles of your buttocks.  Repeat, but this time bend your knee to a comfortable angle, and push your heel against the floor.  You may put a pillow under the heel to make it more comfortable if necessary.   A rehabilitation program following joint replacement surgery can speed recovery and prevent re-injury in the future due to weakened muscles. Contact your doctor or a physical therapist for more information on knee rehabilitation.    CONSTIPATION  Constipation is defined medically as fewer than three stools per week and severe constipation as less than one stool per week.  Even if you have a regular bowel pattern at home, your normal regimen is likely to be disrupted due to multiple reasons following surgery.  Combination of anesthesia, postoperative narcotics, change in appetite and fluid intake all can affect your bowels.   YOU MUST use at least one of the  following options; they are listed in order of increasing strength to get the job done.  They are all available over the counter, and you may need to use some, POSSIBLY even all of these options:    Drink plenty of fluids (prune juice may be helpful) and high fiber foods Colace 100 mg by mouth twice a day  Senokot for constipation as directed and as needed Dulcolax (bisacodyl), take with full glass of water  Miralax (polyethylene glycol) once or twice a day as needed.  If you have tried all these things and are unable to have a bowel movement in the first 3-4 days after surgery call either  your surgeon or your primary doctor.    If you experience loose stools or diarrhea, hold the medications until you stool forms back up.  If your symptoms do not get better within 1 week or if they get worse, check with your doctor.  If you experience "the worst abdominal pain ever" or develop nausea or vomiting, please contact the office immediately for further recommendations for treatment.   ITCHING:  If you experience itching with your medications, try taking only a single pain pill, or even half a pain pill at a time.  You can also use Benadryl over the counter for itching or also to help with sleep.   TED HOSE STOCKINGS:  Use stockings on both legs until for at least 2 weeks or as directed by physician office. They may be removed at night for sleeping.  MEDICATIONS:  See your medication summary on the "After Visit Summary" that nursing will review with you.  You may have some home medications which will be placed on hold until you complete the course of blood thinner medication.  It is important for you to complete the blood thinner medication as prescribed.  PRECAUTIONS:  If you experience chest pain or shortness of breath - call 911 immediately for transfer to the hospital emergency department.   If you develop a fever greater that 101 F, purulent drainage from wound, increased redness or drainage from  wound, foul odor from the wound/dressing, or calf pain - CONTACT YOUR SURGEON.                                                   FOLLOW-UP APPOINTMENTS:  If you do not already have a post-op appointment, please call the office for an appointment to be seen by your surgeon.  Guidelines for how soon to be seen are listed in your "After Visit Summary", but are typically between 1-4 weeks after surgery.  OTHER INSTRUCTIONS:   Knee Replacement:  Do not place pillow under knee, focus on keeping the knee straight while resting. CPM instructions: 0-90 degrees, 2 hours in the morning, 2 hours in the afternoon, and 2 hours in the evening. Place foam block, curve side up under heel at all times except when in CPM or when walking.  DO NOT modify, tear, cut, or change the foam block in any way.   MAKE SURE YOU:  Understand these instructions.  Get help right away if you are not doing well or get worse.    Thank you for letting us be a part of your medical care team.  It is a privilege we respect greatly.  We hope these instructions will help you stay on track for a fast and full recovery!   Diagnostic Studies: DG Knee 1-2 Views Right  Result Date: 07/10/2020 CLINICAL DATA:  Status post total knee replacement EXAM: RIGHT KNEE - 1-2 VIEW COMPARISON:  December 15, 2017 FINDINGS: Frontal and lateral views obtained. Patient is status post total knee replacement with prosthetic components well-seated. No fracture or dislocation. No erosion. Air is noted within the knee joint. IMPRESSION: Status post total knee replacement with prosthetic components well-seated. No fracture or dislocation. Acute postoperative changes noted. Electronically Signed   By: Bretta Bang III M.D.   On: 07/10/2020 10:50    Disposition: Discharge disposition: 01-Home or Self Care  Discharge Instructions     Call MD / Call 911   Complete by: As directed    If you experience chest pain or shortness of breath, CALL 911 and be  transported to the hospital emergency room.  If you develope a fever above 101 F, pus (white drainage) or increased drainage or redness at the wound, or calf pain, call your surgeon's office.   Constipation Prevention   Complete by: As directed    Drink plenty of fluids.  Prune juice may be helpful.  You may use a stool softener, such as Colace (over the counter) 100 mg twice a day.  Use MiraLax (over the counter) for constipation as needed.   Diet - low sodium heart healthy   Complete by: As directed    Do not put a pillow under the knee. Place it under the heel.   Complete by: As directed    Driving restrictions   Complete by: As directed    No driving for 6 weeks   Increase activity slowly as tolerated   Complete by: As directed    Lifting restrictions   Complete by: As directed    No lifting for 6 weeks   Post-operative opioid taper instructions:   Complete by: As directed    POST-OPERATIVE OPIOID TAPER INSTRUCTIONS: It is important to wean off of your opioid medication as soon as possible. If you do not need pain medication after your surgery it is ok to stop day one. Opioids include: Codeine, Hydrocodone(Norco, Vicodin), Oxycodone(Percocet, oxycontin) and hydromorphone amongst others.  Long term and even short term use of opiods can cause: Increased pain response Dependence Constipation Depression Respiratory depression And more.  Withdrawal symptoms can include Flu like symptoms Nausea, vomiting And more Techniques to manage these symptoms Hydrate well Eat regular healthy meals Stay active Use relaxation techniques(deep breathing, meditating, yoga) Do Not substitute Alcohol to help with tapering If you have been on opioids for less than two weeks and do not have pain than it is ok to stop all together.  Plan to wean off of opioids This plan should start within one week post op of your joint replacement. Maintain the same interval or time between taking each dose and  first decrease the dose.  Cut the total daily intake of opioids by one tablet each day Next start to increase the time between doses. The last dose that should be eliminated is the evening dose.      TED hose   Complete by: As directed    Use stockings (TED hose) for 2 weeks on both leg(s).  You may remove them at night for sleeping.        Follow-up Information     [facility], [facility] Follow up.   Specialty: Home Health Services Why: to provide home health physical therapy Contact information: [address] [place] [address] [phone]                  Signed: Iline Oven Malvern Kadlec 07/15/2020, 10:15 AM

## 2020-07-15 NOTE — Progress Notes (Signed)
    Subjective:  Patient reports pain as mild to moderate.  Denies N/V/CP/SOB. No c/o. Had BM last night. States she is ready to go home.  Objective:   VITALS:   Vitals:   07/14/20 1337 07/14/20 2115 07/14/20 2152 07/15/20 0507  BP: 106/66 118/64  (!) 111/58  Pulse: (!) 57 (!) 50 64 (!) 52  Resp: 17 16  16   Temp: 97.9 F (36.6 C) 97.7 F (36.5 C)  97.7 F (36.5 C)  TempSrc: Oral Oral  Oral  SpO2: 100% 99%  95%  Weight:      Height:        NAD ABD soft Sensation intact distally Intact pulses distally Dorsiflexion/Plantar flexion intact Incision: scant drainage Compartment soft   Lab Results  Component Value Date   WBC 7.3 07/13/2020   HGB 9.7 (L) 07/13/2020   HCT 29.5 (L) 07/13/2020   MCV 101.0 (H) 07/13/2020   PLT 138 (L) 07/13/2020   BMET    Component Value Date/Time   NA 140 07/11/2020 0334   K 4.2 07/11/2020 0334   CL 106 07/11/2020 0334   CO2 30 07/11/2020 0334   GLUCOSE 156 (H) 07/11/2020 0334   BUN 14 07/11/2020 0334   CREATININE 0.98 07/11/2020 0334   CALCIUM 9.1 07/11/2020 0334   GFRNONAA >60 07/11/2020 0334   GFRAA 47 (L) 11/10/2017 0742     Assessment/Plan: 5 Days Post-Op   Active Problems:   S/P TKR (total knee replacement) using cement   WBAT with walker DVT ppx: Aspirin, SCDs, TEDS PO pain control PT/OT Dispo: D/C home   11/12/2017 Somtochukwu Woollard 07/15/2020, 10:11 AM   09/15/2020, MD [phone] [facility] now [facility]  [place] [address], [address], [place], [place] Phone: [phone] [email or web address] Facebook  [phone]

## 2020-07-15 NOTE — Progress Notes (Signed)
RN reviewed discharge instructions with patient and family. All questions answered.   Paperwork given. Prescriptions electronically sent to patient pharmacy.    NT rolled patient down with all belongings to family car.     Sherrelle Prochazka, RN  

## 2020-09-21 ENCOUNTER — Other Ambulatory Visit: Payer: Self-pay | Admitting: Physician Assistant

## 2020-10-24 ENCOUNTER — Ambulatory Visit (INDEPENDENT_AMBULATORY_CARE_PROVIDER_SITE_OTHER): Payer: 59 | Admitting: Physician Assistant

## 2020-10-24 ENCOUNTER — Encounter: Payer: Self-pay | Admitting: Physician Assistant

## 2020-10-24 ENCOUNTER — Other Ambulatory Visit: Payer: Self-pay

## 2020-10-24 DIAGNOSIS — F319 Bipolar disorder, unspecified: Secondary | ICD-10-CM | POA: Diagnosis not present

## 2020-10-24 DIAGNOSIS — F422 Mixed obsessional thoughts and acts: Secondary | ICD-10-CM

## 2020-10-24 DIAGNOSIS — F5105 Insomnia due to other mental disorder: Secondary | ICD-10-CM | POA: Diagnosis not present

## 2020-10-24 DIAGNOSIS — F99 Mental disorder, not otherwise specified: Secondary | ICD-10-CM

## 2020-10-24 DIAGNOSIS — G2401 Drug induced subacute dyskinesia: Secondary | ICD-10-CM

## 2020-10-24 DIAGNOSIS — F411 Generalized anxiety disorder: Secondary | ICD-10-CM | POA: Diagnosis not present

## 2020-10-24 DIAGNOSIS — R413 Other amnesia: Secondary | ICD-10-CM

## 2020-10-24 MED ORDER — ZIPRASIDONE HCL 80 MG PO CAPS: 160.0000 mg | ORAL_CAPSULE | Freq: Every day | ORAL | 1 refills | Status: DC

## 2020-10-24 MED ORDER — ZIPRASIDONE HCL 40 MG PO CAPS: ORAL_CAPSULE | ORAL | 1 refills | Status: DC

## 2020-10-24 MED ORDER — LAMOTRIGINE 150 MG PO TABS
300.0000 mg | ORAL_TABLET | Freq: Every day | ORAL | 1 refills | Status: DC
Start: 2020-10-24 — End: 2021-04-23

## 2020-10-24 MED ORDER — FLUOXETINE HCL 40 MG PO CAPS: 80.0000 mg | ORAL_CAPSULE | Freq: Every day | ORAL | 1 refills | Status: DC

## 2020-10-24 MED ORDER — BUPROPION HCL ER (XL) 300 MG PO TB24: 300.0000 mg | ORAL_TABLET | Freq: Every day | ORAL | 1 refills | Status: DC

## 2020-10-24 MED ORDER — CLONAZEPAM 0.5 MG PO TABS: ORAL_TABLET | ORAL | 1 refills | Status: DC

## 2020-10-24 NOTE — Progress Notes (Signed)
[facility] Med Check  Patient ID: Shelley Clarke,  MRN: [id number]  PCP: Richmond Campbell., PA-C  Date of Evaluation: 10/24/2020  time spent:30 minutes  Chief Complaint:  Chief Complaint   Anxiety; Depression; Insomnia; Follow-up     HISTORY/CURRENT STATUS: HPI For routine med check, her husband [name] is with her.  Since her last visit she had knee replacement surgery.  Its been a little over 3 months now and she is doing really well.  Almost finished with PT.  She is walking without assistance, her balance is still bad and that is one of the things PT is working on.  She was having issues with that before the surgery.  She is doing well mentally.  States she is stable, able to enjoy things, energy and motivation are good.  Sleeps well.  Personal hygiene is normal.  ADLs are within normal limits.  She continues to cross-stitch some for fun, is looking forward to going to [place] in November and December when their daughter has her sixth child.  Patient denies suicidal or homicidal thoughts.  Still has anxiety but the Klonopin is very helpful.  Not having panic attacks, not overly obsessive about things like she has been in the past.  Patient denies increased energy with decreased need for sleep, no increased talkativeness, no racing thoughts, no impulsivity or risky behaviors, no increased spending, no increased libido, no grandiosity, no increased irritability or anger, and no hallucinations.  Denies dizziness, syncope, seizures, numbness, tingling, tremor, tics, unsteady gait, slurred speech, confusion. Denies muscle or joint pain, stiffness, or dystonia.  Individual Medical History/ Review of Systems: Changes? :Yes    had knee replacement on 07/10/2020  Past medications for mental health diagnoses include: Lithium, Geodon, Wellbutrin Lamictal, Prozac, Lexapro, Klonopin, Depakote, Zoloft, Xanax caused agitation, imbalance, with increased leg/arm movements, Ambien didn't  work, Luvox, Allena Earing was not helpful.  Allergies: Patient has no known allergies.  Current Medications:  Current Outpatient Medications:    albuterol (VENTOLIN HFA) 108 (90 Base) MCG/ACT inhaler, Inhale 1-2 puffs into the lungs every 6 (six) hours as needed for wheezing or shortness of breath., Disp: , Rfl:    aspirin EC 81 MG tablet, Take 1 tablet (81 mg total) by mouth 2 (two) times daily after a meal. Day after surgery, Disp: 60 tablet, Rfl: 1   b complex vitamins capsule, Take 1 capsule by mouth daily., Disp: , Rfl:    BIOTIN PO, Take 1 capsule by mouth daily., Disp: , Rfl:    diclofenac Sodium (VOLTAREN) 1 % GEL, Apply 4 g topically 4 (four) times daily., Disp: 100 g, Rfl: 0   docusate sodium (COLACE) 100 MG capsule, Take 1 capsule (100 mg total) by mouth 2 (two) times daily as needed for mild constipation., Disp: 30 capsule, Rfl: 1   levothyroxine (SYNTHROID, LEVOTHROID) 100 MCG tablet, Take 100 mcg by mouth every Sunday., Disp: , Rfl:    levothyroxine (SYNTHROID, LEVOTHROID) 112 MCG tablet, Take 112 mcg by mouth See admin instructions. Take Monday -Saturday, Disp: , Rfl:    Multiple Vitamin (MULTIVITAMIN) tablet, Take 1 tablet by mouth daily., Disp: , Rfl:    polyethylene glycol (MIRALAX / GLYCOLAX) 17 g packet, Take 17 g by mouth daily mixed in 8 ounces of liquid., Disp: 14 each, Rfl: 0   valACYclovir (VALTREX) 500 MG tablet, Take 500 mg by mouth at bedtime. , Disp: , Rfl:    buPROPion (WELLBUTRIN XL) 300 MG 24 hr tablet, Take 1 tablet (300 mg total)  by mouth daily., Disp: 90 tablet, Rfl: 1   clonazePAM (KLONOPIN) 0.5 MG tablet, 1 po at 8 am, 1 po at 12:30 pm, 2 po at 8 pm, all prn., Disp: 360 tablet, Rfl: 1   FLUoxetine (PROZAC) 40 MG capsule, Take 2 capsules (80 mg total) by mouth daily., Disp: 180 capsule, Rfl: 1   HYDROcodone-acetaminophen (NORCO) 7.5-325 MG tablet, Take 1 tablet by mouth every 4 (four) hours as needed for severe pain (pain score 7-10). (Patient not taking: Reported  on 10/24/2020), Disp: 40 tablet, Rfl: 0   lamoTRIgine (LAMICTAL) 150 MG tablet, Take 2 tablets (300 mg total) by mouth at bedtime., Disp: 180 tablet, Rfl: 1   ziprasidone (GEODON) 40 MG capsule, TAKE 1 CAPSULE EVERY MORNING AND 1 CAPSULE AT LUNCH, Disp: 180 capsule, Rfl: 1   ziprasidone (GEODON) 80 MG capsule, Take 2 capsules (160 mg total) by mouth at bedtime., Disp: 180 capsule, Rfl: 1 Medication Side Effects: none  Family Medical/ Social History: Changes?  Trip coming up to beach at Nov. Daughter is having her 6th baby in November  MENTAL HEALTH EXAM:  There were no vitals taken for this visit.There is no height or weight on file to calculate BMI.  General Appearance: Casual, Neat and Well Groomed  Eye Contact:  Good  Speech:  Clear and Coherent and Normal Rate  Volume:  Normal  Mood:  Euthymic  Affect:  Appropriate  Thought Process:  Goal Directed and Descriptions of Associations: Intact  Orientation:  Full (Time, Place, and Person)  Thought Content: Logical   Suicidal Thoughts:  No  Homicidal Thoughts:  No  Memory:  Recent;   Fair Otherwise normal for the most part.  Judgement:  Good  Insight:  Good  Psychomotor Activity:  Restlessness and still flexes her right foot a lot     Concentration:  Concentration: Good and Attention Span: Good  Recall:  Good  Fund of Knowledge: Good  Language: Good  Assets:  Desire for Improvement  ADL's:  Intact  Cognition: WNL  Prognosis:  Good   Labs 07/03/2020 Glucose 101 07/11/2020  glucose 156  06/07/2020 Labs reviewed at the last visit.  DIAGNOSES:    ICD-10-CM   1. Bipolar I disorder (HCC)  F31.9     2. Mixed obsessional thoughts and acts  F42.2     3. Generalized anxiety disorder  F41.1     4. Insomnia due to other mental disorder  F51.05    F99     5. Memory difficulties  R41.3     6. Tardive dyskinesia  G24.01         Receiving Psychotherapy: No    RECOMMENDATIONS:  PDMP was reviewed.  Last Klonopin filled  07/08/2020.  Has been prescribed hydrocodone several times since then. I provided 30 minutes of face to face time during this encounter, including time spent before and after the visit in records review, medical decision making, and charting.  I am glad to see her doing so well!  No changes in medications are necessary. We have discussed the tardive dyskinesia in the past, tried [place] but it was not helpful.  She decided she does not want to try [place]. Continue Prozac 40 mg, 2 p.o. daily. Continue Klonopin 0.5 mg, 1 p.o. at 8 AM, 1 p.o. at 12:30 PM, 2 p.o. at 8 PM, all as needed. Continue Wellbutrin XL 300 mg, 1 p.o. every morning. Continue Lamictal 150 mg, 2 p.o. nightly. Continue Geodon 40 mg, 1 p.o. every morning  and 1 p.o. at lunch. Continue Geodon 80 mg, 2 p.o. nightly. Return in 6 months.  Melony Overly, PA-C

## 2020-11-04 ENCOUNTER — Other Ambulatory Visit (HOSPITAL_COMMUNITY): Payer: Self-pay

## 2021-03-13 ENCOUNTER — Other Ambulatory Visit: Payer: Self-pay | Admitting: Family Medicine

## 2021-03-13 DIAGNOSIS — Z1231 Encounter for screening mammogram for malignant neoplasm of breast: Secondary | ICD-10-CM

## 2021-04-19 ENCOUNTER — Other Ambulatory Visit: Payer: Medicare Other | Admitting: Physician Assistant

## 2021-04-22 NOTE — Telephone Encounter (Signed)
Please call to schedule an appt. Last seen 10/13 with RTC in 6 mo. Now due.  ?

## 2021-04-22 NOTE — Telephone Encounter (Signed)
Called pt to schedule- She has a card saying she has appt 4/13 but it's not on schedule. I put her in for 4/12 ?

## 2021-04-23 ENCOUNTER — Encounter: Payer: Self-pay | Admitting: Physician Assistant

## 2021-04-23 ENCOUNTER — Ambulatory Visit (INDEPENDENT_AMBULATORY_CARE_PROVIDER_SITE_OTHER): Payer: 59 | Admitting: Physician Assistant

## 2021-04-23 DIAGNOSIS — R413 Other amnesia: Secondary | ICD-10-CM

## 2021-04-23 DIAGNOSIS — F429 Obsessive-compulsive disorder, unspecified: Secondary | ICD-10-CM | POA: Diagnosis not present

## 2021-04-23 DIAGNOSIS — F319 Bipolar disorder, unspecified: Secondary | ICD-10-CM

## 2021-04-23 DIAGNOSIS — F5105 Insomnia due to other mental disorder: Secondary | ICD-10-CM | POA: Diagnosis not present

## 2021-04-23 DIAGNOSIS — G2401 Drug induced subacute dyskinesia: Secondary | ICD-10-CM

## 2021-04-23 DIAGNOSIS — F411 Generalized anxiety disorder: Secondary | ICD-10-CM

## 2021-04-23 DIAGNOSIS — F99 Mental disorder, not otherwise specified: Secondary | ICD-10-CM

## 2021-04-23 MED ORDER — CLONAZEPAM 0.5 MG PO TABS: ORAL_TABLET | ORAL | 1 refills | Status: DC

## 2021-04-23 MED ORDER — LAMOTRIGINE 150 MG PO TABS: 300.0000 mg | ORAL_TABLET | Freq: Every day | ORAL | 1 refills | Status: DC

## 2021-04-23 MED ORDER — ZIPRASIDONE HCL 80 MG PO CAPS: 160.0000 mg | ORAL_CAPSULE | Freq: Every day | ORAL | 1 refills | Status: DC

## 2021-04-23 MED ORDER — BUPROPION HCL ER (XL) 300 MG PO TB24: 300.0000 mg | ORAL_TABLET | Freq: Every day | ORAL | 1 refills | Status: DC

## 2021-04-23 NOTE — Progress Notes (Signed)
[facility] ? ?Patient ID: Shelley Clarke,  ?MRN: [id number] ? ?PCP: Shelley Clarke., PA-C ? ?Date of Evaluation: 04/23/2021 ?time spent:30 minutes ? ?Chief Complaint:  ?Chief Complaint   ?Anxiety; Depression; Insomnia; Follow-up ?  ? ? ? ?HISTORY/CURRENT STATUS: ?HPI For routine med check, her husband Shelley Clarke is with her. ? ?States she has been feeling a little depressed, maybe 3 to 4 hours, but then specifies it might happen once a month.  She is unable to tell me when it started.  Does not feel like it is severe enough to do anything about.  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  Energy and motivation are good.  Her balance is not as good as it was when she was getting PT for that.  She has to hold onto her husband a lot when she walks.  They do walk 2 miles 3 days a week and she is able to manage that although the last mile is difficult.  Does not cry easily.  ADLs and personal hygiene are normal.  Not isolating.  Is going to a Bible study on Wednesday nights and then a ladies study once a month.  No suicidal or homicidal thoughts. ? ?Still has anxiety but the Klonopin is very helpful.  Not having panic attacks, not overly obsessive about things like she has been in the past. ? ?Patient denies increased energy with decreased need for sleep, no increased talkativeness, no racing thoughts, no impulsivity or risky behaviors, no increased spending, no increased libido, no grandiosity, no increased irritability or anger, no paranoia, and no hallucinations. ? ?Denies dizziness, syncope, seizures, numbness, tingling, tremor, tics, unsteady gait, slurred speech, confusion. Denies muscle or joint pain, stiffness, or dystonia. ? ?Individual Medical History/ Review of Systems: Changes? :No     ? ?Past medications for mental health diagnoses include: ?Lithium, Geodon, Wellbutrin Lamictal, Prozac, Lexapro, Klonopin, Depakote, Zoloft, Xanax caused agitation, imbalance, with increased leg/arm  movements, Ambien didn't work, Luvox, Julio Alm was not helpful. ? ?Allergies: Patient has no known allergies. ? ?Current Medications:  ?Current Outpatient Medications:  ?  albuterol (VENTOLIN HFA) 108 (90 Base) MCG/ACT inhaler, Inhale 1-2 puffs into the lungs every 6 (six) hours as needed for wheezing or shortness of breath., Disp: , Rfl:  ?  aspirin EC 81 MG tablet, Take 1 tablet (81 mg total) by mouth 2 (two) times daily after a meal. Day after surgery, Disp: 60 tablet, Rfl: 1 ?  b complex vitamins capsule, Take 1 capsule by mouth daily., Disp: , Rfl:  ?  BIOTIN PO, Take 1 capsule by mouth daily., Disp: , Rfl:  ?  diclofenac Sodium (VOLTAREN) 1 % GEL, Apply 4 g topically 4 (four) times daily., Disp: 100 g, Rfl: 0 ?  docusate sodium (COLACE) 100 MG capsule, Take 1 capsule (100 mg total) by mouth 2 (two) times daily as needed for mild constipation., Disp: 30 capsule, Rfl: 1 ?  FLUoxetine (PROZAC) 40 MG capsule, TAKE 2 CAPSULES BY MOUTH DAILY, Disp: 180 capsule, Rfl: 1 ?  levothyroxine (SYNTHROID, LEVOTHROID) 100 MCG tablet, Take 100 mcg by mouth every Sunday., Disp: , Rfl:  ?  levothyroxine (SYNTHROID, LEVOTHROID) 112 MCG tablet, Take 112 mcg by mouth See admin instructions. Take Monday -Saturday, Disp: , Rfl:  ?  Multiple Vitamin (MULTIVITAMIN) tablet, Take 1 tablet by mouth daily., Disp: , Rfl:  ?  polyethylene glycol (MIRALAX / GLYCOLAX) 17 g packet, Take 17 g by mouth daily mixed in  8 ounces of liquid., Disp: 14 each, Rfl: 0 ?  valACYclovir (VALTREX) 500 MG tablet, Take 500 mg by mouth at bedtime. , Disp: , Rfl:  ?  ziprasidone (GEODON) 40 MG capsule, TAKE 1 CAPSULE EVERY MORNING AND 1 CAPSULE AT LUNCH, Disp: 180 capsule, Rfl: 1 ?  buPROPion (WELLBUTRIN XL) 300 MG 24 hr tablet, Take 1 tablet (300 mg total) by mouth daily., Disp: 90 tablet, Rfl: 1 ?  clonazePAM (KLONOPIN) 0.5 MG tablet, 1 po at 8 am, 1 po at 12:30 pm, 2 po at 8 pm, all prn., Disp: 360 tablet, Rfl: 1 ?  HYDROcodone-acetaminophen (NORCO) 7.5-325 MG  tablet, Take 1 tablet by mouth every 4 (four) hours as needed for severe pain (pain score 7-10). (Patient not taking: Reported on 10/24/2020), Disp: 40 tablet, Rfl: 0 ?  lamoTRIgine (LAMICTAL) 150 MG tablet, Take 2 tablets (300 mg total) by mouth at bedtime., Disp: 180 tablet, Rfl: 1 ?  ziprasidone (GEODON) 80 MG capsule, Take 2 capsules (160 mg total) by mouth at bedtime., Disp: 180 capsule, Rfl: 1 ?Medication Side Effects: none ? ?Family Medical/ Social History: Changes? Her brother-in-law died recently, very suddenly.  Other than that social history is the same.   ? ?MENTAL HEALTH EXAM: ? ?There were no vitals taken for this visit.There is no height or weight on file to calculate BMI.  ?General Appearance: Casual, Neat and Well Groomed  ?Eye Contact:  Good  ?Speech:  Clear and Coherent and Normal Rate  ?Volume:  Normal  ?Mood:  Euthymic  ?Affect:  Appropriate  ?Thought Process:  Goal Directed and Descriptions of Associations: Intact  ?Orientation:  Full (Time, Place, and Person)  ?Thought Content: Logical   ?Suicidal Thoughts:  No  ?Homicidal Thoughts:  No  ?Memory:  Recent;   Fair ?Otherwise normal for the most part.  ?Judgement:  Good  ?Insight:  Good  ?Psychomotor Activity:   Flexes and extends her right foot often, no abnormal mouth movements     ?Concentration:  Concentration: Good and Attention Span: Good  ?Recall:  Good  ?Fund of Knowledge: Good  ?Language: Good  ?Assets:  Desire for Improvement  ?ADL's:  Intact  ?Cognition: WNL  ?Prognosis:  Good  ? ?Her primary provider follows labs including glucose, lipids, and EKG annually. ? ?DIAGNOSES:  ?  ICD-10-CM   ?1. Bipolar I disorder ([place])  F31.9   ?  ?2. Obsessive-compulsive disorder, unspecified type  F42.9   ?  ?3. Generalized anxiety disorder  F41.1   ?  ?4. Insomnia due to other mental disorder  F51.05   ? F99   ?  ?5. Memory difficulties  R41.3   ?  ?6. Tardive dyskinesia  G24.01   ?  ? ? ? ?Receiving Psychotherapy: No  ? ? ?RECOMMENDATIONS:  ?PDMP was  reviewed.  Last Klonopin filled 01/27/2021.  Given  Valium #8 on 03/11/2021. ?I provided 30 minutes of face to face time during this encounter, including time spent before and after the visit in records review, medical decision making, counseling pertinent to today's visit, and charting.  ?My condolences for the death in her family. ?We have discussed the tardive dyskinesia in the past, tried [place] but it was not helpful.  She decided she does not want to try [place]. ?If the depression worsens, happens more often and does not go away quickly then she will let me know. ?No change in medication. ? ? ?Continue Wellbutrin XL 300 mg, 1 p.o. every morning. ?Continue Klonopin 0.5 mg,  1 p.o. at 8 AM, 1 p.o. at 12:30 PM, 2 p.o. at 8 PM, all as needed. ?Continue Prozac 40 mg, 2 p.o. daily. ?Continue Lamictal 150 mg, 2 p.o. nightly. ?Continue Geodon 40 mg, 1 p.o. every morning and 1 p.o. at lunch. ?Continue Geodon 80 mg, 2 p.o. nightly. ?Return in 6 months. ? ?Donnal Moat, PA-C  ?

## 2021-04-24 ENCOUNTER — Ambulatory Visit: Payer: 59

## 2021-05-02 ENCOUNTER — Ambulatory Visit
Admission: RE | Admit: 2021-05-02 | Discharge: 2021-05-02 | Disposition: A | Discharge status: Home / Self Care | Payer: 59 | Source: Ambulatory Visit | Attending: Family Medicine | Admitting: Family Medicine

## 2021-05-02 DIAGNOSIS — Z1231 Encounter for screening mammogram for malignant neoplasm of breast: Secondary | ICD-10-CM

## 2021-05-20 ENCOUNTER — Ambulatory Visit: Payer: 59

## 2021-05-27 ENCOUNTER — Ambulatory Visit: Payer: 59

## 2021-07-02 ENCOUNTER — Other Ambulatory Visit: Payer: Self-pay

## 2021-07-02 ENCOUNTER — Emergency Department (HOSPITAL_BASED_OUTPATIENT_CLINIC_OR_DEPARTMENT_OTHER)
Admission: EM | Admit: 2021-07-02 | Discharge: 2021-07-02 | Disposition: A | Discharge status: Home / Self Care | Payer: 59 | Attending: Emergency Medicine | Admitting: Emergency Medicine

## 2021-07-02 ENCOUNTER — Emergency Department (HOSPITAL_BASED_OUTPATIENT_CLINIC_OR_DEPARTMENT_OTHER): Payer: 59 | Admitting: Radiology

## 2021-07-02 ENCOUNTER — Emergency Department (HOSPITAL_BASED_OUTPATIENT_CLINIC_OR_DEPARTMENT_OTHER): Payer: 59

## 2021-07-02 ENCOUNTER — Encounter (HOSPITAL_BASED_OUTPATIENT_CLINIC_OR_DEPARTMENT_OTHER): Payer: Self-pay

## 2021-07-02 DIAGNOSIS — Z79899 Other long term (current) drug therapy: Secondary | ICD-10-CM | POA: Diagnosis not present

## 2021-07-02 DIAGNOSIS — S0990XA Unspecified injury of head, initial encounter: Secondary | ICD-10-CM | POA: Diagnosis present

## 2021-07-02 DIAGNOSIS — M546 Pain in thoracic spine: Secondary | ICD-10-CM | POA: Diagnosis not present

## 2021-07-02 DIAGNOSIS — W01198A Fall on same level from slipping, tripping and stumbling with subsequent striking against other object, initial encounter: Secondary | ICD-10-CM | POA: Insufficient documentation

## 2021-07-02 DIAGNOSIS — Y92003 Bedroom of unspecified non-institutional (private) residence as the place of occurrence of the external cause: Secondary | ICD-10-CM | POA: Diagnosis not present

## 2021-07-02 DIAGNOSIS — Y9301 Activity, walking, marching and hiking: Secondary | ICD-10-CM | POA: Diagnosis not present

## 2021-07-02 DIAGNOSIS — S31119A Laceration without foreign body of abdominal wall, unspecified quadrant without penetration into peritoneal cavity, initial encounter: Secondary | ICD-10-CM | POA: Insufficient documentation

## 2021-07-02 DIAGNOSIS — M545 Low back pain, unspecified: Secondary | ICD-10-CM | POA: Insufficient documentation

## 2021-07-02 DIAGNOSIS — R58 Hemorrhage, not elsewhere classified: Secondary | ICD-10-CM

## 2021-07-02 DIAGNOSIS — R251 Tremor, unspecified: Secondary | ICD-10-CM | POA: Diagnosis not present

## 2021-07-02 DIAGNOSIS — Z7982 Long term (current) use of aspirin: Secondary | ICD-10-CM | POA: Diagnosis not present

## 2021-07-02 DIAGNOSIS — S098XXA Other specified injuries of head, initial encounter: Secondary | ICD-10-CM

## 2021-07-02 DIAGNOSIS — S51812A Laceration without foreign body of left forearm, initial encounter: Secondary | ICD-10-CM | POA: Diagnosis not present

## 2021-07-02 DIAGNOSIS — W19XXXA Unspecified fall, initial encounter: Secondary | ICD-10-CM | POA: Diagnosis not present

## 2021-07-02 MED ORDER — CYCLOBENZAPRINE HCL 5 MG PO TABS
5.0000 mg | ORAL_TABLET | Freq: Once | ORAL | Status: AC
  Administered 2021-07-02: 5 mg via ORAL
  Filled 2021-07-02: qty 1

## 2021-07-02 MED ORDER — ACETAMINOPHEN 325 MG PO TABS
650.0000 mg | ORAL_TABLET | Freq: Once | ORAL | Status: AC
Start: 2021-07-02 — End: 2021-07-02
  Administered 2021-07-02: 650 mg via ORAL
  Filled 2021-07-02: qty 2

## 2021-07-02 NOTE — ED Triage Notes (Signed)
Fell last night.  States lost balance and fell hitting head on wall.  C/O pain to left side and back.  Noted abrasion to left side of back.  Denies LOC.  States has headache and vision disturbances. Not on blood thinners

## 2021-07-02 NOTE — ED Provider Notes (Signed)
[facility] EMERGENCY DEPT Provider Note   CSN: [id number] Arrival date & time: 07/02/21  4098     History  Chief Complaint  Patient presents with   Marletta Lor    Shelley Clarke is a 71 y.o. female.  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  Patient states she was walking in her bedroom when she lost her balance and fell hitting the left side of her head.  Patient admits to headache, neck stiffness, and left-sided back pain and bruising.  Patient also has bruising and pain of left forearm.  Nuys any bleeding.  Denies any blood thinner use.  Denies any LOC.  The history is provided by the patient. No language interpreter was used.  Fall Associated symptoms include headaches. Pertinent negatives include no chest pain, no abdominal pain and no shortness of breath.       Home Medications Prior to Admission medications   Medication Sig Start Date End Date Taking? Authorizing Provider  albuterol (VENTOLIN HFA) 108 (90 Base) MCG/ACT inhaler Inhale 1-2 puffs into the lungs every 6 (six) hours as needed for wheezing or shortness of breath.    [provider]  aspirin EC 81 MG tablet Take 1 tablet (81 mg total) by mouth 2 (two) times daily after a meal. Day after surgery 07/10/20   Jene Every, MD  b complex vitamins capsule Take 1 capsule by mouth daily.    [provider]  BIOTIN PO Take 1 capsule by mouth daily.    [provider]  buPROPion (WELLBUTRIN XL) 300 MG 24 hr tablet Take 1 tablet (300 mg total) by mouth daily. 04/23/21   Melony Overly T, PA-C  clonazePAM (KLONOPIN) 0.5 MG tablet 1 po at 8 am, 1 po at 12:30 pm, 2 po at 8 pm, all prn. 04/23/21   Claybon Jabs, Rosey Bath T, PA-C  diclofenac Sodium (VOLTAREN) 1 % GEL Apply 4 g topically 4 (four) times daily. 11/23/19   Melene Plan, DO  docusate sodium (COLACE) 100 MG capsule Take 1 capsule (100 mg  total) by mouth 2 (two) times daily as needed for mild constipation. 07/10/20   Jene Every, MD  FLUoxetine (PROZAC) 40 MG capsule TAKE 2 CAPSULES BY MOUTH DAILY 04/22/21   Claybon Jabs Glade Nurse, PA-C  HYDROcodone-acetaminophen (NORCO) 7.5-325 MG tablet Take 1 tablet by mouth every 4 (four) hours as needed for severe pain (pain score 7-10). Patient not taking: Reported on 10/24/2020 07/12/20   Dorothy Spark, PA-C  lamoTRIgine (LAMICTAL) 150 MG tablet Take 2 tablets (300 mg total) by mouth at bedtime. 04/23/21   Melony Overly T, PA-C  levothyroxine (SYNTHROID, LEVOTHROID) 100 MCG tablet Take 100 mcg by mouth every Sunday.    [provider]  levothyroxine (SYNTHROID, LEVOTHROID) 112 MCG tablet Take 112 mcg by mouth See admin instructions. Take Monday -Saturday    [provider]  Multiple Vitamin (MULTIVITAMIN) tablet Take 1 tablet by mouth daily.    [provider]  polyethylene glycol (MIRALAX / GLYCOLAX) 17 g packet Take 17 g by mouth daily mixed in 8 ounces of liquid. 07/10/20   Jene Every, MD  valACYclovir (VALTREX) 500 MG tablet Take 500 mg by mouth at bedtime.     [provider]  ziprasidone (GEODON) 40 MG capsule TAKE 1 CAPSULE EVERY MORNING AND 1 CAPSULE AT LUNCH 04/22/21   Hurst, Teresa T, PA-C  ziprasidone (GEODON) 80 MG capsule Take 2  capsules (160 mg total) by mouth at bedtime. 04/23/21   Cherie Ouch, PA-C      Allergies    Patient has no known allergies.    Review of Systems   Review of Systems  Constitutional:  Negative for chills and fever.  HENT:  Negative for ear pain and sore throat.   Eyes:  Negative for pain and visual disturbance.  Respiratory:  Negative for cough and shortness of breath.   Cardiovascular:  Negative for chest pain and palpitations.  Gastrointestinal:  Negative for abdominal pain and vomiting.  Genitourinary:  Negative for dysuria and hematuria.  Musculoskeletal:  Positive for back pain and neck pain. Negative for  arthralgias.  Skin:  Negative for color change and rash.  Neurological:  Positive for headaches. Negative for seizures and syncope.  All other systems reviewed and are negative.   Physical Exam Updated Vital Signs BP (!) 132/42   Pulse (!) 56   Temp 98.6 F (37 C)   Resp 15   Ht 5\' 4"  (1.626 m)   Wt 59.9 kg   SpO2 96%   BMI 22.66 kg/m  Physical Exam Vitals and nursing note reviewed.  Constitutional:      General: She is not in acute distress.    Appearance: She is well-developed.  HENT:     Head: Normocephalic and atraumatic.  Eyes:     Conjunctiva/sclera: Conjunctivae normal.  Cardiovascular:     Rate and Rhythm: Normal rate and regular rhythm.     Pulses:          Radial pulses are 2+ on the right side and 2+ on the left side.       Dorsalis pedis pulses are 2+ on the right side and 2+ on the left side.     Heart sounds: No murmur heard. Pulmonary:     Effort: Pulmonary effort is normal. No respiratory distress.     Breath sounds: Normal breath sounds.  Abdominal:     Palpations: Abdomen is soft.     Tenderness: There is no abdominal tenderness.  Musculoskeletal:        General: No swelling.     Right shoulder: Normal.     Left shoulder: Normal.     Right upper arm: Normal.     Left upper arm: Normal.     Right elbow: Normal.     Left elbow: Normal.     Left forearm: Tenderness (And ecchymosis to left posterior forearm) present.     Right wrist: Normal.     Left wrist: Normal.     Right hand: Normal.     Left hand: Normal.     Cervical back: Neck supple. Tenderness present. No bony tenderness.     Thoracic back: Normal.     Lumbar back: Tenderness present. No bony tenderness.       Back:     Right hip: Normal.     Left hip: Normal.     Right upper leg: Normal.     Left upper leg: Normal.     Right knee: Normal.     Left knee: Normal.     Right lower leg: Normal.     Left lower leg: Normal.     Right ankle: Normal.     Left ankle: Normal.      Comments: Tenderness to palpation of left flank with ecchymosis present  Skin:    General: Skin is warm and dry.     Capillary Refill: Capillary refill  takes less than 2 seconds.  Neurological:     General: No focal deficit present.     Mental Status: She is alert and oriented to person, place, and time.     GCS: GCS eye subscore is 4. GCS verbal subscore is 5. GCS motor subscore is 6.     Cranial Nerves: Cranial nerves 2-12 are intact.     Sensory: Sensation is intact.     Motor: Tremor present.     Coordination: Coordination is intact.  Psychiatric:        Mood and Affect: Mood normal.     ED Results / Procedures / Treatments   Labs (all labs ordered are listed, but only abnormal results are displayed) Labs Reviewed - No data to display  EKG None  Radiology DG Chest Portable 1 View  Result Date: 07/02/2021 CLINICAL DATA:  Fall EXAM: PORTABLE CHEST 1 VIEW COMPARISON:  11/23/2019 FINDINGS: Cardiomegaly. Both lungs are clear. The visualized skeletal structures are unremarkable. IMPRESSION: Cardiomegaly without acute abnormality of the lungs in frontal portable projection. Electronically Signed   By: Jearld Lesch M.D.   On: 07/02/2021 10:31   DG Forearm Left  Result Date: 07/02/2021 CLINICAL DATA:  Fall with bruising and pain to left forearm EXAM: LEFT FOREARM - 2 VIEW COMPARISON:  None Available. FINDINGS: There is no acute fracture or dislocation. Bony alignment is normal. The soft tissues are unremarkable. IMPRESSION: No acute fracture or dislocation. Electronically Signed   By: Lesia Hausen M.D.   On: 07/02/2021 10:28   CT Head Wo Contrast  Result Date: 07/02/2021 CLINICAL DATA:  Head trauma, minor (Age >= 65y); Neck trauma (Age >= 65y) EXAM: CT HEAD WITHOUT CONTRAST CT CERVICAL SPINE WITHOUT CONTRAST TECHNIQUE: Multidetector CT imaging of the head and cervical spine was performed following the standard protocol without intravenous contrast. Multiplanar CT image reconstructions  of the cervical spine were also generated. RADIATION DOSE REDUCTION: This exam was performed according to the departmental dose-optimization program which includes automated exposure control, adjustment of the mA and/or kV according to patient size and/or use of iterative reconstruction technique. COMPARISON:  2021 FINDINGS: CT HEAD FINDINGS Brain: There is no acute intracranial hemorrhage, mass effect, or edema. Kiri Hinderliter-white differentiation is preserved. There is no extra-axial fluid collection. Ventricles and sulci are stable in size and configuration. Vascular: No hyperdense vessel or unexpected calcification. Skull: Calvarium is unremarkable. Sinuses/Orbits: No acute finding. Other: None. CT CERVICAL SPINE FINDINGS Alignment: Stable with minor degenerative listhesis. Skull base and vertebrae: Vertebral body heights are similar with degenerative plate irregularity. No acute fracture. Soft tissues and spinal canal: No prevertebral fluid or swelling. No visible canal hematoma. Disc levels: Multilevel degenerative changes are present including disc space narrowing, endplate osteophytes, and facet and uncovertebral hypertrophy. Appearance is similar to prior study. There is foraminal greater than canal narrowing. Upper chest: No apical lung mass. Other: None. IMPRESSION: No evidence of acute intracranial injury. No acute cervical spine fracture. Electronically Signed   By: Guadlupe Spanish M.D.   On: 07/02/2021 10:28   CT Cervical Spine Wo Contrast  Result Date: 07/02/2021 CLINICAL DATA:  Head trauma, minor (Age >= 65y); Neck trauma (Age >= 65y) EXAM: CT HEAD WITHOUT CONTRAST CT CERVICAL SPINE WITHOUT CONTRAST TECHNIQUE: Multidetector CT imaging of the head and cervical spine was performed following the standard protocol without intravenous contrast. Multiplanar CT image reconstructions of the cervical spine were also generated. RADIATION DOSE REDUCTION: This exam was performed according to the departmental  dose-optimization program which  includes automated exposure control, adjustment of the mA and/or kV according to patient size and/or use of iterative reconstruction technique. COMPARISON:  2021 FINDINGS: CT HEAD FINDINGS Brain: There is no acute intracranial hemorrhage, mass effect, or edema. Saul Fabiano-white differentiation is preserved. There is no extra-axial fluid collection. Ventricles and sulci are stable in size and configuration. Vascular: No hyperdense vessel or unexpected calcification. Skull: Calvarium is unremarkable. Sinuses/Orbits: No acute finding. Other: None. CT CERVICAL SPINE FINDINGS Alignment: Stable with minor degenerative listhesis. Skull base and vertebrae: Vertebral body heights are similar with degenerative plate irregularity. No acute fracture. Soft tissues and spinal canal: No prevertebral fluid or swelling. No visible canal hematoma. Disc levels: Multilevel degenerative changes are present including disc space narrowing, endplate osteophytes, and facet and uncovertebral hypertrophy. Appearance is similar to prior study. There is foraminal greater than canal narrowing. Upper chest: No apical lung mass. Other: None. IMPRESSION: No evidence of acute intracranial injury. No acute cervical spine fracture. Electronically Signed   By: Guadlupe Spanish M.D.   On: 07/02/2021 10:28    Procedures Procedures    Medications Ordered in ED Medications  acetaminophen (TYLENOL) tablet 650 mg (650 mg Oral Given 07/02/21 1019)    ED Course/ Medical Decision Making/ A&P                           Medical Decision Making Amount and/or Complexity of Data Reviewed Radiology: ordered.  Risk OTC drugs.   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  Patient is alert and oriented x3, no acute distress, afebrile, stable vital signs.  Physical exam demonstrates no neurovascular deficits.  Ecchymosis to  left flank and left posterior forearm present.  Otherwise no spinal tenderness.  Tylenol given for pain.  CT head head recommended by [place] head trauma CT rule and Nexus C-spine rule.  CT head and neck demonstrates no acute process.  X-rays of left forearm demonstrates no fractures.  Chest x-ray stable.  FAST exam at bedside demonstrates no free fluid.    Patient is otherwise well-appearing and safe to return home at this time.  Patient recommended for Motrin and Tylenol.  Patient has prescription for also relaxers for chronic right shoulder blade pain that can also add benefit to symptoms.  Patient in no distress and overall condition improved here in the ED. Detailed discussions were had with the patient regarding current findings, and need for close f/u with PCP or on call doctor. The patient has been instructed to return immediately if the symptoms worsen in any way for re-evaluation. Patient verbalized understanding and is in agreement with current care plan. All questions answered prior to discharge.         Final Clinical Impression(s) / ED Diagnoses Final diagnoses:  Fall, initial encounter  Blunt head trauma, initial encounter  Ecchymosis-forearm and flank    Rx / DC Orders ED Discharge Orders     None         Franne Forts, DO 07/02/21 1041

## 2021-10-18 ENCOUNTER — Other Ambulatory Visit: Payer: Self-pay | Admitting: Physician Assistant

## 2021-10-21 NOTE — Telephone Encounter (Signed)
Geodon filled 7/8 appt 10/17

## 2021-10-22 ENCOUNTER — Other Ambulatory Visit: Payer: Self-pay

## 2021-10-22 ENCOUNTER — Emergency Department (HOSPITAL_BASED_OUTPATIENT_CLINIC_OR_DEPARTMENT_OTHER): Payer: 59

## 2021-10-22 ENCOUNTER — Emergency Department (HOSPITAL_BASED_OUTPATIENT_CLINIC_OR_DEPARTMENT_OTHER)
Admission: EM | Admit: 2021-10-22 | Discharge: 2021-10-22 | Disposition: A | Discharge status: Home / Self Care | Payer: 59 | Attending: Emergency Medicine | Admitting: Emergency Medicine

## 2021-10-22 ENCOUNTER — Other Ambulatory Visit (HOSPITAL_BASED_OUTPATIENT_CLINIC_OR_DEPARTMENT_OTHER): Payer: Self-pay

## 2021-10-22 ENCOUNTER — Encounter (HOSPITAL_BASED_OUTPATIENT_CLINIC_OR_DEPARTMENT_OTHER): Payer: Self-pay

## 2021-10-22 DIAGNOSIS — W228XXA Striking against or struck by other objects, initial encounter: Secondary | ICD-10-CM | POA: Diagnosis not present

## 2021-10-22 DIAGNOSIS — S0990XA Unspecified injury of head, initial encounter: Secondary | ICD-10-CM

## 2021-10-22 DIAGNOSIS — Z7982 Long term (current) use of aspirin: Secondary | ICD-10-CM | POA: Insufficient documentation

## 2021-10-22 DIAGNOSIS — S060X0A Concussion without loss of consciousness, initial encounter: Secondary | ICD-10-CM | POA: Diagnosis not present

## 2021-10-22 DIAGNOSIS — R110 Nausea: Secondary | ICD-10-CM | POA: Insufficient documentation

## 2021-10-22 DIAGNOSIS — R519 Headache, unspecified: Secondary | ICD-10-CM | POA: Insufficient documentation

## 2021-10-22 MED ORDER — ONDANSETRON 4 MG PO TBDP
ORAL_TABLET | ORAL | 0 refills | Status: DC
  Filled 2021-10-22: qty 20, 4d supply, fill #0

## 2021-10-22 MED ORDER — BUTALBITAL-APAP-CAFFEINE 50-325-40 MG PO TABS
1.0000 | ORAL_TABLET | Freq: Four times a day (QID) | ORAL | 0 refills | Status: DC | PRN
  Filled 2021-10-22: qty 4, 1d supply, fill #0
  Filled 2021-10-22: qty 1, 1d supply, fill #0

## 2021-10-22 NOTE — ED Notes (Signed)
Patient called x 2 with no response.  All areas of lobby checked.

## 2021-10-22 NOTE — ED Notes (Signed)
States she has lost her balance and fallen twice in 2 weeks. Woke up today with a  HA to top of her head and nausea. No neuro deficits noted.

## 2021-10-22 NOTE — ED Triage Notes (Signed)
Patient here POV from Home.  Endorses recent Fall 2 Days ago where she leaned forward to pick up something and fell to her side and possibly hit her head on a shelf.   Had a Fall the Week Prior to Monday with similar mechanism and was evaluated for Same.   No LOC. No Anticoagulants. Some Nausea and Intermittent Headache.   NAD Noted during Triage. A&Ox4. GCS 15. Ambulatory.

## 2021-10-22 NOTE — Discharge Instructions (Addendum)
Please call your family doctor so they can try and see you in the office.  If you realize there is an activity that you are doing that is making her headache much worse than try to stop as best you can.  I prescribed you a medication that can help with headaches.  I have also prescribed you some nausea medicine.  Please return to the emergency department for worsening symptoms one-sided numbness or weakness or difficulty speech or swallowing.

## 2021-10-22 NOTE — ED Notes (Signed)
Pt discharged to home. Discharge instructions have been discussed with patient and/or family members. Pt verbally acknowledges understanding d/c instructions, and endorses comprehension to checkout at registration before leaving.  °

## 2021-10-22 NOTE — ED Provider Notes (Signed)
[facility] EMERGENCY DEPT Provider Note   CSN: [id number] Arrival date & time: 10/22/21  1107     History  Chief Complaint  Patient presents with   Marletta Lor    Shelley Clarke is a 71 y.o. female.  71 yo F with a chief complaints of a fall.  This actually happened a few days ago.  The patient since then has had a progressive headache and has had some nausea.  Feels like a pressure behind her eyes.  She fell a couple weeks previously and then suffered a head injury and was told she had a concussion.  She thinks that she lost her balance.  She adamantly denies passing out.  Denies chest pain difficulty breathing abdominal pain.   Fall       Home Medications Prior to Admission medications   Medication Sig Start Date End Date Taking? Authorizing Provider  butalbital-acetaminophen-caffeine (FIORICET) 50-325-40 MG tablet Take 1-2 tablets by mouth every 6 (six) hours as needed for headache. 10/22/21 10/22/22 Yes Melene Plan, DO  ondansetron (ZOFRAN-ODT) 4 MG disintegrating tablet Dissolve 1 tablet under the tongue every 4 hours as needed for nausea/vomiting 10/22/21  Yes Melene Plan, DO  albuterol (VENTOLIN HFA) 108 (90 Base) MCG/ACT inhaler Inhale 1-2 puffs into the lungs every 6 (six) hours as needed for wheezing or shortness of breath.    [provider]  aspirin EC 81 MG tablet Take 1 tablet (81 mg total) by mouth 2 (two) times daily after a meal. Day after surgery 07/10/20   Jene Every, MD  b complex vitamins capsule Take 1 capsule by mouth daily.    [provider]  BIOTIN PO Take 1 capsule by mouth daily.    [provider]  buPROPion (WELLBUTRIN XL) 300 MG 24 hr tablet Take 1 tablet (300 mg total) by mouth daily. 04/23/21   Melony Overly T, PA-C  clonazePAM (KLONOPIN) 0.5 MG tablet 1 po at 8 am, 1 po at 12:30 pm, 2 po at 8 pm, all prn. 04/23/21   Claybon Jabs, Rosey Bath T, PA-C  diclofenac Sodium (VOLTAREN) 1 % GEL Apply 4 g topically 4 (four) times  daily. 11/23/19   Melene Plan, DO  docusate sodium (COLACE) 100 MG capsule Take 1 capsule (100 mg total) by mouth 2 (two) times daily as needed for mild constipation. 07/10/20   Jene Every, MD  FLUoxetine (PROZAC) 40 MG capsule TAKE 2 CAPSULES BY MOUTH EVERY DAY 10/21/21   White, Watt Climes, NP  HYDROcodone-acetaminophen (NORCO) 7.5-325 MG tablet Take 1 tablet by mouth every 4 (four) hours as needed for severe pain (pain score 7-10). Patient not taking: Reported on 10/24/2020 07/12/20   Dorothy Spark, PA-C  lamoTRIgine (LAMICTAL) 150 MG tablet Take 2 tablets (300 mg total) by mouth at bedtime. 04/23/21   Melony Overly T, PA-C  levothyroxine (SYNTHROID, LEVOTHROID) 100 MCG tablet Take 100 mcg by mouth every Sunday.    [provider]  levothyroxine (SYNTHROID, LEVOTHROID) 112 MCG tablet Take 112 mcg by mouth See admin instructions. Take Monday -Saturday    [provider]  Multiple Vitamin (MULTIVITAMIN) tablet Take 1 tablet by mouth daily.    [provider]  polyethylene glycol (MIRALAX / GLYCOLAX) 17 g packet Take 17 g by mouth daily mixed in 8 ounces of liquid. 07/10/20   Jene Every, MD  valACYclovir (VALTREX) 500 MG tablet Take 500 mg by mouth at bedtime.     [provider]  ziprasidone (GEODON) 40 MG capsule TAKE  1 CAPSULE EVERY MORNING AND 1 CAPSULE AT LUNCH 10/21/21   Lesle Chris A, NP  ziprasidone (GEODON) 80 MG capsule Take 2 capsules (160 mg total) by mouth at bedtime. 04/23/21   Addison Lank, PA-C      Allergies    Patient has no known allergies.    Review of Systems   Review of Systems  Physical Exam Updated Vital Signs BP (!) 149/66 (BP Location: Right Arm)   Pulse 68   Temp 98.6 F (37 C) (Oral)   Resp 14   Ht 5\' 4"  (1.626 m)   Wt 59.9 kg   SpO2 100%   BMI 22.67 kg/m  Physical Exam Vitals and nursing note reviewed.  Constitutional:      General: She is not in acute distress.    Appearance: She is well-developed. She is not  diaphoretic.  HENT:     Head: Normocephalic and atraumatic.  Eyes:     Pupils: Pupils are equal, round, and reactive to light.  Cardiovascular:     Rate and Rhythm: Normal rate and regular rhythm.     Heart sounds: No murmur heard.    No friction rub. No gallop.  Pulmonary:     Effort: Pulmonary effort is normal.     Breath sounds: No wheezing or rales.  Abdominal:     General: There is no distension.     Palpations: Abdomen is soft.     Tenderness: There is no abdominal tenderness.  Musculoskeletal:        General: No tenderness.     Cervical back: Normal range of motion and neck supple.  Skin:    General: Skin is warm and dry.  Neurological:     Mental Status: She is alert and oriented to person, place, and time.  Psychiatric:        Behavior: Behavior normal.     ED Results / Procedures / Treatments   Labs (all labs ordered are listed, but only abnormal results are displayed) Labs Reviewed - No data to display  EKG None  Radiology CT Head Wo Contrast  Result Date: 10/22/2021 CLINICAL DATA:  Trauma.  Status post fall. EXAM: CT HEAD WITHOUT CONTRAST CT CERVICAL SPINE WITHOUT CONTRAST TECHNIQUE: Multidetector CT imaging of the head and cervical spine was performed following the standard protocol without intravenous contrast. Multiplanar CT image reconstructions of the cervical spine were also generated. RADIATION DOSE REDUCTION: This exam was performed according to the departmental dose-optimization program which includes automated exposure control, adjustment of the mA and/or kV according to patient size and/or use of iterative reconstruction technique. COMPARISON:  08/01/2021 FINDINGS: CT HEAD FINDINGS Brain: No evidence of acute infarction, hemorrhage, hydrocephalus, extra-axial collection or mass lesion/mass effect. Prominence of the sulci and ventricles compatible with mild brain atrophy. Vascular: No hyperdense vessel or unexpected calcification. Skull: Normal. Negative  for fracture or focal lesion. Sinuses/Orbits: No acute finding. Other: None CT CERVICAL SPINE FINDINGS Alignment: There is no acute posttraumatic mild alignment of the cervical spine. Straightening of normal cervical lordosis may reflect muscle spasm or patient positioning. Skull base and vertebrae: Normal. Soft tissues and spinal canal: No prevertebral fluid or swelling. No visible canal hematoma. Disc levels: Multilevel disc space narrowing and endplate spurring is noted at C3-4, C4-5, C5-6 and C6-7. Upper chest: Negative. Other: None IMPRESSION: 1. No evidence for acute intracranial abnormality. 2. Mild brain atrophy. 3. No evidence for cervical spine fracture or subluxation. 4. Cervical degenerative disc disease. Electronically Signed   By:  Signa Kell M.D.   On: 10/22/2021 12:14   CT Cervical Spine Wo Contrast  Result Date: 10/22/2021 CLINICAL DATA:  Trauma.  Status post fall. EXAM: CT HEAD WITHOUT CONTRAST CT CERVICAL SPINE WITHOUT CONTRAST TECHNIQUE: Multidetector CT imaging of the head and cervical spine was performed following the standard protocol without intravenous contrast. Multiplanar CT image reconstructions of the cervical spine were also generated. RADIATION DOSE REDUCTION: This exam was performed according to the departmental dose-optimization program which includes automated exposure control, adjustment of the mA and/or kV according to patient size and/or use of iterative reconstruction technique. COMPARISON:  08/01/2021 FINDINGS: CT HEAD FINDINGS Brain: No evidence of acute infarction, hemorrhage, hydrocephalus, extra-axial collection or mass lesion/mass effect. Prominence of the sulci and ventricles compatible with mild brain atrophy. Vascular: No hyperdense vessel or unexpected calcification. Skull: Normal. Negative for fracture or focal lesion. Sinuses/Orbits: No acute finding. Other: None CT CERVICAL SPINE FINDINGS Alignment: There is no acute posttraumatic mild alignment of the  cervical spine. Straightening of normal cervical lordosis may reflect muscle spasm or patient positioning. Skull base and vertebrae: Normal. Soft tissues and spinal canal: No prevertebral fluid or swelling. No visible canal hematoma. Disc levels: Multilevel disc space narrowing and endplate spurring is noted at C3-4, C4-5, C5-6 and C6-7. Upper chest: Negative. Other: None IMPRESSION: 1. No evidence for acute intracranial abnormality. 2. Mild brain atrophy. 3. No evidence for cervical spine fracture or subluxation. 4. Cervical degenerative disc disease. Electronically Signed   By: Signa Kell M.D.   On: 10/22/2021 12:14    Procedures Procedures    Medications Ordered in ED Medications - No data to display  ED Course/ Medical Decision Making/ A&P                           Medical Decision Making Amount and/or Complexity of Data Reviewed Radiology: ordered.  Risk Prescription drug management.   71 yo F with a chief complaints of a fall.  Nonsyncopal by history.  Actually occurred 2 days ago.  Complaining of a headache.  CT of the head and C-spine negative.  I discussed results with the patient and family.  We will treat with a short course of Fioricet.  I did offer headache cocktail which she had declined.  3:07 PM:  I have discussed the diagnosis/risks/treatment options with the patient and family.  Evaluation and diagnostic testing in the emergency department does not suggest an emergent condition requiring admission or immediate intervention beyond what has been performed at this time.  They will follow up with PCP. We also discussed returning to the ED immediately if new or worsening sx occur. We discussed the sx which are most concerning (e.g., sudden worsening pain, fever, inability to tolerate by mouth) that necessitate immediate return. Medications administered to the patient during their visit and any new prescriptions provided to the patient are listed below.  Medications given  during this visit Medications - No data to display   The patient appears reasonably screen and/or stabilized for discharge and I doubt any other medical condition or other [facility] requiring further screening, evaluation, or treatment in the ED at this time prior to discharge.          Final Clinical Impression(s) / ED Diagnoses Final diagnoses:  Closed head injury, initial encounter    Rx / DC Orders ED Discharge Orders          Ordered    butalbital-acetaminophen-caffeine (FIORICET) 50-325-40 MG  tablet  Every 6 hours PRN        10/22/21 1446    ondansetron (ZOFRAN-ODT) 4 MG disintegrating tablet        10/22/21 1447              Melene Plan, DO 10/22/21 1507

## 2021-10-23 ENCOUNTER — Ambulatory Visit: Payer: Medicare Other | Admitting: Physician Assistant

## 2021-10-28 ENCOUNTER — Ambulatory Visit (INDEPENDENT_AMBULATORY_CARE_PROVIDER_SITE_OTHER): Payer: 59 | Admitting: Physician Assistant

## 2021-10-28 ENCOUNTER — Encounter: Payer: Self-pay | Admitting: Physician Assistant

## 2021-10-28 DIAGNOSIS — F411 Generalized anxiety disorder: Secondary | ICD-10-CM

## 2021-10-28 DIAGNOSIS — F429 Obsessive-compulsive disorder, unspecified: Secondary | ICD-10-CM | POA: Diagnosis not present

## 2021-10-28 DIAGNOSIS — F5105 Insomnia due to other mental disorder: Secondary | ICD-10-CM

## 2021-10-28 DIAGNOSIS — F99 Mental disorder, not otherwise specified: Secondary | ICD-10-CM

## 2021-10-28 DIAGNOSIS — G2401 Drug induced subacute dyskinesia: Secondary | ICD-10-CM

## 2021-10-28 DIAGNOSIS — F319 Bipolar disorder, unspecified: Secondary | ICD-10-CM

## 2021-10-28 DIAGNOSIS — R413 Other amnesia: Secondary | ICD-10-CM

## 2021-10-28 DIAGNOSIS — S060X0S Concussion without loss of consciousness, sequela: Secondary | ICD-10-CM

## 2021-10-28 MED ORDER — CLONAZEPAM 0.5 MG PO TABS: ORAL_TABLET | ORAL | 1 refills | Status: DC

## 2021-10-28 MED ORDER — LAMOTRIGINE 150 MG PO TABS
300.0000 mg | ORAL_TABLET | Freq: Every day | ORAL | 1 refills | Status: DC
Start: 2021-10-28 — End: 2022-04-09

## 2021-10-28 MED ORDER — ZIPRASIDONE HCL 80 MG PO CAPS
160.0000 mg | ORAL_CAPSULE | Freq: Every day | ORAL | 1 refills | Status: DC
Start: 2021-10-28 — End: 2022-04-09

## 2021-10-28 MED ORDER — FLUOXETINE HCL 40 MG PO CAPS
80.0000 mg | ORAL_CAPSULE | Freq: Every day | ORAL | 1 refills | Status: DC
Start: 2021-10-28 — End: 2022-04-09

## 2021-10-28 MED ORDER — ZIPRASIDONE HCL 40 MG PO CAPS: ORAL_CAPSULE | ORAL | 1 refills | Status: DC

## 2021-10-28 MED ORDER — BUPROPION HCL ER (XL) 300 MG PO TB24
300.0000 mg | ORAL_TABLET | Freq: Every day | ORAL | 1 refills | Status: DC
Start: 2021-10-28 — End: 2022-04-09

## 2021-10-28 NOTE — Progress Notes (Signed)
[facility] Med Check  Patient ID: Shelley Clarke,  MRN: [id number]  PCP: Richmond Campbell., PA-C  Date of Evaluation: 10/28/2021 time spent:40 minutes  Chief Complaint:  Chief Complaint   Anxiety; Depression; Follow-up    HISTORY/CURRENT STATUS: HPI For routine med check, her husband [name] is with her.  Fell twice in the past 2 weeks.  Once was when [place] was at the beach, her foot slipped on the bathroom mat when she was getting up from the toilet and the other time was at home when she leaned over to pick something up and toppled over.  Denies dizziness or feeling faint.  She hit her head at both times but did not lose consciousness.  She went to the ER on 10/22/2021 for the second fall.  Had negative CT of the neck and head for any acute problems.  Has no focal neurodeficits.  Denies imbalance.  She and her husband have started walking again, walked a mile yesterday and she had no problems.  She is supposed to be using a walker even in the home but she does not.  "It is a lot of trouble."    She continues to have abnormal leg movements but this has been going on for years.  We tried [place] a few years ago but for only 1 month and it did not help.  She does not want to take it or try [place], she does not feel that the abnormal movements are tardive dyskinesia or anything related to her mental health medications.  "It is not bothering me so I do not think another medication needs to be added because of it."  No abnormal tongue movements no truncal movements  She complains of memory loss but it has not worsened since hitting her head either time.  "It has been going on for a long time.  I am worried I have dementia."  Has trouble remembering where she put things, what she went into her room for, cannot remember words or if she does remember them then has a hard time speaking them.  Does not have slurred speech but feels like the connection from her brain to her mouth is slow.  She  still has a lot of anxiety, if she does not take the Klonopin "it is not good."  She gets panicky, feeling like something horrible is going to happen.  She has been on Klonopin for years.  It is still effective.  No obsessions or compulsions at this time.  Patient is able to enjoy things.  Energy and motivation are good.  She and her husband are still enjoying retirement.  No extreme sadness, tearfulness, or feelings of hopelessness.  Sleeps well most of the time. ADLs and personal hygiene are normal.   Appetite has not changed.  Weight is stable.  She is not isolating.  Denies suicidal or homicidal thoughts.  Patient denies increased energy with decreased need for sleep, increased talkativeness, racing thoughts, impulsivity or risky behaviors, increased spending, increased libido, grandiosity, increased irritability or anger, paranoia, or hallucinations.  Review of Systems  Constitutional: Negative.   HENT: Negative.    Eyes: Negative.   Respiratory: Negative.    Cardiovascular: Negative.   Gastrointestinal: Negative.   Genitourinary: Negative.   Musculoskeletal:  Positive for falls.       See HPI  Skin: Negative.   Neurological: Negative.   Endo/Heme/Allergies: Negative.   Psychiatric/Behavioral:         See HPI   Individual Medical History/  Review of Systems: Changes? :No      Past medications for mental health diagnoses include: Lithium, Geodon, Wellbutrin Lamictal, Prozac, Lexapro, Klonopin, Depakote, Zoloft, Xanax caused agitation, imbalance, with increased leg/arm movements, Ambien didn't work, Luvox, Allena Earing was not helpful.  Allergies: Patient has no known allergies.  Current Medications:  Current Outpatient Medications:    albuterol (VENTOLIN HFA) 108 (90 Base) MCG/ACT inhaler, Inhale 1-2 puffs into the lungs every 6 (six) hours as needed for wheezing or shortness of breath., Disp: , Rfl:    aspirin EC 81 MG tablet, Take 1 tablet (81 mg total) by mouth 2 (two) times daily  after a meal. Day after surgery, Disp: 60 tablet, Rfl: 1   b complex vitamins capsule, Take 1 capsule by mouth daily., Disp: , Rfl:    butalbital-acetaminophen-caffeine (FIORICET) 50-325-40 MG tablet, Take 1-2 tablets by mouth every 6 (six) hours as needed for headache., Disp: 5 tablet, Rfl: 0   levothyroxine (SYNTHROID, LEVOTHROID) 100 MCG tablet, Take 100 mcg by mouth every Sunday., Disp: , Rfl:    levothyroxine (SYNTHROID, LEVOTHROID) 112 MCG tablet, Take 112 mcg by mouth See admin instructions. Take Monday -Saturday, Disp: , Rfl:    Multiple Vitamin (MULTIVITAMIN) tablet, Take 1 tablet by mouth daily., Disp: , Rfl:    Omega-3 1000 MG CAPS, Take by mouth., Disp: , Rfl:    ondansetron (ZOFRAN-ODT) 4 MG disintegrating tablet, Dissolve 1 tablet under the tongue every 4 hours as needed for nausea/vomiting, Disp: 20 tablet, Rfl: 0   polyethylene glycol (MIRALAX / GLYCOLAX) 17 g packet, Take 17 g by mouth daily mixed in 8 ounces of liquid., Disp: 14 each, Rfl: 0   valACYclovir (VALTREX) 500 MG tablet, Take 500 mg by mouth at bedtime. , Disp: , Rfl:    BIOTIN PO, Take 1 capsule by mouth daily. (Patient not taking: Reported on 10/28/2021), Disp: , Rfl:    buPROPion (WELLBUTRIN XL) 300 MG 24 hr tablet, Take 1 tablet (300 mg total) by mouth daily., Disp: 90 tablet, Rfl: 1   clonazePAM (KLONOPIN) 0.5 MG tablet, 1 po at 8 am, 1 po at 12:30 pm, 2 po at 8 pm, all prn., Disp: 360 tablet, Rfl: 1   diclofenac Sodium (VOLTAREN) 1 % GEL, Apply 4 g topically 4 (four) times daily. (Patient not taking: Reported on 10/28/2021), Disp: 100 g, Rfl: 0   docusate sodium (COLACE) 100 MG capsule, Take 1 capsule (100 mg total) by mouth 2 (two) times daily as needed for mild constipation. (Patient not taking: Reported on 10/28/2021), Disp: 30 capsule, Rfl: 1   FLUoxetine (PROZAC) 40 MG capsule, Take 2 capsules (80 mg total) by mouth daily., Disp: 180 capsule, Rfl: 1   lamoTRIgine (LAMICTAL) 150 MG tablet, Take 2 tablets (300 mg  total) by mouth at bedtime., Disp: 180 tablet, Rfl: 1   ziprasidone (GEODON) 40 MG capsule, 1 in the morning, 1 at lunch, Disp: 180 capsule, Rfl: 1   ziprasidone (GEODON) 80 MG capsule, Take 2 capsules (160 mg total) by mouth at bedtime., Disp: 180 capsule, Rfl: 1 Medication Side Effects: none  Family Medical/ Social History: Changes? Her husband had a closed head injury from a fall, was in hospital a week or so, is ok now.  MENTAL HEALTH EXAM:  There were no vitals taken for this visit.There is no height or weight on file to calculate BMI.  General Appearance: Casual, Neat and Well Groomed  Eye Contact:  Good  Speech:  Clear and Coherent and Normal Rate  Volume:  Normal  Mood:  Euthymic  Affect:  Appropriate  Thought Process:  Goal Directed and Descriptions of Associations: Circumstantial  Orientation:  Full (Time, Place, and Person)  Thought Content: Logical   Suicidal Thoughts:  No  Homicidal Thoughts:  No  Memory:  Recent;   Fair Otherwise normal for the most part.  Judgement:  Good  Insight:  Good  Psychomotor Activity:   Flexes and extends her right foot often, no abnormal mouth movements     Concentration:  Concentration: Good and Attention Span: Good  Recall:  Good  Fund of Knowledge: Good  Language: Good  Assets:  Desire for Improvement Financial Resources/Insurance Housing Transportation Vocational/Educational  ADL's:  Intact  Cognition: WNL  Prognosis:  Good   AIMS    [place] Office Visit from 10/28/2021 in [place] Office Visit from 02/08/2020 in [place] Office Visit from 12/18/2019 in [place] Visit from 11/20/2019 in [place] Total Score 7 21 20 22       [place] Office Visit from 10/28/2021 in [place]  Total Score (max 30 points ) 26      [place] [facility] from 10/22/2021 in [facility]  ED from 07/02/2021 in [facility] Admission (Discharged) from 07/10/2020 in [place] No Risk No Risk No Risk      Her primary provider follows labs including glucose, lipids, and EKG annually.  07/01/2021  EKG normal except for sinus bradycardia.  06/25/2021 CBC with differential, CMP, hemoglobin A1c, lipid profile, and TSH reviewed.  10/22/2021 CT head and C-spine show no evidence of acute abnormalities.  DIAGNOSES:    ICD-10-CM   1. Bipolar I disorder ([place])  F31.9     2. Obsessive-compulsive disorder, unspecified type  F42.9     3. Generalized anxiety disorder  F41.1     4. Insomnia due to other mental disorder  F51.05    F99     5. Memory difficulties  R41.3     6. Tardive dyskinesia  G24.01     7. Concussion without loss of consciousness, sequela ([place])  S06.0X0S       Receiving Psychotherapy: No   RECOMMENDATIONS:  PDMP was reviewed.  Last Klonopin filled 07/28/2021.  Fioricet also prescribed by PCP. I provided 40 minutes of face to face time during this encounter, including time spent before and after the visit in records review, medical decision making, counseling pertinent to today's visit, and charting.   As far as her mental health medications go she is doing well so no changes need to be made.  We discussed at length the fact that Klonopin, or any benzodiazepine, can cause confusion, imbalance, and falls especially in the elderly.  She has been on it for years.  Weighing the benefits it gives versus the risk of falling it may cause, patient accepts the fall risk.  If she continues to fall, will want to wean her off of it, and to find a nonbenzo to treat the anxiety.  She knows to use her walker all the time even in the house.  We again discussed the abnormal leg movements.  Patient states it is not bothering her and she does not want to go on another medication.  Austedo might be beneficial but I  have concerns of Cogentin or Artane causing even more sedation and possibly worsening falls, that type of abnormal  movement probably would not respond to the latter meds anyway.  She and her husband prefer to observe.  As far as the memory issues go that could be secondary to multiple medications, anxiety, depression or other factors.  She would like to see a neurologist for evaluation, and in light of the recent concussions I think that is a good idea.  I will refer her to Dr. Lucia Gaskins for memory issues, recent falls with concussion.  Continue Wellbutrin XL 300 mg, 1 p.o. every morning. Continue Klonopin 0.5 mg, 1 p.o. at 8 AM, 1 p.o. at 12:30 PM, 2 p.o. at 8 PM, all as needed. Continue Prozac 40 mg, 2 p.o. daily. Continue Lamictal 150 mg, 2 p.o. nightly. Continue Geodon 40 mg, 1 p.o. every morning and 1 p.o. at lunch. Continue Geodon 80 mg, 2 p.o. nightly. Continue vitamins as per med list. Return in 6 months.  Patient prefers long intervals for follow-up but if neurology wants her to be seen sooner she will come in or if new problems arise.  Melony Overly, PA-C

## 2021-11-17 ENCOUNTER — Telehealth: Payer: Self-pay | Admitting: Physician Assistant

## 2021-11-17 NOTE — Telephone Encounter (Signed)
Referral was sent [facility], Dr. Jaynee Eagles, 11/11/21

## 2021-11-29 ENCOUNTER — Emergency Department (HOSPITAL_BASED_OUTPATIENT_CLINIC_OR_DEPARTMENT_OTHER)
Admission: EM | Admit: 2021-11-29 | Discharge: 2021-11-30 | Disposition: A | Discharge status: Home / Self Care | Payer: 59 | Attending: Emergency Medicine | Admitting: Emergency Medicine

## 2021-11-29 ENCOUNTER — Other Ambulatory Visit: Payer: Self-pay

## 2021-11-29 DIAGNOSIS — R300 Dysuria: Secondary | ICD-10-CM | POA: Insufficient documentation

## 2021-11-29 DIAGNOSIS — R350 Frequency of micturition: Secondary | ICD-10-CM | POA: Diagnosis not present

## 2021-11-29 DIAGNOSIS — K5900 Constipation, unspecified: Secondary | ICD-10-CM | POA: Diagnosis not present

## 2021-11-29 DIAGNOSIS — R1030 Lower abdominal pain, unspecified: Secondary | ICD-10-CM | POA: Diagnosis present

## 2021-11-29 DIAGNOSIS — Z7982 Long term (current) use of aspirin: Secondary | ICD-10-CM | POA: Diagnosis not present

## 2021-11-29 LAB — CBC WITH DIFFERENTIAL/PLATELET
Abs Immature Granulocytes: 0.01 K/uL (ref 0.00–0.07)
Basophils Absolute: 0 K/uL (ref 0.0–0.1)
Basophils Relative: 1 %
Eosinophils Absolute: 0.1 K/uL (ref 0.0–0.5)
Eosinophils Relative: 2 %
HCT: 36.1 % (ref 36.0–46.0)
Hemoglobin: 12.2 g/dL (ref 12.0–15.0)
Immature Granulocytes: 0 %
Lymphocytes Relative: 32 %
Lymphs Abs: 2.5 K/uL (ref 0.7–4.0)
MCH: 33.1 pg (ref 26.0–34.0)
MCHC: 33.8 g/dL (ref 30.0–36.0)
MCV: 97.8 fL (ref 80.0–100.0)
Monocytes Absolute: 0.8 K/uL (ref 0.1–1.0)
Monocytes Relative: 10 %
Neutro Abs: 4.5 K/uL (ref 1.7–7.7)
Neutrophils Relative %: 55 %
Platelets: 228 K/uL (ref 150–400)
RBC: 3.69 MIL/uL — ABNORMAL LOW (ref 3.87–5.11)
RDW: 13.4 % (ref 11.5–15.5)
WBC: 8.1 K/uL (ref 4.0–10.5)
nRBC: 0 % (ref 0.0–0.2)

## 2021-11-29 LAB — URINALYSIS, ROUTINE W REFLEX MICROSCOPIC
Bilirubin Urine: NEGATIVE
Glucose, UA: NEGATIVE mg/dL
Hgb urine dipstick: NEGATIVE
Ketones, ur: NEGATIVE mg/dL
Leukocytes,Ua: NEGATIVE
Nitrite: NEGATIVE
Protein, ur: NEGATIVE mg/dL
Specific Gravity, Urine: 1.007 (ref 1.005–1.030)
pH: 6.5 (ref 5.0–8.0)

## 2021-11-29 NOTE — ED Triage Notes (Signed)
POV, pt c/o urinary frequency, burning with urination and pelvic pain that began today, pt also wants lower lip looked at due to sore that will not heal.

## 2021-11-29 NOTE — ED Provider Notes (Signed)
[facility] DEPT  Provider Note  CSN: [id number] Arrival date & time: 11/29/21 1949  History Chief Complaint  Patient presents with   Dysuria    Shelley Clarke is a 71 y.o. female with no significant medical problems reports she has had several hours of lower abdominal pain, urinary frequency and occasional burning. No fever or vomiting. She has chronic back pain but no change from usual. She had a fall several days ago when she stumbled but did not hit the ground and no reported concerning injuries. She has had prior partial hysterectomy and appendectomy.   She is also concerned about a 'spot on her lip' she has had for months that won't heal, saw Dermatology and she states all they did was give her a tube of petrolatum.    Home Medications Prior to Admission medications   Medication Sig Start Date End Date Taking? Authorizing Provider  albuterol (VENTOLIN HFA) 108 (90 Base) MCG/ACT inhaler Inhale 1-2 puffs into the lungs every 6 (six) hours as needed for wheezing or shortness of breath.    [provider]  aspirin EC 81 MG tablet Take 1 tablet (81 mg total) by mouth 2 (two) times daily after a meal. Day after surgery 07/10/20   Susa Day, MD  b complex vitamins capsule Take 1 capsule by mouth daily.    [provider]  BIOTIN PO Take 1 capsule by mouth daily. Patient not taking: Reported on 10/28/2021    [provider]  buPROPion (WELLBUTRIN XL) 300 MG 24 hr tablet Take 1 tablet (300 mg total) by mouth daily. 10/28/21   Addison Lank, PA-C  butalbital-acetaminophen-caffeine (FIORICET) [phone] MG tablet Take 1-2 tablets by mouth every 6 (six) hours as needed for headache. 10/22/21 10/22/22  Deno Etienne, DO  clonazePAM (KLONOPIN) 0.5 MG tablet 1 po at 8 am, 1 po at 12:30 pm, 2 po at 8 pm, all prn. 10/28/21   Adelene Idler, Helene Kelp T, PA-C  diclofenac Sodium (VOLTAREN) 1 % GEL Apply 4 g topically 4 (four) times daily. Patient not taking:  Reported on 10/28/2021 11/23/19   Deno Etienne, DO  docusate sodium (COLACE) 100 MG capsule Take 1 capsule (100 mg total) by mouth 2 (two) times daily as needed for mild constipation. Patient not taking: Reported on 10/28/2021 07/10/20   Susa Day, MD  FLUoxetine (PROZAC) 40 MG capsule Take 2 capsules (80 mg total) by mouth daily. 10/28/21   Addison Lank, PA-C  lamoTRIgine (LAMICTAL) 150 MG tablet Take 2 tablets (300 mg total) by mouth at bedtime. 10/28/21   Donnal Moat T, PA-C  levothyroxine (SYNTHROID, LEVOTHROID) 100 MCG tablet Take 100 mcg by mouth every Sunday.    [provider]  levothyroxine (SYNTHROID, LEVOTHROID) 112 MCG tablet Take 112 mcg by mouth See admin instructions. Take Monday -Saturday    [provider]  Multiple Vitamin (MULTIVITAMIN) tablet Take 1 tablet by mouth daily.    [provider]  Omega-3 1000 MG CAPS Take by mouth.    [provider]  ondansetron (ZOFRAN-ODT) 4 MG disintegrating tablet Dissolve 1 tablet under the tongue every 4 hours as needed for nausea/vomiting 10/22/21   Deno Etienne, DO  polyethylene glycol (MIRALAX / GLYCOLAX) 17 g packet Take 17 g by mouth daily mixed in 8 ounces of liquid. 07/10/20   Susa Day, MD  valACYclovir (VALTREX) 500 MG tablet Take 500 mg by mouth at bedtime.     [provider]  ziprasidone (GEODON) 40 MG  capsule 1 in the morning, 1 at lunch 10/28/21   Donnal Moat T, PA-C  ziprasidone (GEODON) 80 MG capsule Take 2 capsules (160 mg total) by mouth at bedtime. 10/28/21   Addison Lank, PA-C     Allergies    Patient has no known allergies.   Review of Systems   Review of Systems Please see HPI for pertinent positives and negatives  Physical Exam BP 135/66 (BP Location: Right Arm)   Pulse (!) 55   Temp 98.5 F (36.9 C) (Oral)   Resp 18   Ht 5\' 4"  (1.626 m)   Wt 55.8 kg   SpO2 98%   BMI 21.11 kg/m   Physical Exam Vitals and nursing note reviewed.   Constitutional:      Appearance: Normal appearance.  HENT:     Head: Normocephalic and atraumatic.     Nose: Nose normal.     Mouth/Throat:     Mouth: Mucous membranes are moist.     Comments: There is no wound or sore on her lower lip, there is an area that appears to be scar tissue from a remote would she reports she had sutured years ago.  Eyes:     Extraocular Movements: Extraocular movements intact.     Conjunctiva/sclera: Conjunctivae normal.  Cardiovascular:     Rate and Rhythm: Normal rate.  Pulmonary:     Effort: Pulmonary effort is normal.     Breath sounds: Normal breath sounds.  Abdominal:     General: Abdomen is flat.     Palpations: Abdomen is soft.     Tenderness: There is abdominal tenderness (lower abdomen, R>L). There is no guarding.  Musculoskeletal:        General: No swelling. Normal range of motion.     Cervical back: Neck supple.  Skin:    General: Skin is warm and dry.  Neurological:     General: No focal deficit present.     Mental Status: She is alert.  Psychiatric:        Mood and Affect: Mood normal.     ED Results / Procedures / Treatments   EKG None  Procedures Procedures  Medications Ordered in the ED Medications  iohexol (OMNIPAQUE) 300 MG/ML solution 100 mL (80 mLs Intravenous Contrast Given 11/30/21 0024)    Initial Impression and Plan  Patient here primarily for dysuria, her UA in triage is normal. She has some tenderness across her lower abdomen, but no guarding. Will check basic labs and send for CT to ensure no signs of ovarian process or diverticulitis. Less likely she is having mild renal colic. She overall looks well now. She was advised to discuss her lip concerns with her PCP, appears to be old scar tissue, no acute process.   ED Course   Clinical Course as of 11/30/21 0121  Sat Nov 29, 2021  2342 CBC is normal.  [CS]  Sun Nov 30, 2021  0011 BMP is normal.  [CS]  0120 I personally viewed the images from radiology  studies and agree with radiologist interpretation: CT shows constipation. Patient admits this has been an issue, she is not currently on any kind of laxatives or stool softeners. Advised to begin Miralax, can take up to three times a day until regular then decrease to daily or PRN. Otherwise workup is neg. Plan discharge home with PCP follow up. RTED for any other concerns.  [CS]    Clinical Course User Index [CS] Truddie Hidden, MD  MDM Rules/Calculators/A&P Medical Decision Making Problems Addressed: Constipation, unspecified constipation type: acute illness or injury  Amount and/or Complexity of Data Reviewed Labs: ordered. Decision-making details documented in ED Course. Radiology: ordered and independent interpretation performed. Decision-making details documented in ED Course.  Risk Prescription drug management.    Final Clinical Impression(s) / ED Diagnoses Final diagnoses:  Constipation, unspecified constipation type    Rx / DC Orders ED Discharge Orders     None        Pollyann Savoy, MD 11/30/21 0121

## 2021-11-30 ENCOUNTER — Emergency Department (HOSPITAL_BASED_OUTPATIENT_CLINIC_OR_DEPARTMENT_OTHER): Payer: 59

## 2021-11-30 LAB — BASIC METABOLIC PANEL
Anion gap: 7 (ref 5–15)
BUN: 26 mg/dL — ABNORMAL HIGH (ref 8–23)
CO2: 29 mmol/L (ref 22–32)
Calcium: 9.2 mg/dL (ref 8.9–10.3)
Chloride: 99 mmol/L (ref 98–111)
Creatinine, Ser: 1.11 mg/dL — ABNORMAL HIGH (ref 0.44–1.00)
GFR, Estimated: 54 mL/min — ABNORMAL LOW (ref >60)
Glucose, Bld: 90 mg/dL (ref 70–99)
Potassium: 4.1 mmol/L (ref 3.5–5.1)
Sodium: 135 mmol/L (ref 135–145)

## 2021-11-30 MED ORDER — IOHEXOL 300 MG/ML  SOLN
100.0000 mL | Freq: Once | INTRAMUSCULAR | Status: AC | PRN
  Administered 2021-11-30: 80 mL via INTRAVENOUS

## 2021-11-30 NOTE — ED Notes (Signed)
Patient transported to CT 

## 2021-12-29 ENCOUNTER — Encounter: Payer: Self-pay | Admitting: Psychiatry

## 2021-12-29 ENCOUNTER — Ambulatory Visit: Payer: 59 | Admitting: Psychiatry

## 2021-12-29 ENCOUNTER — Telehealth: Payer: Self-pay | Admitting: Psychiatry

## 2021-12-29 VITALS — BP 141/73 | HR 61 | Ht 64.0 in | Wt 130.6 lb

## 2021-12-29 DIAGNOSIS — R413 Other amnesia: Secondary | ICD-10-CM | POA: Diagnosis not present

## 2021-12-29 DIAGNOSIS — R251 Tremor, unspecified: Secondary | ICD-10-CM

## 2021-12-29 NOTE — Patient Instructions (Signed)
Preventing Falls at Home  Falls are common, often dreaded events in the lives of older people. Aside from the obvious injuries and even death that may result, fall can cause wide-ranging consequences including loss of independence, mental decline, decreased activity and mobility. Younger people are also at risk of falling, especially those with chronic illnesses and fatigue.  Ways to reduce risk for falling Examine diet and medications. Warm foods and alcohol dilate blood vessels, which can lead to dizziness when standing. Sleep aids, antidepressants and pain medications can also increase the likelihood of a fall.  Get a vision exam. Poor vision, cataracts and glaucoma increase the chances of falling.  Check foot gear. Shoes should fit snugly and have a sturdy, nonskid sole and a broad, low heel  Participate in a physician-approved exercise program to build and maintain muscle strength and improve balance and coordination. Programs that use ankle weights or stretch bands are excellent for muscle-strengthening. Water aerobics programs and low-impact Tai Chi programs have also been shown to improve balance and coordination.  Increase vitamin D intake. Vitamin D improves muscle strength and increases the amount of calcium the body is able to absorb and deposit in bones.  How to prevent falls from common hazards Floors - Remove all loose wires, cords, and throw rugs. Minimize clutter. Make sure rugs are anchored and smooth. Keep furniture in its usual place.  Chairs -- Use chairs with straight backs, armrests and firm seats. Add firm cushions to existing pieces to add height.  Bathroom - Install grab bars and non-skid tape in the tub or shower. Use a bathtub transfer bench or a shower chair with a back support Use an elevated toilet seat and/or safety rails to assist standing from a low surface. Do not use towel racks or bathroom tissue holders to help you stand.  Lighting - Make sure halls,  stairways, and entrances are well-lit. Install a night light in your bathroom or hallway. Make sure there is a light switch at the top and bottom of the staircase. Turn lights on if you get up in the middle of the night. Make sure lamps or light switches are within reach of the bed if you have to get up during the night.  Kitchen - Install non-skid rubber mats near the sink and stove. Clean spills immediately. Store frequently used utensils, pots, pans between waist and eye level. This helps prevent reaching and bending. Sit when getting things out of lower cupboards.  Living room/ Bedrooms - Place furniture with wide spaces in between, giving enough room to move around. Establish a route through the living room that gives you something to hold onto as you walk.  Stairs - Make sure treads, rails, and rugs are secure. Install a rail on both sides of the stairs. If stairs are a threat, it might be helpful to arrange most of your activities on the lower level to reduce the number of times you must climb the stairs.  Entrances and doorways - Install metal handles on the walls adjacent to the doorknobs of all doors to make it more secure as you travel through the doorway.  Tips for maintaining balance Keep at least one hand free at all times. Try using a backpack or fanny pack to hold things rather than carrying them in your hands. Never carry objects in both hands when walking as this interferes with keeping your balance.  Attempt to swing both arms from front to back while walking. This might require a conscious   effort if Parkinson's disease has diminished your movement. It will, however, help you to maintain balance and posture, and reduce fatigue.  Consciously lift your feet off of the ground when walking. Shuffling and dragging of the feet is a common culprit in losing your balance.  When trying to navigate turns, use a "U" technique of facing forward and making a wide turn, rather than pivoting  sharply.  Try to stand with your feet shoulder-length apart. When your feet are close together for any length of time, you increase your risk of losing your balance and falling.  Do one thing at a time. Don't try to walk and accomplish another task, such as reading or looking around. The decrease in your automatic reflexes complicates motor function, so the less distraction, the better.  Do not wear rubber or gripping soled shoes, they might "catch" on the floor and cause tripping.  Move slowly when changing positions. Use deliberate, concentrated movements and, if needed, use a grab bar or walking aid. Count 15 seconds between each movement. For example, when rising from a seated position, wait 15 seconds after standing to begin walking.  If balance is a continuous problem, you might want to consider a walking aid such as a cane, walking stick, or walker. Once you've mastered walking with help, you might be ready to try it on your own again.  

## 2021-12-29 NOTE — Progress Notes (Signed)
[facility]  PATIENT: Shelley Clarke DOB: March 10, 1950  REFERRING CLINICIAN: Cherie Ouch, PA-C HISTORY FROM: self, husband REASON FOR VISIT: memory loss   HISTORICAL  CHIEF COMPLAINT:  Chief Complaint  Patient presents with   New Patient (Initial Visit)    Patient in rm 1 with husband. Patient is concerned about early dementia, she states she has had a bad balance problem standing still, sometimes has swallowing problems, and is overly forgetful with things such as cooking and brushing teeth. States she is having trouble remembering things to take notes or write down. States she has problems with tremors in legs but husband says her psychiatrist says it may be medication side effects. Husband states she has had 2 concussion in October but MRI was neg.     HISTORY OF PRESENT ILLNESS:  The patient presents for evaluation of memory loss which has been present over several years. Per documentation review she had reported memory issues as far back as 2012. She reports worsening forgetfulness over time. Has to take frequent notes to remember things. She will lose track of ingredients while cooking. Will forget what the preacher says in church.  She had a mechanical fall and hit her head in October 2023. CTH in the ED showed mild brain atrophy, no acute process. MMSE done through her Psychiatrist later that month was 26/30, within normal limits. She also had an MRI brain done in 2019 which showed mild volume loss but was otherwise unremarkable.  She reports a tremor in her legs and L>R hand which has been present for several years. She also has a sensation of restlessness in her legs. She was told this was likely medication induced (takes Lamictal and Geodon for bipolar disorder). She can suppress the tremor if she focuses on it. Balance has felt off since a hospitalization for pneumonia and hypoxia in 2019. Has gone to PT multiple times which has been helpful, but she does  not consistently do the exercises or use her walker. Last went to PT a few months ago.  TSH drawn earlier this week was slightly low (0.41).  TBI: Larey Seat and hit her head 2 months ago, CTH was negative for acute process Stroke:  no past history of stroke Seizures:  no past history of seizures Sleep:  no history of sleep apnea. 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 Has never left the stove on Driving: she is not driving Bills: Handles her own finances but will sometimes lose track of the money in her accounts. Has trouble controlling her spending due to her bipolar disorder Medications: Husband helps organize her medications Ever left the stove on by accident?: no Forgetting loved ones names?: no  OTHER MEDICAL CONDITIONS: bipolar disorder, hypothyroidism, CKD, tardive dyskinesia   REVIEW OF SYSTEMS: Full 14 system review of systems performed and negative with exception of: memory loss  ALLERGIES: No Known Allergies  HOME MEDICATIONS: Outpatient Medications Prior to Visit  Medication Sig Dispense Refill   albuterol (VENTOLIN HFA) 108 (90 Base) MCG/ACT inhaler Inhale 1-2 puffs into the lungs every 6 (six) hours as needed for wheezing or shortness of breath.     b complex vitamins capsule Take 1 capsule by mouth daily.     buPROPion (WELLBUTRIN XL) 300 MG 24 hr tablet Take 1 tablet (300 mg total) by mouth daily. 90 tablet 1  clonazePAM (KLONOPIN) 0.5 MG tablet 1 po at 8 am, 1 po at 12:30 pm, 2 po at 8 pm, all prn. 360 tablet 1   FLUoxetine (PROZAC) 40 MG capsule Take 2 capsules (80 mg total) by mouth daily. 180 capsule 1   lamoTRIgine (LAMICTAL) 150 MG tablet Take 2 tablets (300 mg total) by mouth at bedtime. 180 tablet 1   levothyroxine (SYNTHROID, LEVOTHROID) 100 MCG tablet Take 100 mcg by mouth every Sunday.     levothyroxine  (SYNTHROID, LEVOTHROID) 112 MCG tablet Take 112 mcg by mouth See admin instructions. Take Monday -Saturday     Multiple Vitamin (MULTIVITAMIN) tablet Take 1 tablet by mouth daily.     Omega-3 1000 MG CAPS Take by mouth.     polyethylene glycol (MIRALAX / GLYCOLAX) 17 g packet Take 17 g by mouth daily mixed in 8 ounces of liquid. 14 each 0   valACYclovir (VALTREX) 500 MG tablet Take 500 mg by mouth at bedtime.      ziprasidone (GEODON) 40 MG capsule 1 in the morning, 1 at lunch 180 capsule 1   ziprasidone (GEODON) 80 MG capsule Take 2 capsules (160 mg total) by mouth at bedtime. 180 capsule 1   aspirin EC 81 MG tablet Take 1 tablet (81 mg total) by mouth 2 (two) times daily after a meal. Day after surgery (Patient taking differently: Take 81 mg by mouth daily. Day after surgery) 60 tablet 1   BIOTIN PO Take 1 capsule by mouth daily. (Patient not taking: Reported on 10/28/2021)     butalbital-acetaminophen-caffeine (FIORICET) 50-325-40 MG tablet Take 1-2 tablets by mouth every 6 (six) hours as needed for headache. 5 tablet 0   diclofenac Sodium (VOLTAREN) 1 % GEL Apply 4 g topically 4 (four) times daily. (Patient not taking: Reported on 10/28/2021) 100 g 0   docusate sodium (COLACE) 100 MG capsule Take 1 capsule (100 mg total) by mouth 2 (two) times daily as needed for mild constipation. (Patient not taking: Reported on 10/28/2021) 30 capsule 1   ondansetron (ZOFRAN-ODT) 4 MG disintegrating tablet Dissolve 1 tablet under the tongue every 4 hours as needed for nausea/vomiting 20 tablet 0   No facility-administered medications prior to visit.    PAST MEDICAL HISTORY: Past Medical History:  Diagnosis Date   Atypical chest pain 11/15/2017   Bipolar disorder (HCC)    Depression    Exercise-induced asthma    Family history of adverse reaction to anesthesia    Hypothyroidism    Pneumonia     PAST SURGICAL HISTORY: Past Surgical History:  Procedure Laterality Date   ABDOMINAL HYSTERECTOMY      BREAST LUMPECTOMY Left    CESAREAN SECTION     times 2   TOTAL KNEE ARTHROPLASTY Right 07/10/2020   Procedure: TOTAL KNEE ARTHROPLASTY;  Surgeon: Jene Every, MD;  Location: [facility];  Service: Orthopedics;  Laterality: Right;    FAMILY HISTORY: Family History  Problem Relation Age of Onset   Breast cancer Mother 11   Hypertension Mother    Diabetes Mother    Luiz Blare' disease Sister    Heart attack Father    Brain cancer Maternal Grandmother    Kidney disease Maternal Grandfather        Bright's disease   Stroke Paternal Grandmother    Endometriosis Child    Hypertension Child     SOCIAL HISTORY: Social History   Socioeconomic History   Marital status: Married    Spouse name: Not on file   Number  of children: Not on file   Years of education: Not on file   Highest education level: Not on file  Occupational History   Not on file  Tobacco Use   Smoking status: Never   Smokeless tobacco: Never  Vaping Use   Vaping Use: Never used  Substance and Sexual Activity   Alcohol use: Not Currently   Drug use: Never   Sexual activity: Not on file  Other Topics Concern   Not on file  Social History Narrative   Not on file   Social Determinants of Health   Financial Resource Strain: Not on file  Food Insecurity: Not on file  Transportation Needs: Not on file  Physical Activity: Not on file  Stress: Not on file  Social Connections: Not on file  Intimate Partner Violence: Not on file     PHYSICAL EXAM  GENERAL EXAM/CONSTITUTIONAL: Vitals:  Vitals:   12/29/21 1133  BP: (!) 141/73  Pulse: 61  Weight: 130 lb 9.6 oz (59.2 kg)  Height: 5\' 4"  (1.626 m)   Body mass index is 22.42 kg/m. Wt Readings from Last 3 Encounters:  12/29/21 130 lb 9.6 oz (59.2 kg)  11/29/21 123 lb (55.8 kg)  10/22/21 132 lb 0.9 oz (59.9 kg)   NEUROLOGIC: MENTAL STATUS:     12/29/2021   11:20 AM  Montreal Cognitive Assessment   Visuospatial/ Executive (0/5) 4  Naming (0/3) 3   Attention: Read list of digits (0/2) 2  Attention: Read list of letters (0/1) 1  Attention: Serial 7 subtraction starting at 100 (0/3) 1  Language: Repeat phrase (0/2) 2  Language : Fluency (0/1) 1  Abstraction (0/2) 2  Delayed Recall (0/5) 3  Orientation (0/6) 6  Total 25    CRANIAL NERVE:  2nd, 3rd, 4th, 6th - pupils equal and reactive to light, visual fields full to confrontation, extraocular muscles intact, no nystagmus 5th - facial sensation symmetric 7th - facial strength symmetric 8th - hearing intact 9th - palate elevates symmetrically, uvula midline 11th - shoulder shrug symmetric 12th - tongue protrusion midline  MOTOR:  normal bulk and tone, no cogwheeling, full strength in the BUE, BLE  SENSORY:  normal and symmetric to light touch all 4 extremities  COORDINATION:  finger-nose-finger, fine finger movements normal, Resting tremor present in L>R hand  REFLEXES:  deep tendon reflexes present and symmetric  GAIT/STATION:  Decreased stride length     DIAGNOSTIC DATA (LABS, IMAGING, TESTING) - I reviewed patient records, labs, notes, testing and imaging myself where available.  Lab Results  Component Value Date   WBC 8.1 11/29/2021   HGB 12.2 11/29/2021   HCT 36.1 11/29/2021   MCV 97.8 11/29/2021   PLT 228 11/29/2021      Component Value Date/Time   NA 135 11/29/2021 2333   K 4.1 11/29/2021 2333   CL 99 11/29/2021 2333   CO2 29 11/29/2021 2333   GLUCOSE 90 11/29/2021 2333   BUN 26 (H) 11/29/2021 2333   CREATININE 1.11 (H) 11/29/2021 2333   CALCIUM 9.2 11/29/2021 2333   GFRNONAA 54 (L) 11/29/2021 2333   GFRAA 47 (L) 11/10/2017 0742   No results found for: "CHOL", "HDL", "LDLCALC", "LDLDIRECT", "TRIG", "CHOLHDL" No results found for: "HGBA1C" No results found for: "VITAMINB12" Lab Results  Component Value Date   TSH 3.097 11/02/2017     ASSESSMENT AND PLAN  71 y.o. year old female with a history of bipolar disorder, hypothyroidism, CKD,  tardive dyskinesia who presents for evaluation of  memory loss. MOCA score today is 25/30, which is within normal limits. She does require some assistance in her ADLs, though much of this appears to be more related to her mood disorder rather than her memory. Will refer for neuropsychological testing to better assess if her memory issues are more related to her mood disorder and medications vs an early neurodegenerative process. Suspect her tremor is medication-induced secondary to long term use of Geodon and Lamictal. She is not interested in changing these medications as her mood has been well-controlled with this regimen. Discussed DAT-scan to help rule out underlying Parkinson's disease, which patient declined at this time.   1. Memory loss       PLAN: - Labs: B12 level - Referral for neuropsychological testing  Orders Placed This Encounter  Procedures   Vitamin B12   Ambulatory referral to Neuropsychology    No orders of the defined types were placed in this encounter.   Return in about 6 months (around 06/30/2022).  I spent an average of 62 minutes chart reviewing and counseling the patient, with at least 50% of the time face to face with the patient.   Genia Harold, MD 12/29/21 12:25 PM  [facility] [address] [place], [place] [address] [phone]

## 2021-12-29 NOTE — Telephone Encounter (Signed)
Referral for Neuropsychology fax to [facility]. Phone: [phone], Fax": [phone]

## 2021-12-30 LAB — VITAMIN B12: Vitamin B-12: 1255 pg/mL — ABNORMAL HIGH (ref 232–1245)

## 2022-01-14 ENCOUNTER — Encounter: Payer: Self-pay | Admitting: Psychology

## 2022-01-17 ENCOUNTER — Other Ambulatory Visit (HOSPITAL_COMMUNITY): Payer: Self-pay

## 2022-02-20 ENCOUNTER — Telehealth: Payer: Self-pay | Admitting: Physician Assistant

## 2022-02-20 NOTE — Telephone Encounter (Signed)
Prior Authorization [employer] Clonazepam 0.5 mg #360/90 days.  Approved Effective:  01/12/22-01/12/23

## 2022-03-27 ENCOUNTER — Other Ambulatory Visit: Payer: Self-pay | Admitting: Family Medicine

## 2022-03-27 ENCOUNTER — Ambulatory Visit
Admission: RE | Admit: 2022-03-27 | Discharge: 2022-03-27 | Disposition: A | Discharge status: Home / Self Care | Payer: 59 | Source: Ambulatory Visit | Attending: Family Medicine | Admitting: Family Medicine

## 2022-03-27 DIAGNOSIS — M545 Low back pain, unspecified: Secondary | ICD-10-CM

## 2022-03-27 DIAGNOSIS — M25551 Pain in right hip: Secondary | ICD-10-CM

## 2022-03-27 DIAGNOSIS — M549 Dorsalgia, unspecified: Secondary | ICD-10-CM

## 2022-04-05 IMAGING — DX DG KNEE 1-2V*R*
2 series · 2 of 2 positions shown · non-contrast
Comparison: December 15, 2017

CLINICAL DATA: Status post total knee replacement

EXAM:
RIGHT KNEE - 1-2 VIEW

[knee ap]
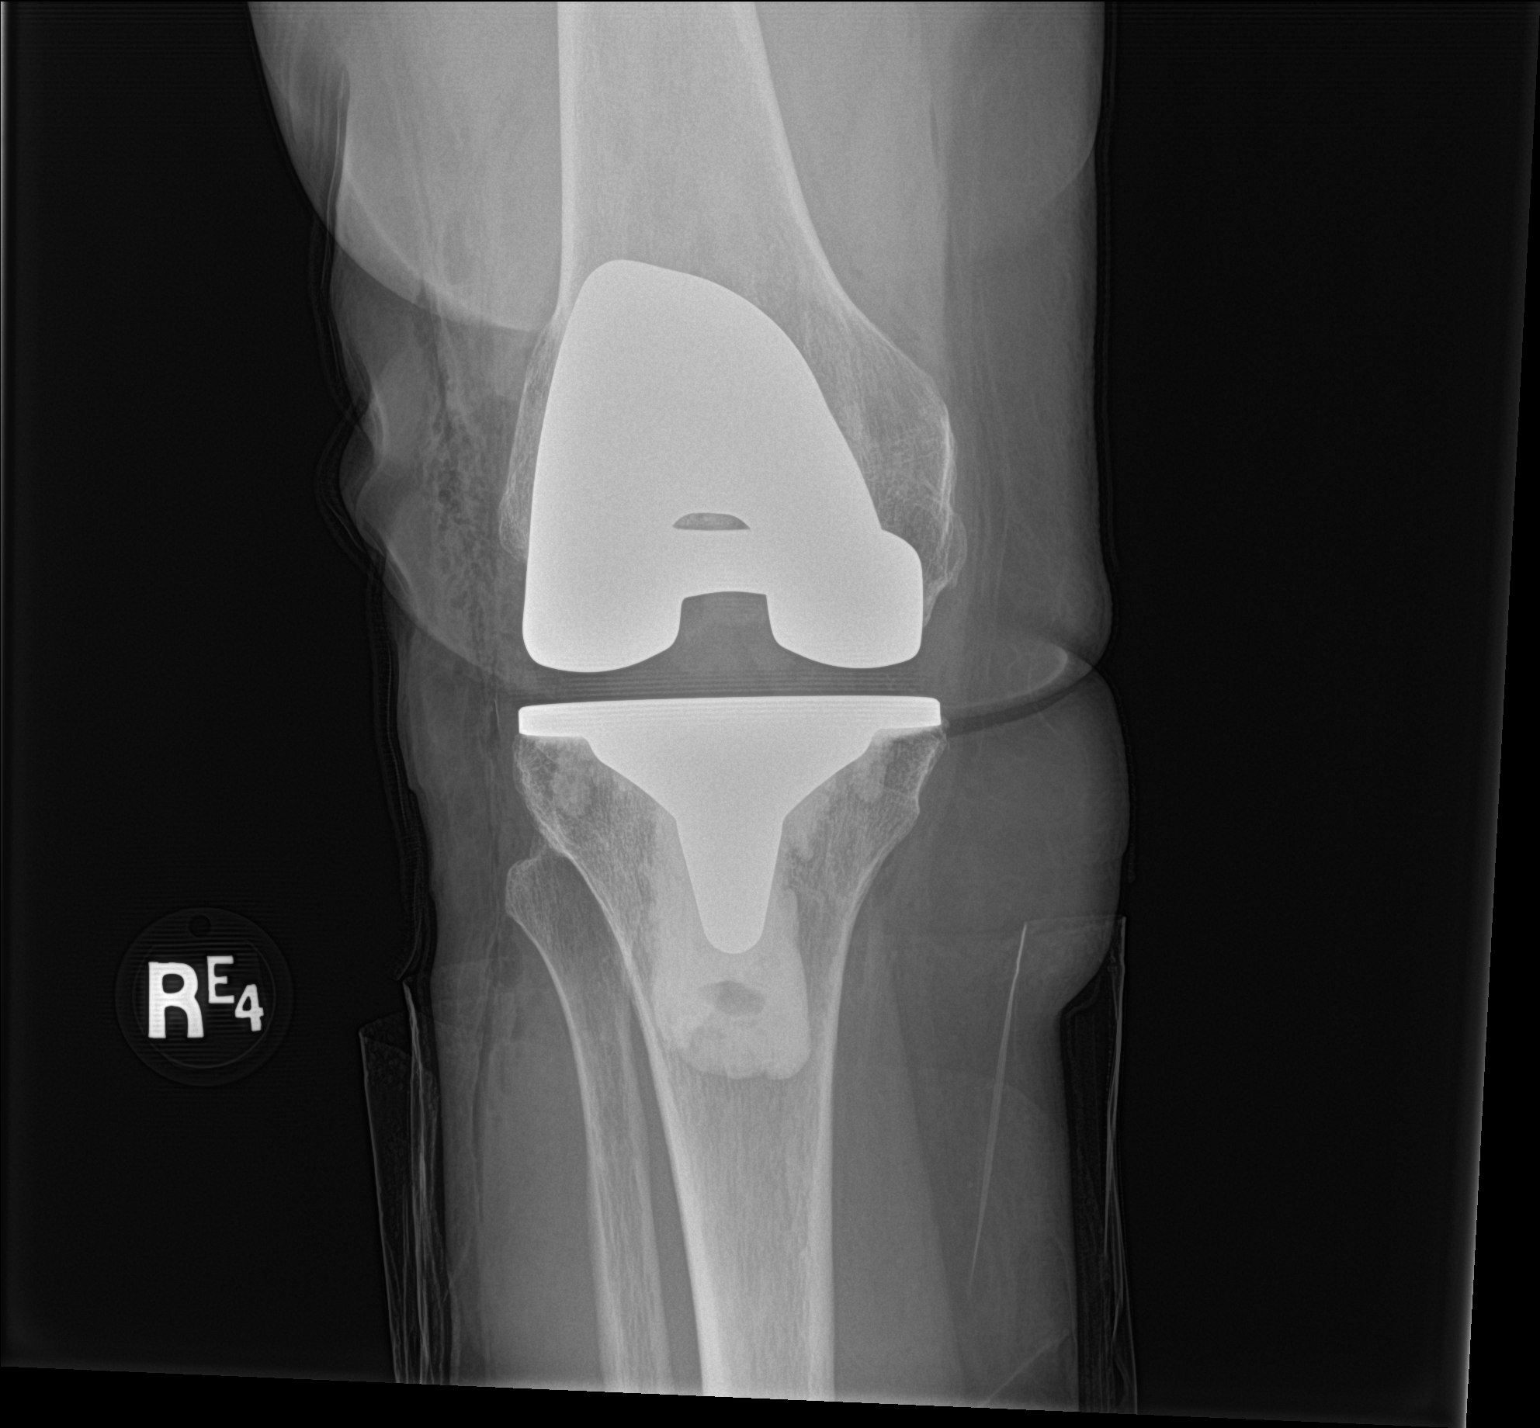

[knee lat]
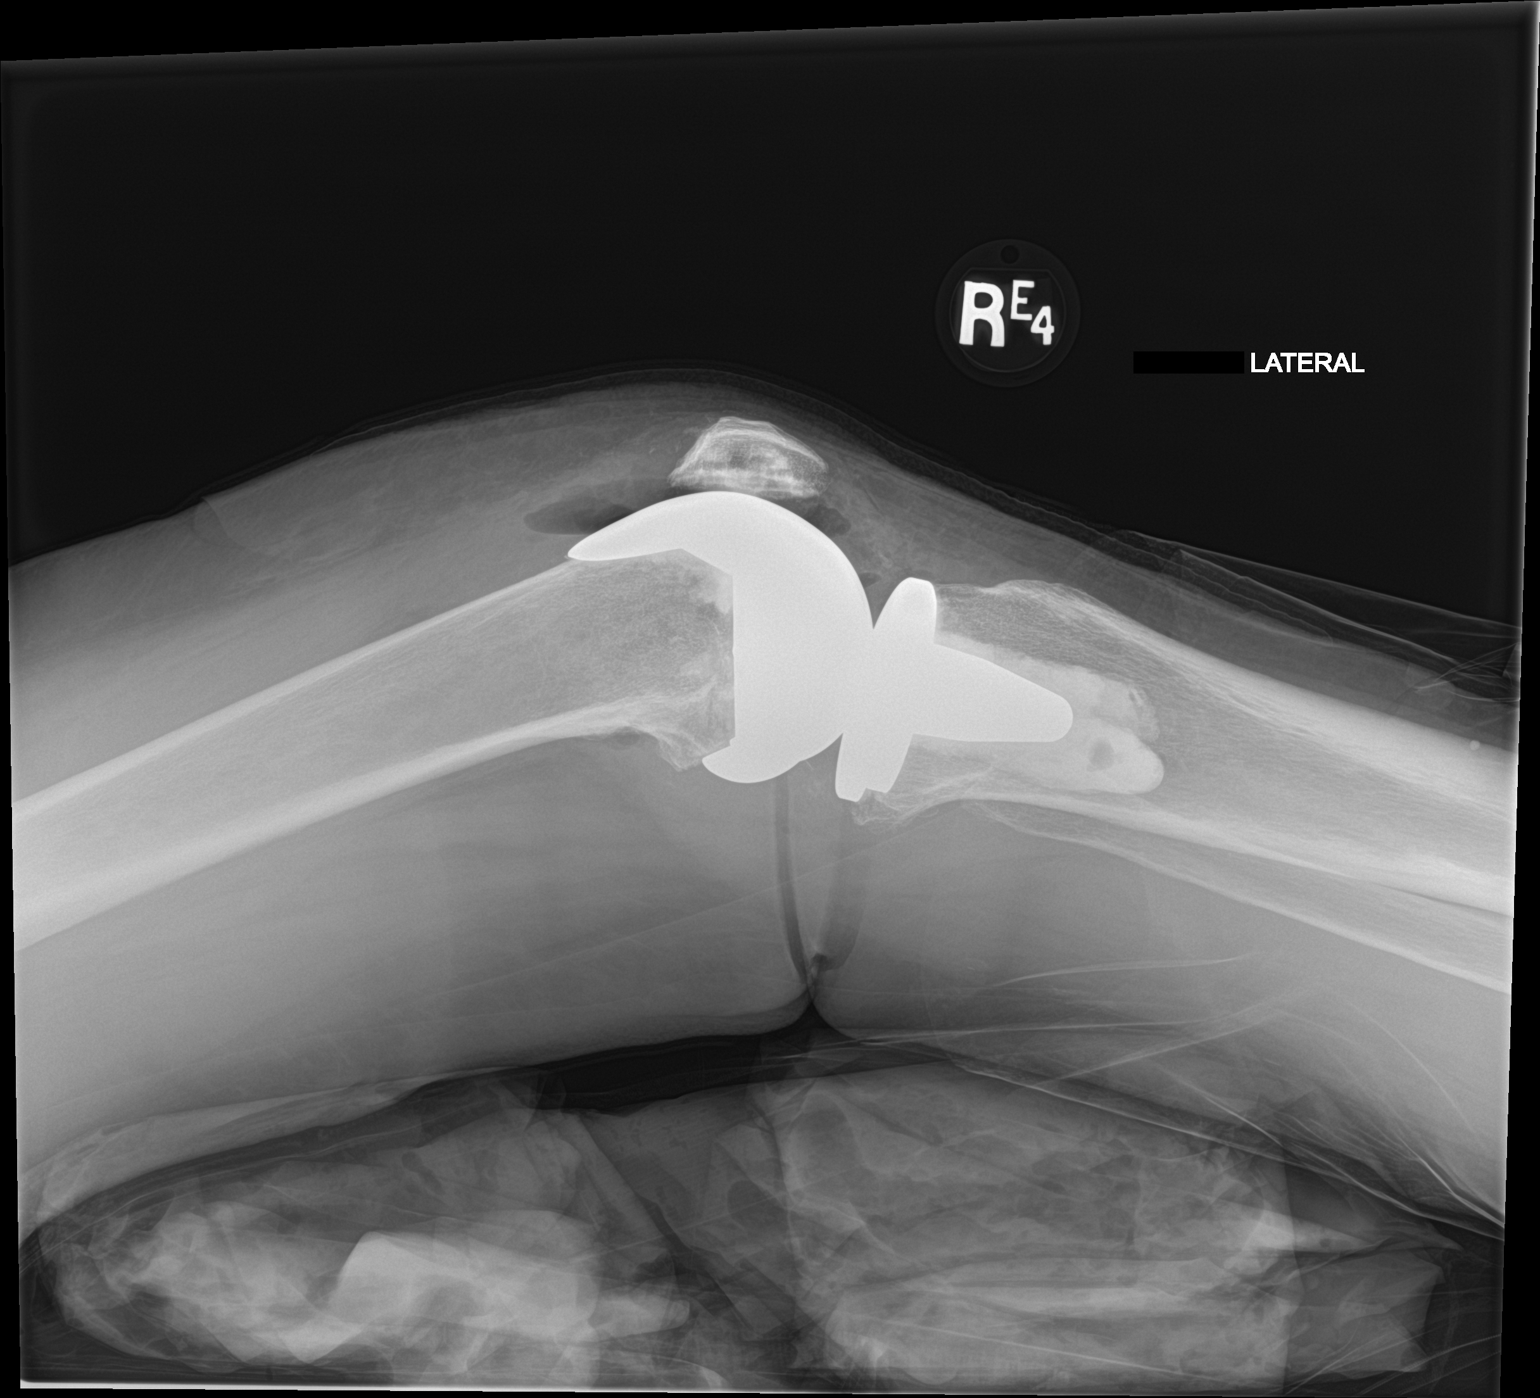

[2 of 2 positions shown; findings below may reference images not displayed]

FINDINGS: Frontal and lateral views obtained. Patient is status post total
knee replacement with prosthetic components well-seated. No fracture
or dislocation. No erosion. Air is noted within the knee joint.
IMPRESSION: Status post total knee replacement with prosthetic components
well-seated. No fracture or dislocation. Acute postoperative changes
noted.

## 2022-04-06 ENCOUNTER — Other Ambulatory Visit: Payer: Self-pay | Admitting: Family Medicine

## 2022-04-06 DIAGNOSIS — Z1231 Encounter for screening mammogram for malignant neoplasm of breast: Secondary | ICD-10-CM

## 2022-04-09 ENCOUNTER — Telehealth (INDEPENDENT_AMBULATORY_CARE_PROVIDER_SITE_OTHER): Payer: 59 | Admitting: Physician Assistant

## 2022-04-09 ENCOUNTER — Encounter: Payer: Self-pay | Admitting: Physician Assistant

## 2022-04-09 DIAGNOSIS — R413 Other amnesia: Secondary | ICD-10-CM

## 2022-04-09 DIAGNOSIS — F319 Bipolar disorder, unspecified: Secondary | ICD-10-CM

## 2022-04-09 DIAGNOSIS — F411 Generalized anxiety disorder: Secondary | ICD-10-CM

## 2022-04-09 DIAGNOSIS — F5105 Insomnia due to other mental disorder: Secondary | ICD-10-CM

## 2022-04-09 DIAGNOSIS — F429 Obsessive-compulsive disorder, unspecified: Secondary | ICD-10-CM

## 2022-04-09 DIAGNOSIS — F99 Mental disorder, not otherwise specified: Secondary | ICD-10-CM

## 2022-04-09 MED ORDER — ZIPRASIDONE HCL 40 MG PO CAPS: ORAL_CAPSULE | ORAL | 1 refills | Status: DC

## 2022-04-09 MED ORDER — CLONAZEPAM 0.5 MG PO TABS: ORAL_TABLET | ORAL | 1 refills | Status: DC

## 2022-04-09 MED ORDER — FLUOXETINE HCL 40 MG PO CAPS: 80.0000 mg | ORAL_CAPSULE | Freq: Every day | ORAL | 1 refills | Status: DC

## 2022-04-09 MED ORDER — ZIPRASIDONE HCL 80 MG PO CAPS: 160.0000 mg | ORAL_CAPSULE | Freq: Every day | ORAL | 1 refills | Status: DC

## 2022-04-09 MED ORDER — BUPROPION HCL ER (XL) 300 MG PO TB24: 300.0000 mg | ORAL_TABLET | Freq: Every day | ORAL | 1 refills | Status: DC

## 2022-04-09 MED ORDER — LAMOTRIGINE 150 MG PO TABS: 300.0000 mg | ORAL_TABLET | Freq: Every day | ORAL | 1 refills | Status: DC

## 2022-04-09 NOTE — Progress Notes (Signed)
[facility]  Patient ID: Shelley Clarke,  MRN: [id number]  PCP: Shelley Clarke., PA-C  Date of Evaluation: 04/09/2022 Time spent:20 minutes  Chief Complaint:  Chief Complaint   Anxiety; Depression; Follow-up    Virtual Visit via Telehealth  I connected with patient by a video enabled telemedicine application with their informed consent, and verified patient privacy and that I am speaking with the correct person using two identifiers.  I am private, in my office and the patient is at home.  I discussed the limitations, risks, security and privacy concerns of performing an evaluation and management service by video and the availability of in person appointments. I also discussed with the patient that there may be a patient responsible charge related to this service. The patient expressed understanding and agreed to proceed.   I discussed the assessment and treatment plan with the patient. The patient was provided an opportunity to ask questions and all were answered. The patient agreed with the plan and demonstrated an understanding of the instructions.   The patient was advised to call back or seek an in-person evaluation if the symptoms worsen or if the condition fails to improve as anticipated.  I provided 20 minutes of non-face-to-face time during this encounter.  HISTORY/CURRENT STATUS: HPI For routine med check  Shelley Clarke is doing well as far as her mental health goes. Patient is able to enjoy things.  She is cross stitching some now for fun.  Energy and motivation are fair to good most of the time.  She has been less energetic since being sick with COVID this week.  No extreme sadness, tearfulness, or feelings of hopelessness.  Sleeps well most of the time. ADLs and personal hygiene are normal.   Denies any changes in concentration, making decisions, or remembering things.  Appetite has not changed.  Weight is stable.  Denies suicidal or homicidal thoughts.  Anxiety is  very well-controlled with current medications.  No panic attacks reported.  She is not obsessing about things like she has in the past and not having compulsions either.  Patient denies increased energy with decreased need for sleep, increased talkativeness, racing thoughts, impulsivity or risky behaviors, increased spending, increased libido, grandiosity, increased irritability or anger, paranoia, or hallucinations.  Denies dizziness, syncope, seizures, numbness, tingling, tremor, tics, unsteady gait, slurred speech, confusion. Denies muscle or joint pain, stiffness, or dystonia.   Individual Medical History/ Review of Systems: Changes? :Yes     she currently has COVID.  Past medications for mental health diagnoses include: Lithium, Geodon, Wellbutrin Lamictal, Prozac, Lexapro, Klonopin, Depakote, Zoloft, Xanax caused agitation, imbalance, with increased leg/arm movements, Ambien didn't work, Luvox, Julio Alm was not helpful.  Allergies: Patient has no known allergies.  Current Medications:  Current Outpatient Medications:    albuterol (VENTOLIN HFA) 108 (90 Base) MCG/ACT inhaler, Inhale 1-2 puffs into the lungs every 6 (six) hours as needed for wheezing or shortness of breath., Disp: , Rfl:    b complex vitamins capsule, Take 1 capsule by mouth daily., Disp: , Rfl:    levothyroxine (SYNTHROID, LEVOTHROID) 100 MCG tablet, Take 100 mcg by mouth every Sunday., Disp: , Rfl:    levothyroxine (SYNTHROID, LEVOTHROID) 112 MCG tablet, Take 112 mcg by mouth See admin instructions. Take Monday -Saturday, Disp: , Rfl:    Multiple Vitamin (MULTIVITAMIN) tablet, Take 1 tablet by mouth daily., Disp: , Rfl:    Omega-3 1000 MG CAPS, Take by mouth., Disp: , Rfl:    valACYclovir (VALTREX) 500 MG  tablet, Take 500 mg by mouth at bedtime. , Disp: , Rfl:    aspirin EC 81 MG tablet, Take 1 tablet (81 mg total) by mouth 2 (two) times daily after a meal. Day after surgery (Patient taking differently: Take 81 mg by  mouth daily. Day after surgery), Disp: 60 tablet, Rfl: 1   buPROPion (WELLBUTRIN XL) 300 MG 24 hr tablet, Take 1 tablet (300 mg total) by mouth daily., Disp: 90 tablet, Rfl: 1   clonazePAM (KLONOPIN) 0.5 MG tablet, 1 po at 8 am, 1 po at 12:30 pm, 2 po at 8 pm, all prn., Disp: 360 tablet, Rfl: 1   FLUoxetine (PROZAC) 40 MG capsule, Take 2 capsules (80 mg total) by mouth daily., Disp: 180 capsule, Rfl: 1   lamoTRIgine (LAMICTAL) 150 MG tablet, Take 2 tablets (300 mg total) by mouth at bedtime., Disp: 180 tablet, Rfl: 1   polyethylene glycol (MIRALAX / GLYCOLAX) 17 g packet, Take 17 g by mouth daily mixed in 8 ounces of liquid. (Patient not taking: Reported on 04/09/2022), Disp: 14 each, Rfl: 0   ziprasidone (GEODON) 40 MG capsule, 1 in the morning, 1 at lunch, Disp: 180 capsule, Rfl: 1   ziprasidone (GEODON) 80 MG capsule, Take 2 capsules (160 mg total) by mouth at bedtime., Disp: 180 capsule, Rfl: 1 Medication Side Effects: none  Family Medical/ Social History: Changes? daughter, son-in-law and their 6 children have moved in with them.  Bailey Mech loves having them there.  MENTAL HEALTH EXAM:  There were no vitals taken for this visit.There is no height or weight on file to calculate BMI.  General Appearance: Casual, Neat and Well Groomed  Eye Contact:  Good  Speech:  Clear and Coherent and Normal Rate  Volume:  Normal  Mood:  Euthymic  Affect:  Appropriate  Thought Process:  Goal Directed and Descriptions of Associations: Circumstantial  Orientation:  Full (Time, Place, and Person)  Thought Content: Logical   Suicidal Thoughts:  No  Homicidal Thoughts:  No  Memory:  Recent;   Fair Otherwise normal for the most part.  Judgement:  Good  Insight:  Good  Psychomotor Activity:  Normal    Concentration:  Concentration: Good and Attention Span: Good  Recall:  Good  Fund of Knowledge: Good  Language: Good  Assets:  Desire for Improvement Financial  Resources/Insurance Housing Transportation Vocational/Educational  ADL's:  Intact  Cognition: WNL  Prognosis:  Good   AIMS    [place] Office Visit from 10/28/2021 in [facility] Office Visit from 02/08/2020 in [facility] Office Visit from 12/18/2019 in [facility] Office Visit from 11/20/2019 in [place] Total Score 7 21 20 22       [place] Office Visit from 10/28/2021 in [place]  Total Score (max 30 points ) [place] ED from 11/29/2021 in [facility] Emergency Department at [facility] ED from 10/22/2021 in [facility] Emergency Department at [facility] ED from 07/02/2021 in [facility] Emergency Department at [place] No Risk No Risk No Risk      Her primary provider follows labs including glucose, lipids, and EKG annually.  DIAGNOSES:    ICD-10-CM   1. Bipolar I disorder ([place])  F31.9     2. Obsessive-compulsive disorder, unspecified type  F42.9     3. Generalized anxiety  disorder  F41.1     4. Insomnia due to other mental disorder  F51.05    F99     5. Memory difficulties  R41.3       Receiving Psychotherapy: No   RECOMMENDATIONS:  PDMP was reviewed.  Last Klonopin filled 01/29/2022.  Fioricet also prescribed, by PCP. I provided 20 minutes of non-face-to-face time during this encounter, including time spent before and after the visit in records review, medical decision making, counseling pertinent to today's visit, and charting.   She is doing well as far as her mental health goes so no changes will be made.  Continue Wellbutrin XL 300 mg, 1 p.o. every morning. Continue Klonopin 0.5 mg, 1 p.o. at 8 AM, 1 p.o. at 12:30 PM, 2 p.o. at 8 PM, all as needed. Continue Prozac 40 mg, 2 p.o. daily. Continue Lamictal 150 mg, 2 p.o.  nightly. Continue Geodon 40 mg, 1 p.o. every morning and 1 p.o. at lunch. Continue Geodon 80 mg, 2 p.o. nightly. Continue vitamins as per med list. Return in 6 months.    Donnal Moat, PA-C

## 2022-04-30 ENCOUNTER — Ambulatory Visit: Payer: Medicare Other | Admitting: Physician Assistant

## 2022-05-22 ENCOUNTER — Ambulatory Visit
Admission: RE | Admit: 2022-05-22 | Discharge: 2022-05-22 | Disposition: A | Discharge status: Home / Self Care | Payer: 59 | Source: Ambulatory Visit | Attending: Family Medicine | Admitting: Family Medicine

## 2022-05-22 DIAGNOSIS — Z1231 Encounter for screening mammogram for malignant neoplasm of breast: Secondary | ICD-10-CM

## 2022-05-26 ENCOUNTER — Telehealth: Payer: Self-pay | Admitting: Family Medicine

## 2022-05-26 NOTE — Telephone Encounter (Signed)
LVM and sent mychart msg informing pt of need to reschedule 07/15/22 appt - NP out

## 2022-06-15 ENCOUNTER — Encounter: Payer: Self-pay | Admitting: Podiatry

## 2022-06-15 ENCOUNTER — Ambulatory Visit: Payer: 59 | Admitting: Podiatry

## 2022-06-15 DIAGNOSIS — M2041 Other hammer toe(s) (acquired), right foot: Secondary | ICD-10-CM

## 2022-06-15 DIAGNOSIS — M2042 Other hammer toe(s) (acquired), left foot: Secondary | ICD-10-CM

## 2022-06-15 DIAGNOSIS — L600 Ingrowing nail: Secondary | ICD-10-CM

## 2022-06-15 NOTE — Progress Notes (Signed)
  Subjective:  Patient ID: Shelley Clarke, female    DOB: 12-12-1950,   MRN: [id number]  Chief Complaint  Patient presents with   Toe Pain    Hallux left - medial border, tender for a few weeks, but had a pedicure a couple days ago and its not that sore anymore, been putting cotton under the corner of the toenail as well   New Patient (Initial Visit)    72 y.o. female presents for concern of left hallux ingrown nail that has been present for a couple weeks. Relates had a pedicure a couple days ago and has been better. Has also been using cotton. Also has some questions about hammertoes Denies any other pedal complaints. Denies n/v/f/c.   Past Medical History:  Diagnosis Date   Atypical chest pain 11/15/2017   Bipolar disorder (HCC)    Depression    Exercise-induced asthma    Family history of adverse reaction to anesthesia    Hypothyroidism    Pneumonia     Objective:  Physical Exam: Vascular: DP/PT pulses 2/4 bilateral. CFT <3 seconds. Normal hair growth on digits. No edema.  Skin. No lacerations or abrasions bilateral feet. Left hallux nail no incurvation noted currently. No erythema edema or purulence noted. No tenderness to palpation.  Musculoskeletal: MMT 5/5 bilateral lower extremities in DF, PF, Inversion and Eversion. Deceased ROM in DF of ankle joint. Bilateral digits 2-5 are hammered with most being semi rigid and some flexible.  Neurological: Sensation intact to light touch.   Assessment:   1. Ingrown left greater toenail   2. Hammertoe, bilateral      Plan:  Patient was evaluated and treated and all questions answered. Discussed ingrown toenails etiology and treatment options including procedure for removal vs conservative care.  Patient would like to continue with conservative care. Discussed trimming and using dental floss to help with the toe.  -Educated on hammertoes and treatment options  -Discussed padding including toe caps and crest pads.  -Discussed need  for potential surgery if pain does not improved.  -Patient to follow-up as needed. Discussed calling if any changes or increased pain.     Louann Sjogren, DPM

## 2022-07-07 ENCOUNTER — Other Ambulatory Visit: Payer: Self-pay | Admitting: Family Medicine

## 2022-07-07 DIAGNOSIS — E2839 Other primary ovarian failure: Secondary | ICD-10-CM

## 2022-07-15 ENCOUNTER — Ambulatory Visit: Payer: Medicare Other | Admitting: Family Medicine

## 2022-07-20 ENCOUNTER — Other Ambulatory Visit (HOSPITAL_COMMUNITY): Payer: Self-pay

## 2022-08-05 ENCOUNTER — Other Ambulatory Visit: Payer: Self-pay | Admitting: Physician Assistant

## 2022-08-13 ENCOUNTER — Encounter: Payer: 59 | Admitting: Psychology

## 2022-09-11 ENCOUNTER — Other Ambulatory Visit (HOSPITAL_COMMUNITY): Payer: Self-pay

## 2022-10-09 ENCOUNTER — Encounter: Payer: Self-pay | Admitting: Physician Assistant

## 2022-10-09 ENCOUNTER — Telehealth: Payer: 59

## 2022-10-09 ENCOUNTER — Other Ambulatory Visit: Payer: Self-pay | Admitting: Physician Assistant

## 2022-10-09 ENCOUNTER — Telehealth: Payer: Medicare Other | Admitting: Physician Assistant

## 2022-10-09 DIAGNOSIS — F319 Bipolar disorder, unspecified: Secondary | ICD-10-CM

## 2022-10-09 NOTE — Progress Notes (Signed)
Pt had an appointment schedule via telehealth, she came online. I called on the phone and spoke w/ her dtr, who said she is on another telehealth appt right now and couldn't keep the appt. Advised her to call the office to r/s but it MUST BE IN OFFICE d/t medicare rules. Her dtr relayed the message and they understand.

## 2022-10-23 ENCOUNTER — Encounter: Payer: Self-pay | Admitting: Physician Assistant

## 2022-10-23 ENCOUNTER — Ambulatory Visit (INDEPENDENT_AMBULATORY_CARE_PROVIDER_SITE_OTHER): Payer: PRIVATE HEALTH INSURANCE | Admitting: Physician Assistant

## 2022-10-23 DIAGNOSIS — F411 Generalized anxiety disorder: Secondary | ICD-10-CM

## 2022-10-23 DIAGNOSIS — F429 Obsessive-compulsive disorder, unspecified: Secondary | ICD-10-CM

## 2022-10-23 DIAGNOSIS — F319 Bipolar disorder, unspecified: Secondary | ICD-10-CM | POA: Diagnosis not present

## 2022-10-23 DIAGNOSIS — F5105 Insomnia due to other mental disorder: Secondary | ICD-10-CM

## 2022-10-23 DIAGNOSIS — F99 Mental disorder, not otherwise specified: Secondary | ICD-10-CM

## 2022-10-23 MED ORDER — LAMOTRIGINE 150 MG PO TABS: 300.0000 mg | ORAL_TABLET | Freq: Every day | ORAL | 1 refills | Status: DC

## 2022-10-23 MED ORDER — BUPROPION HCL ER (XL) 300 MG PO TB24: 300.0000 mg | ORAL_TABLET | Freq: Every day | ORAL | 1 refills | Status: DC

## 2022-10-23 MED ORDER — ZIPRASIDONE HCL 40 MG PO CAPS: ORAL_CAPSULE | ORAL | 1 refills | Status: DC

## 2022-10-23 MED ORDER — FLUOXETINE HCL 40 MG PO CAPS: 80.0000 mg | ORAL_CAPSULE | Freq: Every day | ORAL | 1 refills | Status: DC

## 2022-10-23 MED ORDER — CLONAZEPAM 0.5 MG PO TABS: ORAL_TABLET | ORAL | 1 refills | Status: DC

## 2022-10-23 MED ORDER — ZIPRASIDONE HCL 80 MG PO CAPS: 160.0000 mg | ORAL_CAPSULE | Freq: Every day | ORAL | 1 refills | Status: DC

## 2022-10-23 NOTE — Progress Notes (Signed)
[facility]  Patient ID: Shelley Clarke,  MRN: [id number]  PCP: Richmond Campbell., PA-C  Date of Evaluation: 10/23/2022 Time spent:20 minutes  Chief Complaint:  Chief Complaint   Anxiety; Depression    HISTORY/CURRENT STATUS: HPI For routine med check. Husband [name] is with her.   Doing well. Feels like meds are working well. Her daughter, son-in-law and their kids now live with her and her husband, so that's stressful. But overall it's ok.    Patient is able to enjoy things.  Energy and motivation are good.  No extreme sadness, tearfulness, or feelings of hopelessness.  Sleeps well most of the time. ADLs and personal hygiene are normal.   Denies any changes in concentration, making decisions, or remembering things.  Appetite has not changed.  Weight is stable.  Anxiety is well-controlled.  Klonopin is effective. She takes it routinely or gets panicky.  Denies suicidal or homicidal thoughts.  Patient denies increased energy with decreased need for sleep, increased talkativeness, racing thoughts, impulsivity or risky behaviors, increased spending, increased libido, grandiosity, increased irritability or anger, paranoia, or hallucinations.  Denies dizziness, syncope, seizures, numbness, tingling, tremor, tics, unsteady gait, slurred speech, confusion. Denies muscle or joint pain, stiffness, or dystonia. Denies unexplained weight loss, frequent infections, or sores that heal slowly.  No polyphagia, polydipsia, or polyuria. Denies visual changes or paresthesias.   Individual Medical History/ Review of Systems: Changes? :No     Past medications for mental health diagnoses include: Lithium, Geodon, Wellbutrin Lamictal, Prozac, Lexapro, Klonopin, Depakote, Zoloft, Xanax caused agitation, imbalance, with increased leg/arm movements, Ambien didn't work, Luvox, Allena Earing was not helpful.  Allergies: Patient has no known allergies.  Current Medications:  Current Outpatient  Medications:    albuterol (VENTOLIN HFA) 108 (90 Base) MCG/ACT inhaler, Inhale 1-2 puffs into the lungs every 6 (six) hours as needed for wheezing or shortness of breath., Disp: , Rfl:    aspirin EC 81 MG tablet, Take 1 tablet (81 mg total) by mouth 2 (two) times daily after a meal. Day after surgery (Patient taking differently: Take 81 mg by mouth daily. Day after surgery), Disp: 60 tablet, Rfl: 1   b complex vitamins capsule, Take 1 capsule by mouth daily., Disp: , Rfl:    estradiol (ESTRACE) 0.1 MG/GM vaginal cream, PLACE 1 GRAM VAGINALLY TWICE A WEEK AT BEDTIME, Disp: , Rfl:    levothyroxine (SYNTHROID, LEVOTHROID) 100 MCG tablet, Take 100 mcg by mouth every Sunday., Disp: , Rfl:    levothyroxine (SYNTHROID, LEVOTHROID) 112 MCG tablet, Take 112 mcg by mouth See admin instructions. Take Monday -Saturday, Disp: , Rfl:    Multiple Vitamin (MULTIVITAMIN) tablet, Take 1 tablet by mouth daily., Disp: , Rfl:    Omega-3 1000 MG CAPS, Take by mouth., Disp: , Rfl:    RESTASIS 0.05 % ophthalmic emulsion, 1 drop 2 (two) times daily., Disp: , Rfl:    tiZANidine (ZANAFLEX) 4 MG tablet, , Disp: , Rfl:    valACYclovir (VALTREX) 500 MG tablet, Take 500 mg by mouth at bedtime. , Disp: , Rfl:    buPROPion (WELLBUTRIN XL) 300 MG 24 hr tablet, Take 1 tablet (300 mg total) by mouth daily., Disp: 90 tablet, Rfl: 1   clonazePAM (KLONOPIN) 0.5 MG tablet, 1 po at 8 am, 1 po at 12:30 pm, 2 po at 8 pm, all prn., Disp: 360 tablet, Rfl: 1   diazepam (VALIUM) 10 MG tablet, Take by mouth. (Patient not taking: Reported on 10/23/2022), Disp: , Rfl:  FLUoxetine (PROZAC) 40 MG capsule, Take 2 capsules (80 mg total) by mouth daily., Disp: 180 capsule, Rfl: 1   lamoTRIgine (LAMICTAL) 150 MG tablet, Take 2 tablets (300 mg total) by mouth at bedtime., Disp: 180 tablet, Rfl: 1   loteprednol (LOTEMAX) 0.5 % ophthalmic suspension, 1 drop 3 (three) times daily. (Patient not taking: Reported on 10/23/2022), Disp: , Rfl:    polyethylene  glycol (MIRALAX / GLYCOLAX) 17 g packet, Take 17 g by mouth daily mixed in 8 ounces of liquid. (Patient not taking: Reported on 04/09/2022), Disp: 14 each, Rfl: 0   ziprasidone (GEODON) 40 MG capsule, TAKE 1 CAPSULE BY MOUTH IN THE MORNING AND 1 CAPSULE AT LUNCH DAILY, Disp: 180 capsule, Rfl: 1   ziprasidone (GEODON) 80 MG capsule, Take 2 capsules (160 mg total) by mouth at bedtime., Disp: 180 capsule, Rfl: 1 Medication Side Effects: none  Family Medical/ Social History: Changes? No  MENTAL HEALTH EXAM:  There were no vitals taken for this visit.There is no height or weight on file to calculate BMI.  General Appearance: Casual, Neat and Well Groomed  Eye Contact:  Good  Speech:  Clear and Coherent and Normal Rate  Volume:  Normal  Mood:  Euthymic  Affect:  Appropriate  Thought Process:  Goal Directed and Descriptions of Associations: Circumstantial  Orientation:  Full (Time, Place, and Person)  Thought Content: Logical   Suicidal Thoughts:  No  Homicidal Thoughts:  No  Memory:   stable  Judgement:  Good  Insight:  Good  Psychomotor Activity:  Normal    Concentration:  Concentration: Good and Attention Span: Good  Recall:  Good  Fund of Knowledge: Good  Language: Good  Assets:  Desire for Improvement Financial Resources/Insurance Housing Transportation Vocational/Educational  ADL's:  Intact  Cognition: WNL  Prognosis:  Good   AIMS    [place] Office Visit from 10/28/2021 in [facility] Office Visit from 02/08/2020 in [facility] Office Visit from 12/18/2019 in [facility] Office Visit from 11/20/2019 in [facility]  AIMS Total Score 7 21 20 22       Mini-Mental    Flowsheet Row Office Visit from 10/28/2021 in [facility]  Total Score (max 30 points ) 26      Flowsheet Row ED from 11/29/2021 in [facility] Emergency  Department at [facility] ED from 10/22/2021 in [facility] Emergency Department at [facility] ED from 07/02/2021 in [facility] Emergency Department at [facility]  C-SSRS RISK CATEGORY No Risk No Risk No Risk      From [facility] 07/02/2022 total cholesterol 229, triglycerides 101, HDL 75, LDL 134, random glucose 84 all other labs normal as well as EKG.    DIAGNOSES:    ICD-10-CM   1. Bipolar I disorder (HCC)  F31.9     2. Obsessive-compulsive disorder, unspecified type  F42.9     3. Generalized anxiety disorder  F41.1     4. Insomnia due to other mental disorder  F51.05    F99      Receiving Psychotherapy: No   RECOMMENDATIONS:  PDMP was reviewed.  Last Klonopin filled 08/06/2022. I provided 20  minutes of face to face time during this encounter, including time spent before and after the visit in records review, medical decision making, counseling pertinent to today's visit, and charting.   Nazarene is doing well as far as her meds are concerned  so no changes are needed.   Continue Wellbutrin XL 300 mg, 1 p.o. every morning. Continue Klonopin 0.5 mg, 1 p.o. at 8 AM, 1 p.o. at 12:30 PM, 2 p.o. at 8 PM, all as needed. Continue Prozac 40 mg, 2 p.o. daily. Continue Lamictal 150 mg, 2 p.o. nightly. Continue Geodon 40 mg, 1 p.o. every morning and 1 p.o. at lunch. Continue Geodon 80 mg, 2 p.o. nightly. Continue vitamins as per med list. Return in 6 months.    Melony Overly, PA-C

## 2022-11-13 ENCOUNTER — Telehealth: Payer: Self-pay | Admitting: Podiatry

## 2022-11-13 NOTE — Telephone Encounter (Signed)
Pt would like for the CMA to giver her a call back about her toenail removal

## 2022-11-16 ENCOUNTER — Other Ambulatory Visit: Payer: Self-pay | Admitting: Podiatry

## 2022-11-16 ENCOUNTER — Telehealth: Payer: Self-pay

## 2022-11-16 MED ORDER — DIAZEPAM 10 MG PO TABS: 10.0000 mg | ORAL_TABLET | Freq: Once | ORAL | 0 refills | Status: AC

## 2022-11-16 NOTE — Telephone Encounter (Signed)
Patient called and left a message - she is scheduled for ingrown procedure on 11/6. She is asking for a prescription to take prior -something "to calm her down" She reports that she has to have "sedation" when she goes to the dentist as well. Please advise -Thanks

## 2022-11-16 NOTE — Telephone Encounter (Signed)
I sent in one 10 mg diazepam for her to take prior to the procedure.

## 2022-11-17 ENCOUNTER — Other Ambulatory Visit: Payer: Self-pay

## 2022-11-18 ENCOUNTER — Encounter: Payer: Self-pay | Admitting: Podiatry

## 2022-11-18 ENCOUNTER — Ambulatory Visit (INDEPENDENT_AMBULATORY_CARE_PROVIDER_SITE_OTHER): Payer: Medicare Other | Admitting: Podiatry

## 2022-11-18 DIAGNOSIS — L600 Ingrowing nail: Secondary | ICD-10-CM

## 2022-11-18 NOTE — Patient Instructions (Signed)

## 2022-11-18 NOTE — Progress Notes (Signed)
  Subjective:  Patient ID: Shelley Clarke, female    DOB: April 11, 1950,   MRN: [id number]  No chief complaint on file.   72 y.o. female presents for concern of left hallux ingrown nail that has been present for a couple weeks. Relates she is ready to undergo procedure.Denies any other pedal complaints. Denies n/v/f/c.   Past Medical History:  Diagnosis Date   Atypical chest pain 11/15/2017   Bipolar disorder (HCC)    Depression    Exercise-induced asthma    Family history of adverse reaction to anesthesia    Hypothyroidism    Pneumonia     Objective:  Physical Exam: Vascular: DP/PT pulses 2/4 bilateral. CFT <3 seconds. Normal hair growth on digits. No edema.  Skin. No lacerations or abrasions bilateral feet. Left hallux nail mild incurvation noted currently. No erythema edema or purulence noted. No tenderness to palpation.  Musculoskeletal: MMT 5/5 bilateral lower extremities in DF, PF, Inversion and Eversion. Deceased ROM in DF of ankle joint. Bilateral digits 2-5 are hammered with most being semi rigid and some flexible.  Neurological: Sensation intact to light touch.   Assessment:   1. Ingrown left greater toenail      Plan:  Patient was evaluated and treated and all questions answered. Discussed ingrown toenails etiology and treatment options including procedure for removal vs conservative care.  Patient requesting removal of ingrown nail today. Procedure below.  Discussed procedure and post procedure care and patient expressed understanding.  Will follow-up in 2 weeks for nail check or sooner if any problems arise.    Procedure:  Procedure: partial Nail Avulsion of left hallux bilateral nail border.  Surgeon: Louann Sjogren, DPM  Pre-op Dx: Ingrown toenail without infection Post-op: Same  Place of Surgery: Office exam room.  Indications for surgery: Painful and ingrown toenail.    The patient is requesting removal of nail with  chemical matrixectomy. Risks and  complications were discussed with the patient for which they understand and written consent was obtained. Under sterile conditions a total of 3 mL of  1% lidocaine plain was infiltrated in a hallux block fashion. Once anesthetized, the skin was prepped in sterile fashion. A tourniquet was then applied. Next the bilateral aspect of hallux nail border was then sharply excised making sure to remove the entire offending nail border.  Next phenol was then applied under standard conditions to permanently destroy the matrix and copiously irrigated. Silvadene was applied. A dry sterile dressing was applied. After application of the dressing the tourniquet was removed and there is found to be an immediate capillary refill time to the digit. The patient tolerated the procedure well without any complications. Post procedure instructions were discussed the patient for which he verbally understood. Follow-up in two weeks for nail check or sooner if any problems are to arise. Discussed signs/symptoms of infection and directed to call the office immediately should any occur or go directly to the emergency room. In the meantime, encouraged to call the office with any questions, concerns, changes symptoms.     Louann Sjogren, DPM

## 2022-11-25 ENCOUNTER — Ambulatory Visit: Payer: Medicare Other | Admitting: Podiatry

## 2022-11-30 ENCOUNTER — Encounter: Payer: Self-pay | Admitting: Podiatry

## 2022-11-30 ENCOUNTER — Ambulatory Visit (INDEPENDENT_AMBULATORY_CARE_PROVIDER_SITE_OTHER): Payer: 59 | Admitting: Podiatry

## 2022-11-30 DIAGNOSIS — L600 Ingrowing nail: Secondary | ICD-10-CM

## 2022-11-30 NOTE — Progress Notes (Signed)
  Subjective:  Patient ID: Shelley Clarke, female    DOB: [birth year],   MRN: [id number]  Chief Complaint  Patient presents with   Nail Problem   Ingrown Toenail    72 y.o. female presents for follow-up of ingrown nail procedure. Relates doing well did have some redness and tenderness but has improved. Has been soaking as instructed . Denies any other pedal complaints. Denies n/v/f/c.   Past Medical History:  Diagnosis Date   Atypical chest pain 11/15/2017   Bipolar disorder (HCC)    Depression    Exercise-induced asthma    Family history of adverse reaction to anesthesia    Hypothyroidism    Pneumonia     Objective:  Physical Exam: Vascular: DP/PT pulses 2/4 bilateral. CFT <3 seconds. Normal hair growth on digits. No edema.  Skin. No lacerations or abrasions bilateral feet. Left hallux nail healing well.  Musculoskeletal: MMT 5/5 bilateral lower extremities in DF, PF, Inversion and Eversion. Deceased ROM in DF of ankle joint.  Neurological: Sensation intact to light touch.   Assessment:   1. Ingrown left greater toenail      Plan:  Patient was evaluated and treated and all questions answered. Toe was evaluated and appears to be healing well.  May discontinue soaks and neosporin in a week.  Patient to follow-up as needed.    Louann Sjogren, DPM

## 2023-01-12 ENCOUNTER — Telehealth: Payer: Self-pay | Admitting: Physician Assistant

## 2023-01-12 ENCOUNTER — Other Ambulatory Visit: Payer: Self-pay

## 2023-01-12 MED ORDER — CLONAZEPAM 0.5 MG PO TABS: ORAL_TABLET | ORAL | 0 refills | Status: DC

## 2023-01-12 NOTE — Telephone Encounter (Signed)
 Yes, it will be fine. I trust her. Please pend only a 1 month supply with note to pharmacy for them to fill early. Thanks.

## 2023-01-12 NOTE — Telephone Encounter (Signed)
Pt.notified

## 2023-01-12 NOTE — Telephone Encounter (Signed)
pended

## 2023-01-12 NOTE — Telephone Encounter (Signed)
Pt called an LM at 9:08am. She said they have lost a month supply of her klonopin and cannot find it anywhere. Is asking for a replacement?

## 2023-01-14 ENCOUNTER — Telehealth: Payer: Self-pay | Admitting: Physician Assistant

## 2023-01-14 NOTE — Telephone Encounter (Signed)
 Patient's spouse [name] lvm stating Trista lost her Klonopin and is in need of a 30 day supply. He also said she only had about three remaining. Rx was filled on 01/12/23. Patient last seen on 10/23/22, with a follow up scheduled for 04/22/23.

## 2023-01-14 NOTE — Telephone Encounter (Signed)
 Spoke with Shelley Clarke he stated he went to pharmacy and they didn't have the RX. I called the pharmacy, they said they have it but it was too early. I explained that Verneita put a note on t he rx on the 31st stating why she needed the rx early. Pharmacist accepted verbal otp. Let Shelley Clarke know they should be calling him soon to pu rx.

## 2023-02-17 ENCOUNTER — Other Ambulatory Visit (HOSPITAL_COMMUNITY): Payer: Self-pay

## 2023-03-18 ENCOUNTER — Ambulatory Visit
Admission: RE | Admit: 2023-03-18 | Discharge: 2023-03-18 | Disposition: A | Discharge status: Home / Self Care | Payer: Medicare Other | Source: Ambulatory Visit | Attending: Family Medicine | Admitting: Family Medicine

## 2023-03-18 ENCOUNTER — Other Ambulatory Visit: Payer: Medicare Other

## 2023-03-18 DIAGNOSIS — E2839 Other primary ovarian failure: Secondary | ICD-10-CM

## 2023-03-28 IMAGING — CT CT HEAD W/O CM
4 series · 15 of 47 positions shown, 17 images · non-contrast
Comparison: 1616

CLINICAL DATA: Head trauma, minor (Age >= 65y); Neck trauma (Age >=
65y)



[Series 2: head wo · axial · 0.39mm/px · z∈[-144,-34]mm · 7 of 30 slices shown, 9 images]
[im 4/30  brain]
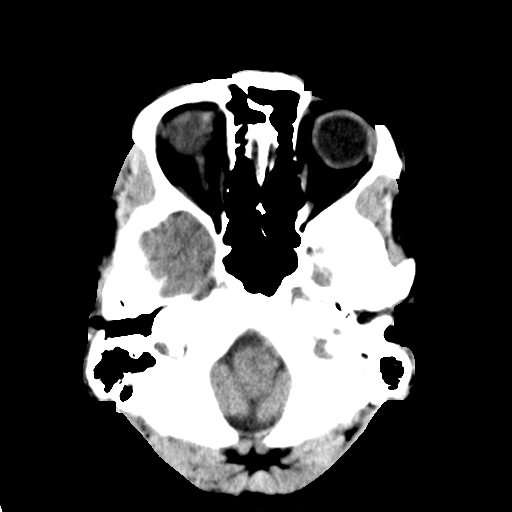
[im 4/30  bone]
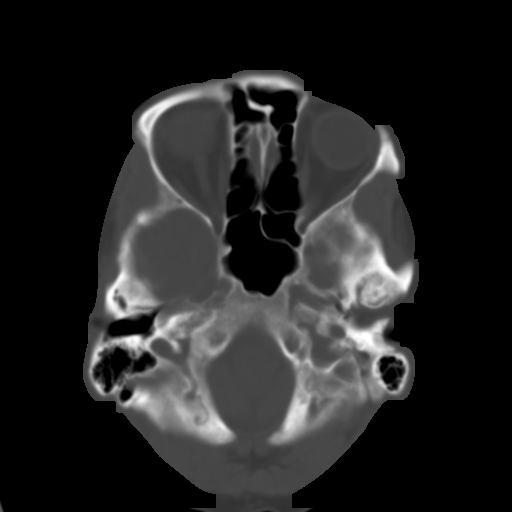
[im 8/30  brain]
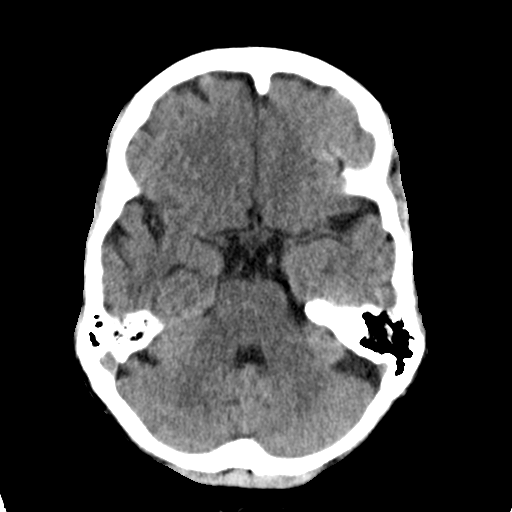
[im 11/30  brain]
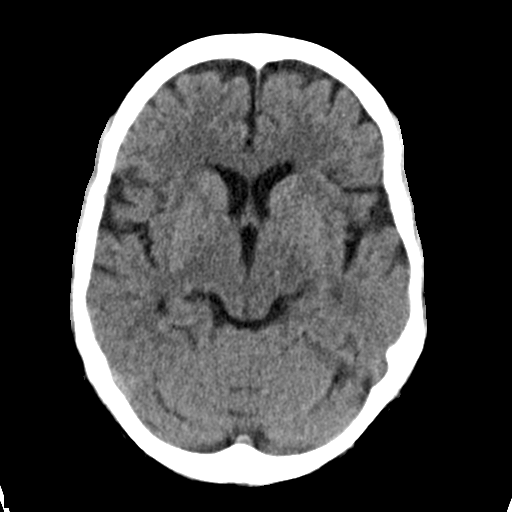
[im 15/30  brain]
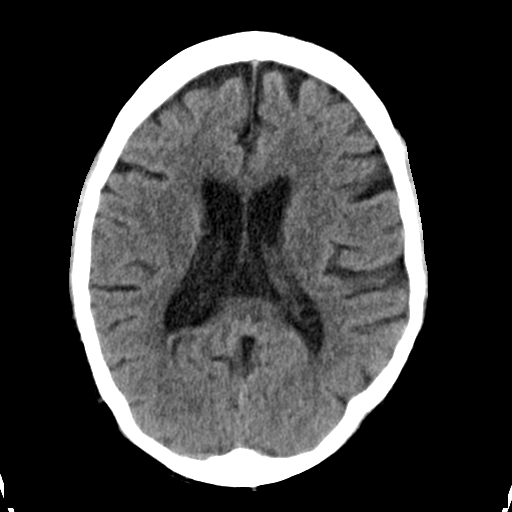
[im 19/30  brain]
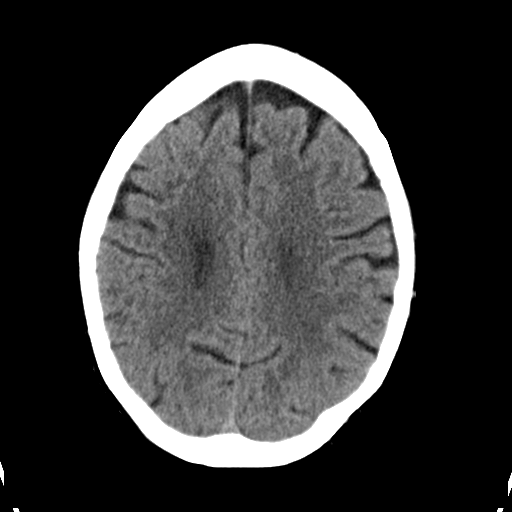
[im 19/30  bone]
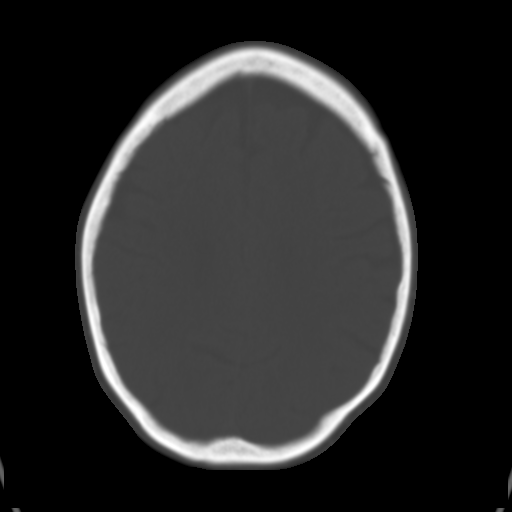
[im 22/30  brain]
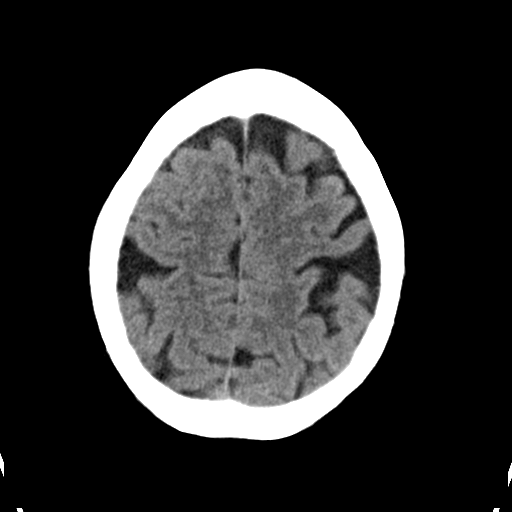
[im 26/30  brain]
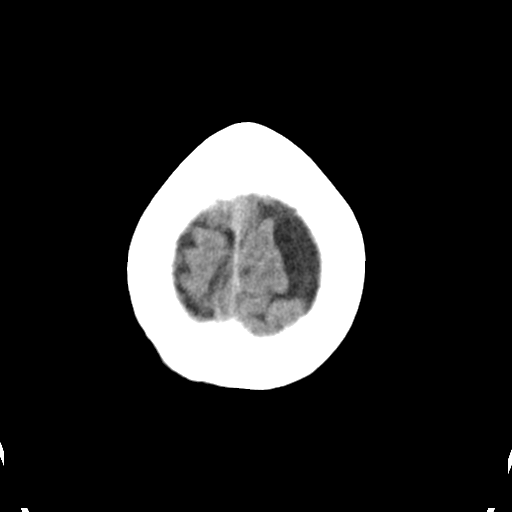

[Series 3: head bone · axial · 0.39mm/px · z∈[-145,-131]mm · 2 of 75 slices shown]
[im 8/75  bone]
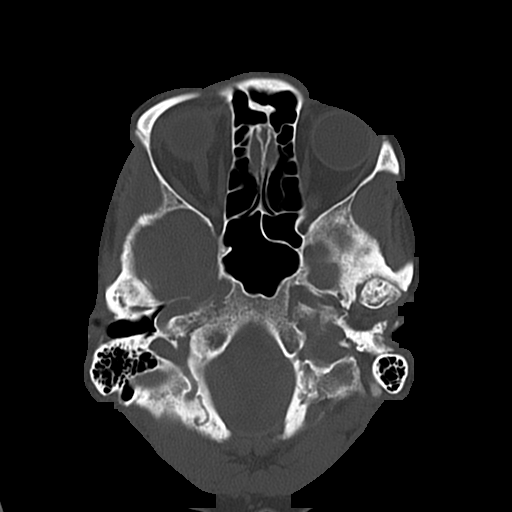
[im 15/75  bone]
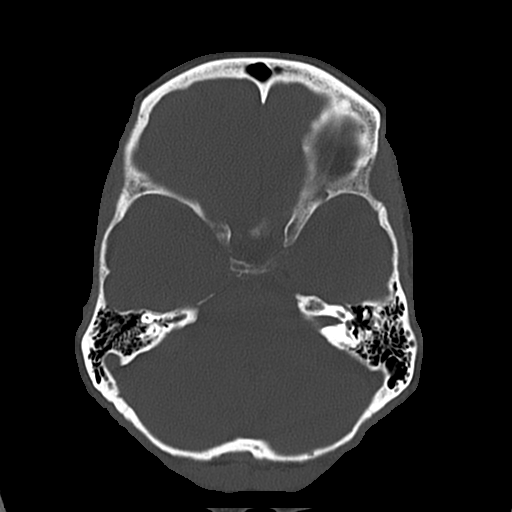

[Series 4: coronal soft · coronal · 0.29mm/px · 3 of 64 slices shown]
[im 22/64  brain]
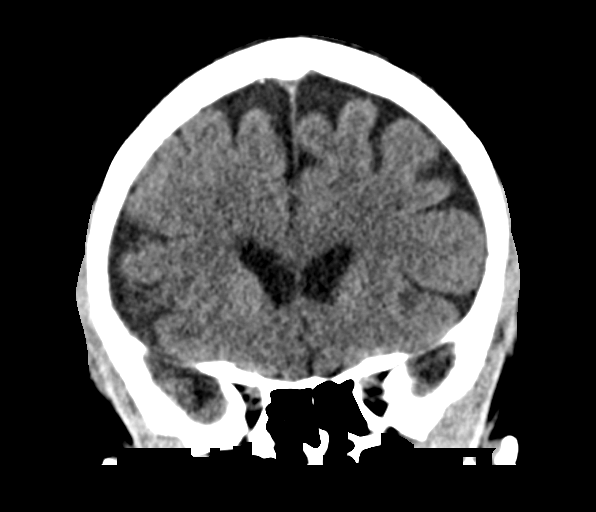
[im 29/64  brain]
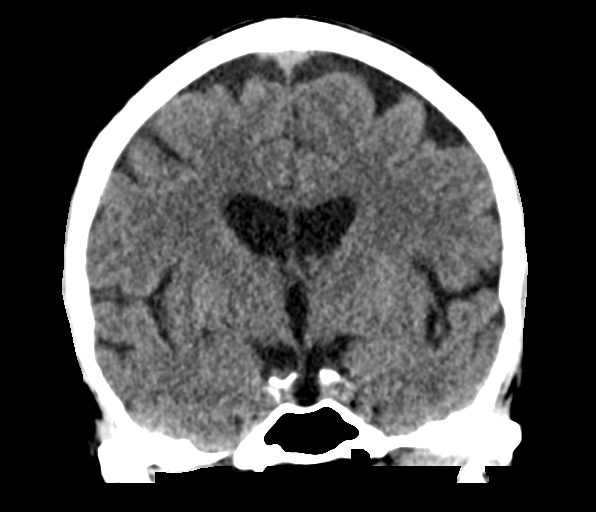
[im 36/64  brain]
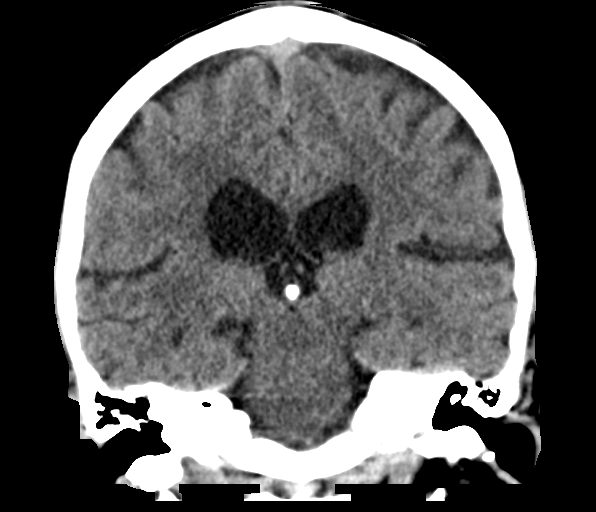

[Series 5: sagittal soft · sagittal · 0.29mm/px · 3 of 54 slices shown]
[im 18/54  brain]
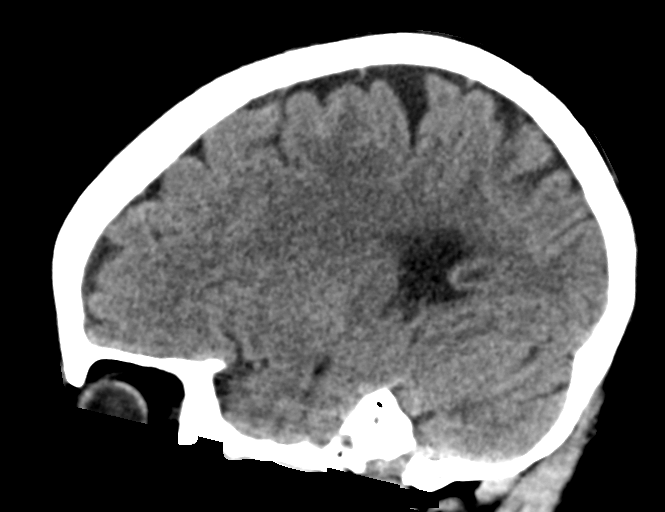
[im 27/54  brain]
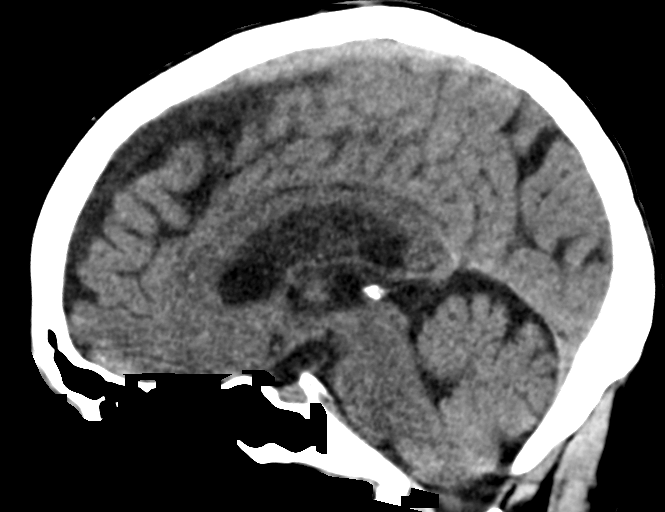
[im 36/54  brain]
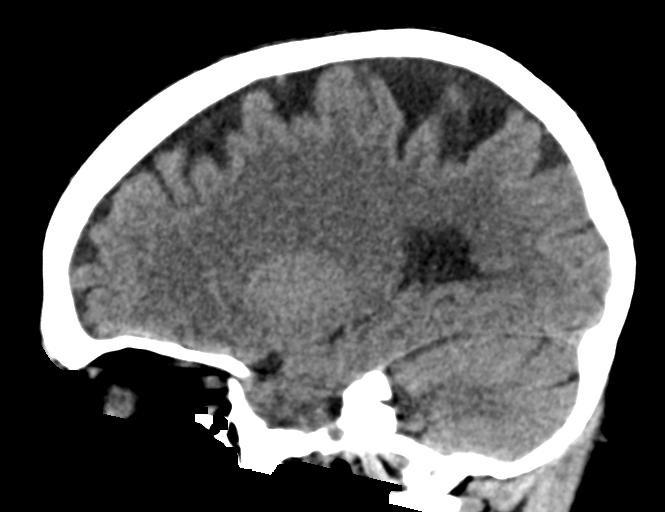

[15 of 47 positions shown; findings below may reference images not displayed]

FINDINGS: CT HEAD FINDINGS

Brain: There is no acute intracranial hemorrhage, mass effect, or
edema. Gray-white differentiation is preserved. There is no
extra-axial fluid collection. Ventricles and sulci are stable in
size and configuration.

Vascular: No hyperdense vessel or unexpected calcification.

Skull: Calvarium is unremarkable.

Sinuses/Orbits: No acute finding.

Other: None.

CT CERVICAL SPINE FINDINGS

Alignment: Stable with minor degenerative listhesis.

Skull base and vertebrae: Vertebral body heights are similar with
degenerative plate irregularity. No acute fracture.

Soft tissues and spinal canal: No prevertebral fluid or swelling. No
visible canal hematoma.

Disc levels: Multilevel degenerative changes are present including
disc space narrowing, endplate osteophytes, and facet and
uncovertebral hypertrophy. Appearance is similar to prior study.
There is foraminal greater than canal narrowing.

Upper chest: No apical lung mass.

Other: None.
IMPRESSION: No evidence of acute intracranial injury. No acute cervical spine
fracture.

## 2023-04-20 ENCOUNTER — Other Ambulatory Visit: Payer: Self-pay | Admitting: Family Medicine

## 2023-04-20 DIAGNOSIS — Z1231 Encounter for screening mammogram for malignant neoplasm of breast: Secondary | ICD-10-CM

## 2023-04-22 ENCOUNTER — Ambulatory Visit (INDEPENDENT_AMBULATORY_CARE_PROVIDER_SITE_OTHER): Payer: Medicare Other | Admitting: Physician Assistant

## 2023-04-22 ENCOUNTER — Encounter: Payer: Self-pay | Admitting: Physician Assistant

## 2023-04-22 DIAGNOSIS — F99 Mental disorder, not otherwise specified: Secondary | ICD-10-CM

## 2023-04-22 DIAGNOSIS — F5105 Insomnia due to other mental disorder: Secondary | ICD-10-CM

## 2023-04-22 DIAGNOSIS — F429 Obsessive-compulsive disorder, unspecified: Secondary | ICD-10-CM

## 2023-04-22 DIAGNOSIS — F411 Generalized anxiety disorder: Secondary | ICD-10-CM

## 2023-04-22 DIAGNOSIS — F319 Bipolar disorder, unspecified: Secondary | ICD-10-CM

## 2023-04-22 MED ORDER — ZIPRASIDONE HCL 40 MG PO CAPS: ORAL_CAPSULE | ORAL | 1 refills | Status: DC

## 2023-04-22 MED ORDER — CLONAZEPAM 0.5 MG PO TABS: ORAL_TABLET | ORAL | 5 refills | Status: DC

## 2023-04-22 MED ORDER — BUPROPION HCL ER (XL) 300 MG PO TB24: 300.0000 mg | ORAL_TABLET | Freq: Every day | ORAL | 1 refills | Status: DC

## 2023-04-22 MED ORDER — LAMOTRIGINE 150 MG PO TABS: 300.0000 mg | ORAL_TABLET | Freq: Every day | ORAL | 1 refills | Status: DC

## 2023-04-22 MED ORDER — FLUOXETINE HCL 40 MG PO CAPS: 80.0000 mg | ORAL_CAPSULE | Freq: Every day | ORAL | 1 refills | Status: DC

## 2023-04-22 MED ORDER — ZIPRASIDONE HCL 80 MG PO CAPS: 160.0000 mg | ORAL_CAPSULE | Freq: Every day | ORAL | 1 refills | Status: DC

## 2023-04-22 NOTE — Progress Notes (Signed)
 [facility]  Patient ID: Shelley Clarke,  MRN: [id number]  PCP: Lory Rough., PA-C  Date of Evaluation: 04/22/2023 Time spent:25 minutes  Chief Complaint:  Chief Complaint   Anxiety; Depression    HISTORY/CURRENT STATUS: HPI For routine med check. Husband Arvel Lather is with her.   Since our last visit, her husband had a stroke after aortic stent placed, he's getting around well, but it's been tough. Their dtr, SIL and their 6 kids live with them, and have been really helpful getting to and from appts, things like that. Shelley Clarke's mood has been stable throughout all of it.  Anxiety worse at times, but klonopin helps. No PA.  Energy and motivation are good.  No extreme sadness, tearfulness, or feelings of hopelessness.  Sleeps well most of the time. ADLs and personal hygiene are normal.   Denies any changes in concentration, making decisions, or remembering things.  Appetite has not changed.  Weight is stable.  Denies suicidal or homicidal thoughts.  Patient denies increased energy with decreased need for sleep, increased talkativeness, racing thoughts, impulsivity or risky behaviors, increased spending, increased libido, grandiosity, increased irritability or anger, paranoia, or hallucinations.  Denies dizziness, syncope, seizures, numbness, tingling, tremor, tics, unsteady gait, slurred speech, confusion. Denies muscle or joint pain, stiffness, or dystonia. Denies unexplained weight loss, frequent infections, or sores that heal slowly.  No polyphagia, polydipsia, or polyuria. Denies visual changes or paresthesias.   Individual Medical History/ Review of Systems: Changes? :No     Past medications for mental health diagnoses include: Lithium, Geodon, Wellbutrin Lamictal, Prozac, Lexapro, Klonopin, Depakote, Zoloft, Xanax caused agitation, imbalance, with increased leg/arm movements, Ambien didn't work, Luvox, Ingrezza was not helpful.  Allergies: Patient has no known  allergies.  Current Medications:  Current Outpatient Medications:    albuterol (VENTOLIN HFA) 108 (90 Base) MCG/ACT inhaler, Inhale 1-2 puffs into the lungs every 6 (six) hours as needed for wheezing or shortness of breath., Disp: , Rfl:    aspirin EC 81 MG tablet, Take 1 tablet (81 mg total) by mouth 2 (two) times daily after a meal. Day after surgery (Patient taking differently: Take 81 mg by mouth daily. Day after surgery), Disp: 60 tablet, Rfl: 1   b complex vitamins capsule, Take 1 capsule by mouth daily., Disp: , Rfl:    estradiol (ESTRACE) 0.1 MG/GM vaginal cream, PLACE 1 GRAM VAGINALLY TWICE A WEEK AT BEDTIME, Disp: , Rfl:    levothyroxine (SYNTHROID, LEVOTHROID) 100 MCG tablet, Take 100 mcg by mouth every Sunday., Disp: , Rfl:    levothyroxine (SYNTHROID, LEVOTHROID) 112 MCG tablet, Take 112 mcg by mouth See admin instructions. Take Monday -Saturday, Disp: , Rfl:    Multiple Vitamin (MULTIVITAMIN) tablet, Take 1 tablet by mouth daily., Disp: , Rfl:    Omega-3 1000 MG CAPS, Take by mouth., Disp: , Rfl:    RESTASIS 0.05 % ophthalmic emulsion, 1 drop 2 (two) times daily., Disp: , Rfl:    tiZANidine (ZANAFLEX) 4 MG tablet, , Disp: , Rfl:    valACYclovir (VALTREX) 500 MG tablet, Take 500 mg by mouth at bedtime. , Disp: , Rfl:    buPROPion (WELLBUTRIN XL) 300 MG 24 hr tablet, Take 1 tablet (300 mg total) by mouth daily., Disp: 90 tablet, Rfl: 1   clonazePAM (KLONOPIN) 0.5 MG tablet, 1 po at 8 am, 1 po at 12:30 pm, 2 po at 8 pm, all prn., Disp: 120 tablet, Rfl: 5   FLUoxetine (PROZAC) 40 MG capsule, Take 2 capsules (  80 mg total) by mouth daily., Disp: 180 capsule, Rfl: 1   lamoTRIgine (LAMICTAL) 150 MG tablet, Take 2 tablets (300 mg total) by mouth at bedtime., Disp: 180 tablet, Rfl: 1   loteprednol (LOTEMAX) 0.5 % ophthalmic suspension, 1 drop 3 (three) times daily. (Patient not taking: Reported on 04/22/2023), Disp: , Rfl:    polyethylene glycol (MIRALAX / GLYCOLAX) 17 g packet, Take 17 g by  mouth daily mixed in 8 ounces of liquid. (Patient not taking: Reported on 04/22/2023), Disp: 14 each, Rfl: 0   ziprasidone (GEODON) 40 MG capsule, TAKE 1 CAPSULE BY MOUTH IN THE MORNING AND 1 CAPSULE AT LUNCH DAILY, Disp: 180 capsule, Rfl: 1   ziprasidone (GEODON) 80 MG capsule, Take 2 capsules (160 mg total) by mouth at bedtime., Disp: 180 capsule, Rfl: 1 Medication Side Effects: none  Family Medical/ Social History: Changes? Husband had an aortic stent placed which led to 2 small strokes.   MENTAL HEALTH EXAM:  There were no vitals taken for this visit.There is no height or weight on file to calculate BMI.  General Appearance: Casual, Neat and Well Groomed  Eye Contact:  Good  Speech:  Clear and Coherent and Normal Rate  Volume:  Normal  Mood:  Euthymic  Affect:  Appropriate  Thought Process:  Goal Directed and Descriptions of Associations: Circumstantial  Orientation:  Full (Time, Place, and Person)  Thought Content: Logical   Suicidal Thoughts:  No  Homicidal Thoughts:  No  Memory:   stable  Judgement:  Good  Insight:  Good  Psychomotor Activity:   walks slowly and carefully leaning on her husband     Concentration:  Concentration: Good and Attention Span: Good  Recall:  Good  Fund of Knowledge: Good  Language: Good  Assets:  Desire for Improvement Financial Resources/Insurance Housing Transportation Vocational/Educational  ADL's:  Intact  Cognition: WNL  Prognosis:  Good   AIMS    Flowsheet Row Office Visit from 10/28/2021 in [facility] Office Visit from 02/08/2020 in [facility] Office Visit from 12/18/2019 in [facility] Office Visit from 11/20/2019 in [facility]  AIMS Total Score 7 21 20 22       Mini-Mental    Flowsheet Row Office Visit from 10/28/2021 in [facility]  Total Score (max 30 points ) 26      Flowsheet  Row ED from 11/29/2021 in [facility] Emergency Department at [facility] ED from 10/22/2021 in [facility] Emergency Department at [facility] ED from 07/02/2021 in [facility] Emergency Department at [facility]  C-SSRS RISK CATEGORY No Risk No Risk No Risk      DIAGNOSES:    ICD-10-CM   1. Bipolar I disorder (HCC)  F31.9     2. Obsessive-compulsive disorder, unspecified type  F42.9     3. Generalized anxiety disorder  F41.1     4. Insomnia due to other mental disorder  F51.05    F99       Receiving Psychotherapy: No   RECOMMENDATIONS:  PDMP was reviewed.  Last Klonopin filled 02/13/2023. I provided 25  minutes of face to face time during this encounter, including time spent before and after the visit in records review, medical decision making, counseling pertinent to today's visit, and charting.   She's doing well as far as her meds are concerned. No changes needed.   Annual labs will be done through her PCP in a  few months. EKG is done then as well.   Continue Wellbutrin XL 300 mg, 1 p.o. every morning. Continue Klonopin 0.5 mg, 1 p.o. at 8 AM, 1 p.o. at 12:30 PM, 2 p.o. at 8 PM, all as needed. Continue Prozac 40 mg, 2 p.o. daily. Continue Lamictal 150 mg, 2 p.o. nightly. Continue Geodon 40 mg, 1 p.o. every morning and 1 p.o. at lunch. Continue Geodon 80 mg, 2 p.o. nightly. Continue vitamins as per med list. Return in 6 months.    Marvia Slocumb, PA-C

## 2023-05-17 ENCOUNTER — Telehealth: Payer: Self-pay | Admitting: Physician Assistant

## 2023-05-17 NOTE — Telephone Encounter (Signed)
 Patient picked up R/x yesterday.

## 2023-05-17 NOTE — Telephone Encounter (Signed)
 Pt Lvm @ 10:19a stating that she received refill of  (120 pills) or 30 days worth of Clonzepam.  She wanted to get 360 pills (90 day supply) so she only has to pay $6.  She is asking if Ammon Bales will send in the 240 pills to    [id number] - [place], [place] - [address] [facility] [address], [place] [address] Phone: [phone]  Fax: [phone]   Next appt 10/15

## 2023-05-18 NOTE — Telephone Encounter (Signed)
 I spoke to the pt at 4:55p.  Pt picked up the 120 pills yesterday.  She is asking if Glenora Laos will put the other 240 pills on file for her to get them when she runs out in 30 days.  Or does she have to call back in 28-30 days to get the 240?

## 2023-05-19 ENCOUNTER — Encounter (HOSPITAL_COMMUNITY): Payer: Self-pay

## 2023-05-19 NOTE — Telephone Encounter (Signed)
 Told patient that a new Rx could be sent when she is due for a RF. Would send for 90 days.

## 2023-05-25 ENCOUNTER — Other Ambulatory Visit (HOSPITAL_COMMUNITY): Payer: Self-pay

## 2023-05-26 ENCOUNTER — Ambulatory Visit
Admission: RE | Admit: 2023-05-26 | Discharge: 2023-05-26 | Disposition: A | Discharge status: Home / Self Care | Source: Ambulatory Visit | Attending: Family Medicine | Admitting: Family Medicine

## 2023-05-26 DIAGNOSIS — Z1231 Encounter for screening mammogram for malignant neoplasm of breast: Secondary | ICD-10-CM

## 2023-06-14 ENCOUNTER — Telehealth: Payer: Self-pay | Admitting: Physician Assistant

## 2023-06-14 ENCOUNTER — Other Ambulatory Visit: Payer: Self-pay

## 2023-06-14 MED ORDER — CLONAZEPAM 0.5 MG PO TABS: ORAL_TABLET | ORAL | 0 refills | Status: DC

## 2023-06-14 NOTE — Telephone Encounter (Signed)
 Pt LVM @ 10:57a stating that her Klonopin  was prescribed for 120 days and it should have been 90 days.  She wants to know when the new script will be sent in?  Next appt 10/15

## 2023-06-14 NOTE — Telephone Encounter (Signed)
 Pended 90-day fill for clonazepam  with notation to cancel previous RF for 30 days to [facility] in [place].

## 2023-06-30 ENCOUNTER — Other Ambulatory Visit: Payer: Self-pay | Admitting: Physician Assistant

## 2023-07-14 ENCOUNTER — Encounter: Payer: Self-pay | Admitting: Physician Assistant

## 2023-07-15 ENCOUNTER — Ambulatory Visit: Payer: Self-pay | Admitting: Physician Assistant

## 2023-07-15 NOTE — Progress Notes (Signed)
 Results reviewed. No change in our tx.

## 2023-08-05 ENCOUNTER — Other Ambulatory Visit: Payer: Self-pay | Admitting: Physician Assistant

## 2023-09-21 ENCOUNTER — Other Ambulatory Visit (HOSPITAL_COMMUNITY): Payer: Self-pay

## 2023-10-04 ENCOUNTER — Ambulatory Visit: Admitting: Podiatry

## 2023-10-27 ENCOUNTER — Encounter: Payer: Self-pay | Admitting: Physician Assistant

## 2023-10-27 ENCOUNTER — Ambulatory Visit (INDEPENDENT_AMBULATORY_CARE_PROVIDER_SITE_OTHER): Payer: PRIVATE HEALTH INSURANCE | Admitting: Physician Assistant

## 2023-10-27 DIAGNOSIS — F5105 Insomnia due to other mental disorder: Secondary | ICD-10-CM

## 2023-10-27 DIAGNOSIS — F411 Generalized anxiety disorder: Secondary | ICD-10-CM

## 2023-10-27 DIAGNOSIS — F99 Mental disorder, not otherwise specified: Secondary | ICD-10-CM

## 2023-10-27 DIAGNOSIS — F429 Obsessive-compulsive disorder, unspecified: Secondary | ICD-10-CM

## 2023-10-27 DIAGNOSIS — F319 Bipolar disorder, unspecified: Secondary | ICD-10-CM | POA: Diagnosis not present

## 2023-10-27 DIAGNOSIS — G2401 Drug induced subacute dyskinesia: Secondary | ICD-10-CM

## 2023-10-27 MED ORDER — CLONAZEPAM 0.5 MG PO TABS: ORAL_TABLET | ORAL | 1 refills | Status: AC

## 2023-10-27 MED ORDER — ZIPRASIDONE HCL 80 MG PO CAPS: 160.0000 mg | ORAL_CAPSULE | Freq: Every day | ORAL | 1 refills | Status: AC

## 2023-10-27 MED ORDER — FLUOXETINE HCL 40 MG PO CAPS: 80.0000 mg | ORAL_CAPSULE | Freq: Every day | ORAL | 1 refills | Status: AC

## 2023-10-27 MED ORDER — LAMOTRIGINE 150 MG PO TABS: 300.0000 mg | ORAL_TABLET | Freq: Every day | ORAL | 1 refills | Status: AC

## 2023-10-27 MED ORDER — BUPROPION HCL ER (XL) 300 MG PO TB24: 300.0000 mg | ORAL_TABLET | Freq: Every day | ORAL | 1 refills | Status: AC

## 2023-10-27 MED ORDER — ZIPRASIDONE HCL 40 MG PO CAPS: ORAL_CAPSULE | ORAL | 1 refills | Status: AC

## 2023-10-27 NOTE — Progress Notes (Signed)
 [facility]  Patient ID: Shelley Clarke,  MRN: [id number]  PCP: Debrah [name] ORN., PA-C  Date of Evaluation: 10/27/2023 Time spent:30 minutes  Chief Complaint:  Chief Complaint   Anxiety; Depression; Follow-up    HISTORY/CURRENT STATUS: HPI For routine med check. Husband Taft is with her.   She's doing well, continues to respond to current treatment. Gets anxious sometimes, a little more often in the afternoon lately, not sure why.  Klonopin  helps, but sometimes she needs the afternoon dose a little earlier than usual.  Taft has been more forgetful since his CVA several months ago, she helps him with his meds so he doesn't get confused. Their daughter really helps them out when needed.   She is able to enjoy things although not cross-stitching or crafting much lately.  Not in the mood.  Energy and motivation are fair to good.  Still weak in her legs, uses walker or Rolader, sometimes neither at home, where she knows where everything is and has support.  No falls.  No extreme sadness, tearfulness, or feelings of hopelessness.  Sleeps well most of the time. ADLs and personal hygiene are normal.   Denies any changes in concentration, making decisions, or remembering things.  Appetite has not changed.  Weight is stable.  No SI/HI.  No manic sx reported, specifically increased energy with decreased need for sleep, increased talkativeness, racing thoughts, impulsivity or risky behaviors, increased spending, increased libido, grandiosity, increased irritability or anger, paranoia, or hallucinations.  Individual Medical History/ Review of Systems: Changes? :No     Past medications for mental health diagnoses include: Lithium, Geodon , Wellbutrin  Lamictal , Prozac , Lexapro, Klonopin , Depakote, Zoloft, Xanax  caused agitation, imbalance, with increased leg/arm movements, Ambien  didn't work, Luvox , Ingrezza  was not helpful.  Allergies: Patient has no known allergies.  Current  Medications:  Current Outpatient Medications:    albuterol  (VENTOLIN  HFA) 108 (90 Base) MCG/ACT inhaler, Inhale 1-2 puffs into the lungs every 6 (six) hours as needed for wheezing or shortness of breath., Disp: , Rfl:    aspirin  EC 81 MG tablet, Take 1 tablet (81 mg total) by mouth 2 (two) times daily after a meal. Day after surgery, Disp: 60 tablet, Rfl: 1   b complex vitamins capsule, Take 1 capsule by mouth daily., Disp: , Rfl:    estradiol (ESTRACE) 0.1 MG/GM vaginal cream, PLACE 1 GRAM VAGINALLY TWICE A WEEK AT BEDTIME, Disp: , Rfl:    levothyroxine  (SYNTHROID , LEVOTHROID) 100 MCG tablet, Take 100 mcg by mouth every Sunday., Disp: , Rfl:    levothyroxine  (SYNTHROID , LEVOTHROID) 112 MCG tablet, Take 112 mcg by mouth See admin instructions. Take Monday -Saturday, Disp: , Rfl:    Multiple Vitamin (MULTIVITAMIN) tablet, Take 1 tablet by mouth daily., Disp: , Rfl:    Omega-3 1000 MG CAPS, Take by mouth., Disp: , Rfl:    RESTASIS 0.05 % ophthalmic emulsion, 1 drop 2 (two) times daily., Disp: , Rfl:    tiZANidine (ZANAFLEX) 4 MG tablet, , Disp: , Rfl:    valACYclovir  (VALTREX ) 500 MG tablet, Take 500 mg by mouth at bedtime. , Disp: , Rfl:    buPROPion  (WELLBUTRIN  XL) 300 MG 24 hr tablet, Take 1 tablet (300 mg total) by mouth daily., Disp: 90 tablet, Rfl: 1   clonazePAM  (KLONOPIN ) 0.5 MG tablet, 1 po at 8 am, 1 po at 12:30 pm, 2 po at 8 pm, all prn., Disp: 360 tablet, Rfl: 1   FLUoxetine  (PROZAC ) 40 MG capsule, Take 2 capsules (80 mg  total) by mouth daily., Disp: 180 capsule, Rfl: 1   lamoTRIgine  (LAMICTAL ) 150 MG tablet, Take 2 tablets (300 mg total) by mouth at bedtime., Disp: 180 tablet, Rfl: 1   loteprednol (LOTEMAX) 0.5 % ophthalmic suspension, 1 drop 3 (three) times daily. (Patient not taking: Reported on 04/22/2023), Disp: , Rfl:    polyethylene glycol (MIRALAX  / GLYCOLAX ) 17 g packet, Take 17 g by mouth daily mixed in 8 ounces of liquid. (Patient not taking: Reported on 04/22/2023), Disp: 14  each, Rfl: 0   ziprasidone  (GEODON ) 40 MG capsule, TAKE 1 CAPSULE BY MOUTH IN THE MORNING AND 1 CAPSULE AT LUNCH DAILY, Disp: 180 capsule, Rfl: 1   ziprasidone  (GEODON ) 80 MG capsule, Take 2 capsules (160 mg total) by mouth at bedtime., Disp: 180 capsule, Rfl: 1 Medication Side Effects: none  Family Medical/ Social History: Changes?  No  MENTAL HEALTH EXAM:  There were no vitals taken for this visit.There is no height or weight on file to calculate BMI.  General Appearance: Casual, Neat and Well Groomed  Eye Contact:  Good  Speech:  Clear and Coherent and Normal Rate  Volume:  Normal  Mood:  Euthymic  Affect:  Appropriate  Thought Process:  Goal Directed and Descriptions of Associations: Circumstantial  Orientation:  Full (Time, Place, and Person)  Thought Content: Logical   Suicidal Thoughts:  No  Homicidal Thoughts:  No  Memory:  stable  Judgement:  Good  Insight:  Good  Psychomotor Activity:  She walks with a rollator and has intermittent contracture of mostly right leg with some truncal movements here and there.  No mouth or tongue movements, no abnormalities in facial expressions    Concentration:  Concentration: Good and Attention Span: Good  Recall:  Good  Fund of Knowledge: Good  Language: Good  Assets:  Desire for Improvement Financial Resources/Insurance Housing Transportation Vocational/Educational  ADL's:  Intact  Cognition: WNL  Prognosis:  Good   Labs from [facility] family medicine in [place] 07/07/2023 Total cholesterol 224, triglycerides 123, HDL 64, LDL 135 CMP completely normal TSH 1.154 Hemoglobin A1c 5.4 CBC with differential normal EKG showed normal sinus rhythm QTc interval normal   AIMS    Flowsheet Row Office Visit from 10/28/2021 in [facility] Office Visit from 02/08/2020 in [facility] Office Visit from 12/18/2019 in [facility] Office Visit from  11/20/2019 in [facility]  AIMS Total Score 7 21 20 22    Mini-Mental    Flowsheet Row Office Visit from 10/28/2021 in [facility]  Total Score (max 30 points ) 26   Flowsheet Row ED from 11/29/2021 in [facility] Emergency Department at [facility] ED from 10/22/2021 in [facility] Emergency Department at [facility] ED from 07/02/2021 in [facility] Emergency Department at [facility]  C-SSRS RISK CATEGORY No Risk No Risk No Risk   DIAGNOSES:    ICD-10-CM   1. Bipolar I disorder (HCC)  F31.9     2. Obsessive-compulsive disorder, unspecified type  F42.9     3. Generalized anxiety disorder  F41.1     4. Insomnia due to other mental disorder  F51.05    F99     5. Tardive dyskinesia  G24.01      Receiving Psychotherapy: No   RECOMMENDATIONS:  PDMP was reviewed.  Last Klonopin  filled 10/09/2023. I provided approximately 30 minutes of face to face time during this encounter, including time  spent before and after the visit in records review, medical decision making, counseling pertinent to today's visit, and charting.   She is doing well overall so no changes will be made.  We have discussed the tardive dyskinesia in the past.  It is not bothering her and she does not want to take any more medicine.  Continue Wellbutrin  XL 300 mg, 1 p.o. every morning. Continue Klonopin  0.5 mg, 1 p.o. at 8 AM, 1 p.o. at 12:30 PM, 2 p.o. at 8 PM, all as needed. Continue Prozac  40 mg, 2 p.o. daily. Continue Lamictal  150 mg, 2 p.o. nightly. Continue Geodon  40 mg, 1 p.o. every morning and 1 p.o. at lunch. Continue Geodon  80 mg, 2 p.o. nightly. Continue vitamins as per med list. Return in 6 months.    Verneita Cooks, PA-C

## 2023-11-03 ENCOUNTER — Ambulatory Visit: Admitting: Neurology

## 2023-11-03 ENCOUNTER — Telehealth: Payer: Self-pay | Admitting: Neurology

## 2023-11-03 ENCOUNTER — Encounter: Payer: Self-pay | Admitting: Neurology

## 2023-11-03 VITALS — BP 115/69 | HR 54 | Ht 63.0 in | Wt 145.0 lb

## 2023-11-03 DIAGNOSIS — M4722 Other spondylosis with radiculopathy, cervical region: Secondary | ICD-10-CM

## 2023-11-03 DIAGNOSIS — G20C Parkinsonism, unspecified: Secondary | ICD-10-CM

## 2023-11-03 DIAGNOSIS — R202 Paresthesia of skin: Secondary | ICD-10-CM

## 2023-11-03 DIAGNOSIS — M439 Deforming dorsopathy, unspecified: Secondary | ICD-10-CM | POA: Diagnosis not present

## 2023-11-03 DIAGNOSIS — M79601 Pain in right arm: Secondary | ICD-10-CM

## 2023-11-03 DIAGNOSIS — M542 Cervicalgia: Secondary | ICD-10-CM | POA: Diagnosis not present

## 2023-11-03 DIAGNOSIS — G249 Dystonia, unspecified: Secondary | ICD-10-CM

## 2023-11-03 DIAGNOSIS — M6289 Other specified disorders of muscle: Secondary | ICD-10-CM

## 2023-11-03 DIAGNOSIS — N183 Chronic kidney disease, stage 3 unspecified: Secondary | ICD-10-CM

## 2023-11-03 DIAGNOSIS — M79602 Pain in left arm: Secondary | ICD-10-CM

## 2023-11-03 DIAGNOSIS — R9389 Abnormal findings on diagnostic imaging of other specified body structures: Secondary | ICD-10-CM

## 2023-11-03 DIAGNOSIS — R269 Unspecified abnormalities of gait and mobility: Secondary | ICD-10-CM

## 2023-11-03 DIAGNOSIS — R251 Tremor, unspecified: Secondary | ICD-10-CM

## 2023-11-03 NOTE — Telephone Encounter (Signed)
MRI order sent to [place] [phone]

## 2023-11-03 NOTE — Patient Instructions (Signed)
 We will check blood work today and call you with the test results. DaT scan: This is a specialized brain scan designed to help with diagnosis of tremor disorders. A radioactive marker gets injected and the uptake is measured in the brain and compared to normal controls and right side is compared to the left, a change in uptake can help with diagnosis of certain tremor disorders. A brain MRI on the other hand is a brain scan that helps look at the brain structure in more detail overall and look for age-related changes, blood vessel related changes and look for stroke and volume loss which we call atrophy.  We will do a cervical spine (i.e. neck) MRI to look for degenerative changes and for comparison with your findings on your previous neck CT scan.   Please talk to your providers about your medication regimen and possible side effects.   Use your walker at all times and continue to stay well rested and well-hydrated, continue with therapy for now.   You may benefit from seeing a spine specialist, I would be happy to make a referral after we have more information from your neck MRI.   For now, we will keep you posted as to your test results by phone call and follow-up in this clinic accordingly.

## 2023-11-03 NOTE — Progress Notes (Signed)
 Subjective:    Patient ID: Shelley Clarke is a 73 y.o. female.  HPI    True Mar, MD, PhD [facility] [address], [address] [address] [place], [place] [address]  Dear [name],  I saw your patient, Shelley Clarke, upon your kind request in my neurologic clinic today for evaluation of her tremor and lower extremity weakness, concern for parkinsonism.  The patient is accompanied by her daughter today.  As you know, [name] is a 73 year old female with an underlying medical history of bipolar disorder, followed by psychiatry, atypical chest pain, exercise-induced asthma, hypothyroidism, arthritis with status post right knee replacement, chronic neck pain, scoliosis (per daughter), history of pneumonia, chronic kidney disease, and recurrent HSV, who reports chronic hand tremors for months or years even.  Daughter suspects that she has had a tremor for years in fact.  Sometimes her whole body is trembling.  She has had dyskinesias affecting different parts of the body, particularly her feet and legs.  She also has abnormal posture and muscle stiffness particularly in her neck and stiffness in her upper shoulder areas.  She has had abnormal posturing of her ankles.  She has not seen orthopedics for neck pain but reports that her main and most bothersome problem is stiffness and pain in her neck which radiates to the right more than left upper arm area and shoulder joint potentially although she feels that the pain is not in the joint per se.  She has limited range of motion in her neck.  She has fallen in the past, not in the last 3 to 6 months.  She lives with her husband who has Parkinson's disease and her daughter lives with them.  She uses a walker.  She has had repeated rounds of physical therapy, currently in physical therapy.  Her daughter reports that in the past 2 to 3 years her mobility has definitely declined.  Patient feels weak in her legs at times like they will give  out.  She has left knee pain.  She has a history of right total knee replacement.    She had blood work through your office on 07/08/2023 and I reviewed test results in care everywhere.  Per daughter, she has been on psychotropic medications for about 50 years.  She used to be on lithium in the past, she was on Depakote in the past.  She has a prescription for Valium  which she only takes when she goes to the dentist.  She takes clonazepam  0.5 mg strength 1 pill in the morning, 1 at lunch and 2 at night.  She is currently also on Zanaflex for muscle stiffness.  She tries to hydrate well.  She estimates that she drinks about 60 ounces of water  per day, 1 cup of coffee in the morning typically and no alcohol.  She has a tremor affecting her left more than right hand.  I reviewed your office note from 07/14/2023.  She reported no concern about her memory at the time.  She felt that she was not more forgetful than others her age.  She reported neck pain.  She reported a tremor in her left hand for the past several months.  She reported feeling weak and unable to walk longer distances.  She reported falls.  She has been in physical therapy outpatient.  Of note, she is on multiple psychotropic medications including Geodon , Lamictal , clonazepam , bupropion , fluoxetine , Valium , and Zanaflex.  She had a head CT without contrast and  cervical spine CT without contrast through [facility] health emergency department at [facility] on 10/22/2021 and I reviewed the results:   IMPRESSION: 1. No evidence for acute intracranial abnormality. 2. Mild brain atrophy. 3. No evidence for cervical spine fracture or subluxation. 4. Cervical degenerative disc disease.   In addition, I personally reviewed CT head images through the PACS system. She had been evaluated by Dr. Rush in this office nearly 2 years ago for memory loss and tremor.  I reviewed the office visit note and copied the note below for reference. She reported a  left hand tremor for several years and a tremor in her legs at the time as well as a sensation of restlessness in her legs.  Her TSH was slightly below normal at the time.  Her MoCA was 25/30 at the time.  She was referred to neuropsychology.  She did not have the evaluation.  She was suspected to have medication induced tremors due to long-term use of Geodon  and Lamictal .  Patient declined a DaTscan at that time.  Her vitamin B12 was elevated at 1255 at the time.  Previously:  12/29/2021 (Dr. Delon Rush): <<The patient presents for evaluation of memory loss which has been present over several years. Per documentation review she had reported memory issues as far back as 2012. She reports worsening forgetfulness over time. Has to take frequent notes to remember things. She will lose track of ingredients while cooking. Will forget what the preacher says in church.   She had a mechanical fall and hit her head in October 2023. CTH in the ED showed mild brain atrophy, no acute process. MMSE done through her Psychiatrist later that month was 26/30, within normal limits. She also had an MRI brain done in 2019 which showed mild volume loss but was otherwise unremarkable.   She reports a tremor in her legs and L>R hand which has been present for several years. She also has a sensation of restlessness in her legs. She was told this was likely medication induced (takes Lamictal  and Geodon  for bipolar disorder). She can suppress the tremor if she focuses on it. Balance has felt off since a hospitalization for pneumonia and hypoxia in 2019. Has gone to PT multiple times which has been helpful, but she does not consistently do the exercises or use her walker. Last went to PT a few months ago.   TSH drawn earlier this week was slightly low (0.41).   TBI: [place] and hit her head 2 months ago, CTH was negative for acute process Stroke:  no past history of stroke Seizures:  no past history of seizures Sleep:  no  history of sleep apnea. 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 Has never left the stove on Driving: she is not driving Bills: Handles her own finances but will sometimes lose track of the money in her accounts. Has trouble controlling her spending due to her bipolar disorder Medications: Husband helps organize her medications Ever left the stove on by accident?: no Forgetting loved ones names?: no   OTHER MEDICAL CONDITIONS: bipolar disorder, hypothyroidism, CKD, tardive dyskinesia >>  Her Past Medical History Is Significant For: Past Medical History:  Diagnosis Date   Atypical chest pain 11/15/2017   Bipolar disorder (HCC)    Depression    Exercise-induced asthma    Family history of adverse reaction  to anesthesia    Hypothyroidism    Pneumonia     Her Past Surgical History Is Significant For: Past Surgical History:  Procedure Laterality Date   ABDOMINAL HYSTERECTOMY     BREAST EXCISIONAL BIOPSY Left 2008   CESAREAN SECTION     times 2   TOTAL KNEE ARTHROPLASTY Right 07/10/2020   Procedure: TOTAL KNEE ARTHROPLASTY;  Surgeon: Duwayne Purchase, MD;  Location: [facility];  Service: Orthopedics;  Laterality: Right;    Her Family History Is Significant For: Family History  Problem Relation Age of Onset   Breast cancer Mother 46   Hypertension Mother    Diabetes Mother    Neuropathy Mother    Heart attack Father    Graves' disease Sister    Brain cancer Maternal Grandmother    Kidney disease Maternal Grandfather        Bright's disease   Stroke Paternal Grandmother    Endometriosis Child    Hypertension Child    Parkinson's disease Neg Hx     Her Social History Is Significant For: Social History   Socioeconomic History   Marital status: Married    Spouse name: Not on file   Number of children: Not on file   Years of  education: Not on file   Highest education level: Not on file  Occupational History   Not on file  Tobacco Use   Smoking status: Never   Smokeless tobacco: Never   Tobacco comments:    Pt states takes CBD with gelatin   Vaping Use   Vaping status: Never Used  Substance and Sexual Activity   Alcohol use: Not Currently   Drug use: Never   Sexual activity: Not on file  Other Topics Concern   Not on file  Social History Narrative   Pt lives with family    Retired    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 : Never true  Transportation Needs: Not on file (06/29/2022)  Physical Activity: Unknown (06/14/2018)   Received from [facility] visits prior to 03/14/2022.   Exercise Vital Sign    On average, how many days per week do you engage in moderate to strenuous exercise (like a brisk walk)?: 5 days    Minutes of Exercise per Session: Not on file  Stress: Not on file  Social Connections: Unknown (06/14/2018)   Received from [facility] visits prior to 03/14/2022.   Social Connection and Isolation Panel    In a typical week, how many times do you talk on the phone with family, friends, or neighbors?: More than three times a week    Frequency of Social Gatherings with Friends and Family: Not on file    Attends Religious Services: Not on file    Do you belong to any clubs or organizations such as church groups, unions, fraternal or athletic groups, or school groups?: Yes    How often do you attend meetings of the clubs or organizations you belong to?: More than 4 times per year    Are you married, widowed,  divorced,  separated, never married, or living with a partner?: Married    Her Allergies Are:  No Known Allergies:   Her Current Medications Are:  Outpatient Encounter Medications as of 11/03/2023  Medication Sig   albuterol  (VENTOLIN  HFA) 108 (90 Base) MCG/ACT inhaler Inhale 1-2 puffs into the lungs every 6 (six) hours as needed for wheezing or shortness of breath.   aspirin  EC 81 MG tablet Take 1 tablet (81 mg total) by mouth 2 (two) times daily after a meal. Day after surgery   b complex vitamins capsule Take 1 capsule by mouth daily.   buPROPion  (WELLBUTRIN  XL) 300 MG 24 hr tablet Take 1 tablet (300 mg total) by mouth daily.   clonazePAM  (KLONOPIN ) 0.5 MG tablet 1 po at 8 am, 1 po at 12:30 pm, 2 po at 8 pm, all prn.   estradiol (ESTRACE) 0.1 MG/GM vaginal cream PLACE 1 GRAM VAGINALLY TWICE A WEEK AT BEDTIME   FLUoxetine  (PROZAC ) 40 MG capsule Take 2 capsules (80 mg total) by mouth daily.   lamoTRIgine  (LAMICTAL ) 150 MG tablet Take 2 tablets (300 mg total) by mouth at bedtime.   levothyroxine  (SYNTHROID , LEVOTHROID) 100 MCG tablet Take 100 mcg by mouth every Sunday.   levothyroxine  (SYNTHROID , LEVOTHROID) 112 MCG tablet Take 112 mcg by mouth See admin instructions. Take Monday -Saturday   Multiple Vitamin (MULTIVITAMIN) tablet Take 1 tablet by mouth daily.   Omega-3 1000 MG CAPS Take by mouth.   RESTASIS 0.05 % ophthalmic emulsion 1 drop 2 (two) times daily.   tiZANidine (ZANAFLEX) 4 MG tablet    valACYclovir  (VALTREX ) 500 MG tablet Take 500 mg by mouth at bedtime.    ziprasidone  (GEODON ) 40 MG capsule TAKE 1 CAPSULE BY MOUTH IN THE MORNING AND 1 CAPSULE AT LUNCH DAILY   ziprasidone  (GEODON ) 80 MG capsule Take 2 capsules (160 mg total) by mouth at bedtime.   loteprednol (LOTEMAX) 0.5 % ophthalmic suspension 1 drop 3 (three) times daily. (Patient not taking: Reported on 04/22/2023)   polyethylene glycol (MIRALAX  / GLYCOLAX ) 17 g packet Take 17 g by mouth daily mixed in 8 ounces of liquid. (Patient  not taking: Reported on 04/22/2023)   No facility-administered encounter medications on file as of 11/03/2023.  :   Review of Systems:  Out of a complete 14 point review of systems, all are reviewed and negative with the exception of these symptoms as listed below:          Review of Systems  Objective:  Neurological Exam  Physical Exam Physical Examination:   Vitals:   11/03/23 1058  BP: 115/69  Pulse: (!) 54    General Examination: The patient is a very pleasant 73 y.o. female in no acute distress.  She is mildly anxious appearing.  She appears frail and deconditioned.  She is well-groomed.   HEENT: Normocephalic, atraumatic, pupils are equal, round and reactive to light, extraocular tracking is mildly impaired, no nystagmus, no photophobia.  Hearing is grossly intact.  Face is symmetric with mild facial masking, moderate nuchal rigidity noted, limitation of neck movements passively and actively.  She has no carotid bruits.  Airway examination reveals no lingual dyskinesias.  Tongue protrudes centrally.  Moderate mouth dryness noted.    Chest: Clear to auscultation without wheezing, rhonchi or crackles noted.  Heart: S1+S2+0, regular and normal without murmurs, rubs or gallops noted.  Mild bradycardia noted.  Abdomen: Soft, non-tender and non-distended.  Extremities: There is no pitting edema in the distal lower extremities  bilaterally.   Skin: Warm and dry without trophic changes noted.   Musculoskeletal: exam reveals hammertoes both feet, dyskinesias in both distal lower extremities, abnormal posture of both ankles, scoliosis and abnormal upper back curvature noted while seated and while walking.  Arthritic changes in both hands.  Left knee pain.  Status post right total knee replacement.  Neurologically:  Mental status: The patient is awake, alert and oriented in all 4 spheres. Her immediate and remote memory, attention, language skills and fund of knowledge are  appropriate.  History is supplemented by her daughter.  There is no evidence of aphasia, agnosia, apraxia or anomia. Speech is clear with normal prosody and enunciation. Thought process is linear. Mood is constricted and affect is blunted.  Cranial nerves II - XII are as described above under HEENT exam.  Motor exam: Normal bulk, strength is good, in the 5 out of 5 range, increased tone in all 4 extremities noted, no telltale cogwheeling.  She has a slight intermittent resting tremor in the right upper extremity, mild intermittent resting tremor in the left upper extremity, intermittent trembling in the feet as well.  Dyskinesias noted in the distal lower extremities bilaterally.    Fine motor skills and coordination: Mild to moderate difficulty with fine motor skills in the upper and lower extremities.  Reflexes are 1+ in the upper extremities, trace in the left knee, absent in the right knee, and absent in both ankles. Cerebellar testing: No dysmetria or intention tremor. There is no truncal or gait ataxia.  Sensory exam: intact to light touch in the upper and lower extremities.  Gait, station and balance: She stands with difficulty and pushes herself up.  She requires mild assistance.  She walks with a 4 wheeled walker, takes smaller steps, almost scissoring like, no telltale shuffling, difficulty with turns.  Balance is impaired.   Assessment and Plan:  In summary, TAMIKKA PILGER is a 73 year old female with an underlying medical history of bipolar disorder, followed by psychiatry, atypical chest pain, exercise-induced asthma, hypothyroidism, arthritis with status post right knee replacement, chronic neck pain, scoliosis (per daughter), history of pneumonia, chronic kidney disease, and recurrent HSV, who presents for evaluation of her gait disorder, tremors, involuntary movements, neck pain.  Symptoms have been ongoing for months and tremor has been ongoing for years.  There is concern for drug-induced  tremor and parkinsonism as well as dyskinesia secondary to psychotropic medications particularly the Geodon .  We had a long discussion today.  Her CT neck from 2023 did indicate degenerative changes in her neck and she could definitely have symptoms of cervical radiculopathy.  She has not seen orthopedics or a spine specialist for the symptoms and is encouraged to seek consultation, I would be happy to make a referral if need be.  We can go ahead with a cervical spine MRI.  I also recommend a DaTscan to further delineate her parkinsonism as she does have a resting tremor that is more noticeable on the left than right but other than that she does not have any telltale lateralization of her findings.  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  I recommend proceeding with a DaTscan.  We will check some labs today including kidney function as she has a history of chronic kidney disease and last labs were nearly 4 months ago.  We will also check vitamin B12, vitamin D and TSH today.  We will keep her posted as to her test results by phone call  for now and follow-up in this clinic accordingly.  We talked about the importance of fall prevention, maintaining a healthy lifestyle, good hydration with water , utilizing her walker at all times.  She is currently in physical therapy.  She is cautioned regarding certain medications that can increase her fall risk and increased balance problems, these medications include tizanidine, clonazepam , Geodon , and Lamictal .  She is encouraged to have close follow-up with your office and her psychiatrist.  I answered all the questions today and the patient and her daughter were in agreement.  This was an extended visit of over 70 minutes of high complexity with copious record review involved, addressing multiple problems, and considerable counseling and coordination of care, comprehensive exam and explanation of pending tests and previous test  results.  Below is a summary of my recommendations and our discussion points from today's visit, based on chart review, history and examination. They were given these instructions verbally during the visit in detail and also in writing in the MyChart after visit summary (AVS), which they can access electronically. << We will check blood work today and call you with the test results. DaT scan: This is a specialized brain scan designed to help with diagnosis of tremor disorders. A radioactive marker gets injected and the uptake is measured in the brain and compared to normal controls and right side is compared to the left, a change in uptake can help with diagnosis of certain tremor disorders. A brain MRI on the other hand is a brain scan that helps look at the brain structure in more detail overall and look for age-related changes, blood vessel related changes and look for stroke and volume loss which we call atrophy.  We will do a cervical spine (i.e. neck) MRI to look for degenerative changes and for comparison with your findings on your previous neck CT scan.   Please talk to your providers about your medication regimen and possible side effects.   Use your walker at all times and continue to stay well rested and well-hydrated, continue with therapy for now.   You may benefit from seeing a spine specialist, I would be happy to make a referral after we have more information from your neck MRI.   For now, we will keep you posted as to your test results by phone call and follow-up in this clinic accordingly.   >>    Thank you very much for allowing me to participate in the care of this nice patient. If I can be of any further assistance to you please do not hesitate to call me at [phone].  Sincerely,   True Mar, MD, PhD

## 2023-11-04 ENCOUNTER — Telehealth: Payer: Self-pay | Admitting: Neurology

## 2023-11-04 LAB — COMPREHENSIVE METABOLIC PANEL WITH GFR
ALT: 13 IU/L (ref 0–32)
AST: 21 IU/L (ref 0–40)
Albumin: 4.5 g/dL (ref 3.8–4.8)
Alkaline Phosphatase: 77 IU/L (ref 49–135)
BUN/Creatinine Ratio: 14 (ref 12–28)
BUN: 15 mg/dL (ref 8–27)
Bilirubin Total: <0.2 mg/dL (ref 0.0–1.2)
CO2: 25 mmol/L (ref 20–29)
Calcium: 10.1 mg/dL (ref 8.7–10.3)
Chloride: 95 mmol/L — ABNORMAL LOW (ref 96–106)
Creatinine, Ser: 1.07 mg/dL — ABNORMAL HIGH (ref 0.57–1.00)
Globulin, Total: 1.9 g/dL (ref 1.5–4.5)
Glucose: 84 mg/dL (ref 70–99)
Potassium: 4.6 mmol/L (ref 3.5–5.2)
Sodium: 137 mmol/L (ref 134–144)
Total Protein: 6.4 g/dL (ref 6.0–8.5)
eGFR: 56 mL/min/1.73 — ABNORMAL LOW (ref >59)

## 2023-11-04 LAB — VITAMIN D 25 HYDROXY (VIT D DEFICIENCY, FRACTURES): Vit D, 25-Hydroxy: 65.3 ng/mL (ref 30.0–100.0)

## 2023-11-04 LAB — TSH: TSH: 3.36 u[IU]/mL (ref 0.450–4.500)

## 2023-11-04 LAB — B12 AND FOLATE PANEL
Folate: >20 ng/mL (ref >3.0)
Vitamin B-12: 1159 pg/mL (ref 232–1245)

## 2023-11-04 NOTE — Telephone Encounter (Signed)
 The dat scan is scheduled November 4 and the MRI is scheduled November 5.

## 2023-11-04 NOTE — Telephone Encounter (Signed)
 Per Dr Buck, no preference in order, just not on same day.

## 2023-11-04 NOTE — Telephone Encounter (Signed)
 Spoke with patient and let her know. Pt said she had misunderstood and thought MRI would be done first and then possibly DaTscan. She said Jolynn Pack called her yesterday to schedule DaTscan but she held off. She is now asking for us  to let Jolynn Pack know to call her back so she can schedule. I let her know I would pass this to the coordinator. She was appreciative.

## 2023-11-04 NOTE — Telephone Encounter (Signed)
 Pt is asking for clarity, does Dr Buck want her to have her DAT scan before MRI or does it not matter, please advise.

## 2023-11-05 ENCOUNTER — Ambulatory Visit: Payer: Self-pay | Admitting: Neurology

## 2023-11-05 DIAGNOSIS — M542 Cervicalgia: Secondary | ICD-10-CM

## 2023-11-05 DIAGNOSIS — R9389 Abnormal findings on diagnostic imaging of other specified body structures: Secondary | ICD-10-CM

## 2023-11-05 DIAGNOSIS — M4802 Spinal stenosis, cervical region: Secondary | ICD-10-CM

## 2023-11-05 DIAGNOSIS — M4722 Other spondylosis with radiculopathy, cervical region: Secondary | ICD-10-CM

## 2023-11-08 NOTE — Telephone Encounter (Signed)
 Lvm 1st attempt by hf 11/08/23

## 2023-11-08 NOTE — Telephone Encounter (Signed)
-----   Message from Shelley Clarke sent at 11/05/2023  2:59 PM EDT ----- Call patient and advise her that her kidney function is mildly below ideal but I do not see a reason to avoid a DAT scan so long as she continues to hydrate well with water .  Mild reduction in kidney  function appears to be stable currently.  Her other labs were fine including thyroid  screening test, vitamin D level, and vitamin B12 and folate levels. ----- Message ----- From: Rebecka Memos Lab Results In Sent: 11/04/2023   7:38 AM EDT To: Shelley Mar, MD

## 2023-11-08 NOTE — Telephone Encounter (Signed)
 Pt has returned call to CMA, she is asking for a call back

## 2023-11-10 ENCOUNTER — Encounter: Payer: Self-pay | Admitting: Neurology

## 2023-11-16 ENCOUNTER — Encounter (HOSPITAL_COMMUNITY)
Admission: RE | Admit: 2023-11-16 | Discharge: 2023-11-16 | Disposition: A | Discharge status: Home / Self Care | Source: Ambulatory Visit | Attending: Neurology | Admitting: Neurology

## 2023-11-16 ENCOUNTER — Telehealth: Payer: Self-pay | Admitting: Physician Assistant

## 2023-11-16 ENCOUNTER — Encounter (HOSPITAL_COMMUNITY)

## 2023-11-16 DIAGNOSIS — R9389 Abnormal findings on diagnostic imaging of other specified body structures: Secondary | ICD-10-CM | POA: Insufficient documentation

## 2023-11-16 DIAGNOSIS — R269 Unspecified abnormalities of gait and mobility: Secondary | ICD-10-CM | POA: Insufficient documentation

## 2023-11-16 DIAGNOSIS — R202 Paresthesia of skin: Secondary | ICD-10-CM | POA: Insufficient documentation

## 2023-11-16 DIAGNOSIS — R251 Tremor, unspecified: Secondary | ICD-10-CM | POA: Insufficient documentation

## 2023-11-16 DIAGNOSIS — M4722 Other spondylosis with radiculopathy, cervical region: Secondary | ICD-10-CM | POA: Insufficient documentation

## 2023-11-16 DIAGNOSIS — M542 Cervicalgia: Secondary | ICD-10-CM | POA: Insufficient documentation

## 2023-11-16 DIAGNOSIS — N183 Chronic kidney disease, stage 3 unspecified: Secondary | ICD-10-CM | POA: Insufficient documentation

## 2023-11-16 DIAGNOSIS — M6289 Other specified disorders of muscle: Secondary | ICD-10-CM | POA: Insufficient documentation

## 2023-11-16 DIAGNOSIS — G20C Parkinsonism, unspecified: Secondary | ICD-10-CM | POA: Insufficient documentation

## 2023-11-16 DIAGNOSIS — M79602 Pain in left arm: Secondary | ICD-10-CM | POA: Insufficient documentation

## 2023-11-16 DIAGNOSIS — M79601 Pain in right arm: Secondary | ICD-10-CM | POA: Insufficient documentation

## 2023-11-16 DIAGNOSIS — M439 Deforming dorsopathy, unspecified: Secondary | ICD-10-CM | POA: Insufficient documentation

## 2023-11-16 MED ORDER — IOFLUPANE I 123 185 MBQ/2.5ML IV SOLN
5.0000 | Freq: Once | INTRAVENOUS | Status: DC | PRN
  Filled 2023-11-16: qty 5

## 2023-11-16 NOTE — Telephone Encounter (Signed)
 Paperwork put in [facility].

## 2023-11-16 NOTE — Telephone Encounter (Signed)
 Please see note and advise

## 2023-11-16 NOTE — Telephone Encounter (Signed)
 Pt is having a procedure on 11/18 and has to stop her Wellbutrin  for 8 days., Pt wants to know the process of coming off the medication. Dawna has the paper that pt brought in explaining procedure and drug interactions.

## 2023-11-17 ENCOUNTER — Ambulatory Visit
Admission: RE | Admit: 2023-11-17 | Discharge: 2023-11-17 | Disposition: A | Discharge status: Home / Self Care | Source: Ambulatory Visit | Attending: Neurology | Admitting: Neurology

## 2023-11-17 DIAGNOSIS — R269 Unspecified abnormalities of gait and mobility: Secondary | ICD-10-CM

## 2023-11-17 DIAGNOSIS — M542 Cervicalgia: Secondary | ICD-10-CM

## 2023-11-17 DIAGNOSIS — R209 Unspecified disturbances of skin sensation: Secondary | ICD-10-CM

## 2023-11-17 DIAGNOSIS — M439 Deforming dorsopathy, unspecified: Secondary | ICD-10-CM

## 2023-11-17 DIAGNOSIS — R251 Tremor, unspecified: Secondary | ICD-10-CM

## 2023-11-17 DIAGNOSIS — M4722 Other spondylosis with radiculopathy, cervical region: Secondary | ICD-10-CM

## 2023-11-17 DIAGNOSIS — M6289 Other specified disorders of muscle: Secondary | ICD-10-CM

## 2023-11-17 DIAGNOSIS — M79601 Pain in right arm: Secondary | ICD-10-CM

## 2023-11-17 DIAGNOSIS — G20C Parkinsonism, unspecified: Secondary | ICD-10-CM

## 2023-11-17 DIAGNOSIS — N183 Chronic kidney disease, stage 3 unspecified: Secondary | ICD-10-CM

## 2023-11-17 DIAGNOSIS — R9389 Abnormal findings on diagnostic imaging of other specified body structures: Secondary | ICD-10-CM

## 2023-11-17 DIAGNOSIS — R202 Paresthesia of skin: Secondary | ICD-10-CM

## 2023-11-17 NOTE — Telephone Encounter (Signed)
 I discussed this w/ Dr. Geoffry.  Stop the Wellbutrin  1 week before the procedure. No need to wean.   Resume asap after the test is over.

## 2023-11-17 NOTE — Telephone Encounter (Signed)
 LVM per DPR with recommendations.

## 2023-11-17 NOTE — Telephone Encounter (Signed)
 Pt LVM t@10 :59a requesting status.  Pls call her back and advise.

## 2023-11-18 ENCOUNTER — Telehealth: Payer: Self-pay | Admitting: Physician Assistant

## 2023-11-18 ENCOUNTER — Telehealth: Payer: Self-pay | Admitting: Neurology

## 2023-11-18 NOTE — Telephone Encounter (Signed)
 Pt has called to inform Dr Buck that re: the upcoming DAT scan her psychiatrist has given the ok for her to come off of the buPROPion  (WELLBUTRIN  XL) 300 MG 24 hr tablet so that she can have the DAT scan, she would like a call to discuss.

## 2023-11-18 NOTE — Telephone Encounter (Signed)
 I called pt.  She said that she was not aware that she would be off one of her medications that would effect  the testing results.  Wellbutrin  is one.  She went to have test done and due to being on wellbutrin  she had to reschedule. She was not told when scheduled the test, or when it was ordered by physician.  [facility] to get the sheet that they show to pts about this and he is to fax to me.

## 2023-11-18 NOTE — Telephone Encounter (Signed)
 Pt Lvm@ 9:33a requesting a call back.  She said she needs clarification on your message and she's sorry she missed your earlier phone call.  Next appt 4/14

## 2023-11-18 NOTE — Telephone Encounter (Signed)
 Reviewed information with patient again.

## 2023-11-18 NOTE — Telephone Encounter (Signed)
 Recommendations reviewed with patient again.

## 2023-11-18 NOTE — Telephone Encounter (Signed)
 Pt lvm @ 11 am. Need Dawna to verify information. RTC [phone]

## 2023-11-22 ENCOUNTER — Telehealth: Payer: Self-pay | Admitting: Neurology

## 2023-11-22 DIAGNOSIS — R9389 Abnormal findings on diagnostic imaging of other specified body structures: Secondary | ICD-10-CM

## 2023-11-22 DIAGNOSIS — M542 Cervicalgia: Secondary | ICD-10-CM

## 2023-11-22 DIAGNOSIS — M4802 Spinal stenosis, cervical region: Secondary | ICD-10-CM

## 2023-11-22 DIAGNOSIS — M4722 Other spondylosis with radiculopathy, cervical region: Secondary | ICD-10-CM

## 2023-11-22 DIAGNOSIS — M439 Deforming dorsopathy, unspecified: Secondary | ICD-10-CM

## 2023-11-22 NOTE — Telephone Encounter (Signed)
 Pt called to request MRI Results . Pt states that  she had done Mri  last week and wanted to follow up with MD about results

## 2023-11-22 NOTE — Telephone Encounter (Signed)
 Called pt at [phone]. Relayed results per Dr. Obie note. Pt verbalized understanding and agreeable to plan. She will call back if Neurosurgery does not call to make appt in next 1-2 weeks to let us  know.

## 2023-11-22 NOTE — Addendum Note (Signed)
 Addended by: Anglea Gordner on: 11/22/2023 10:29 AM   Modules accepted: Orders

## 2023-11-22 NOTE — Progress Notes (Signed)
 See phone note 11/22/23

## 2023-11-22 NOTE — Telephone Encounter (Signed)
 Referral for neurosurgery sent through [facility] as requested by neurologist. Phone: [phone], Fax: [phone]

## 2023-11-25 ENCOUNTER — Telehealth: Payer: Self-pay | Admitting: Physician Assistant

## 2023-11-25 NOTE — Telephone Encounter (Signed)
 Please see message from patient. She had to stop Wellbutrin  for a study, needs to be off 8 days.

## 2023-11-25 NOTE — Telephone Encounter (Signed)
 Pt LVM@ 11:27a stating that she has been off wellbutrin  for 3 days and she feels week.  It's difficult to walk and to hold her arms up.  She wonders if it's a side effect from being off the medication.  Next appt 4/14

## 2023-11-25 NOTE — Telephone Encounter (Signed)
 Called patient with information on Wellbutrin . She said sx do get better by lunch. She said she is glad to know it is the medication possibly causing sx and that it isn't all in her head.

## 2023-11-25 NOTE — Telephone Encounter (Signed)
 It's possible the weakness is from not taking the Wellbutrin  because it can give you more energy and motivation.  It should improve over the next few days.  And she can get back on the Wellbutrin  as soon as the DaTscan is over.

## 2023-11-29 NOTE — Telephone Encounter (Signed)
 Patient request referral for neurosurgery sent to [facility] in [place] due to transportation issues.  Could you create a referral due to first one was sent through [facility].

## 2023-11-30 ENCOUNTER — Other Ambulatory Visit (HOSPITAL_COMMUNITY)

## 2023-11-30 ENCOUNTER — Encounter (HOSPITAL_COMMUNITY): Payer: Self-pay

## 2023-11-30 ENCOUNTER — Ambulatory Visit (HOSPITAL_COMMUNITY)
Admission: RE | Admit: 2023-11-30 | Discharge: 2023-11-30 | Disposition: A | Discharge status: Home / Self Care | Source: Ambulatory Visit | Attending: Neurology | Admitting: Neurology

## 2023-11-30 DIAGNOSIS — R9389 Abnormal findings on diagnostic imaging of other specified body structures: Secondary | ICD-10-CM | POA: Insufficient documentation

## 2023-11-30 DIAGNOSIS — R251 Tremor, unspecified: Secondary | ICD-10-CM | POA: Diagnosis present

## 2023-11-30 DIAGNOSIS — M4722 Other spondylosis with radiculopathy, cervical region: Secondary | ICD-10-CM | POA: Insufficient documentation

## 2023-11-30 DIAGNOSIS — M439 Deforming dorsopathy, unspecified: Secondary | ICD-10-CM | POA: Insufficient documentation

## 2023-11-30 DIAGNOSIS — N183 Chronic kidney disease, stage 3 unspecified: Secondary | ICD-10-CM | POA: Insufficient documentation

## 2023-11-30 DIAGNOSIS — M6289 Other specified disorders of muscle: Secondary | ICD-10-CM | POA: Diagnosis not present

## 2023-11-30 DIAGNOSIS — R202 Paresthesia of skin: Secondary | ICD-10-CM | POA: Insufficient documentation

## 2023-11-30 DIAGNOSIS — M79602 Pain in left arm: Secondary | ICD-10-CM | POA: Diagnosis not present

## 2023-11-30 DIAGNOSIS — M542 Cervicalgia: Secondary | ICD-10-CM | POA: Insufficient documentation

## 2023-11-30 DIAGNOSIS — G20C Parkinsonism, unspecified: Secondary | ICD-10-CM | POA: Diagnosis not present

## 2023-11-30 DIAGNOSIS — M79601 Pain in right arm: Secondary | ICD-10-CM | POA: Diagnosis not present

## 2023-11-30 DIAGNOSIS — R269 Unspecified abnormalities of gait and mobility: Secondary | ICD-10-CM | POA: Diagnosis not present

## 2023-11-30 MED ORDER — IOFLUPANE I 123 185 MBQ/2.5ML IV SOLN
5.0000 | Freq: Once | INTRAVENOUS | Status: AC
  Administered 2023-11-30: 4.4 via INTRAVENOUS
  Filled 2023-11-30: qty 5

## 2023-11-30 NOTE — Telephone Encounter (Signed)
Referral placed to neurosurgery

## 2023-11-30 NOTE — Telephone Encounter (Signed)
Referral for neurosurgery fax to [facility]. Phone: [phone], Fax: [phone]

## 2023-12-13 ENCOUNTER — Telehealth: Payer: Self-pay | Admitting: Physician Assistant

## 2023-12-13 NOTE — Telephone Encounter (Signed)
 Pt left vm asking to speak with someone about changing her klonopin  script. She's under a lot of stress lately and thinks she needs an extra dose.

## 2023-12-14 NOTE — Telephone Encounter (Signed)
 Pt seen 10/15:  Continue Wellbutrin  XL 300 mg, 1 p.o. every morning. Continue Klonopin  0.5 mg, 1 p.o. at 8 AM, 1 p.o. at 12:30 PM, 2 p.o. at 8 PM, all as needed. Continue Prozac  40 mg, 2 p.o. daily. Continue Lamictal  150 mg, 2 p.o. nightly. Continue Geodon  40 mg, 1 p.o. every morning and 1 p.o. at lunch. Continue Geodon  80 mg, 2 p.o. nightly. Continue vitamins as per med list. Return in 6 months.    She is asking for another Klonopin  added due to caregiver stress. Will pend as appropriate. She said she is not taking extra, but feels she needs it during the day LF 10/29, #360 for 90 days.    I asked about her scan and she said it was negative. She is to FU with neurologist about Geodon  possibly causing tremors.

## 2023-12-14 NOTE — Telephone Encounter (Signed)
 Pt called to request to speak to MD , Pt stated that she has seen  Neurosurgeon yesterday and he is  requested for Pt to follow up with Neurologist  asap. I tried to ask Pt If she can go into details about what Neurosurgeon stated the patient stated her daughter has more information if needed

## 2023-12-15 NOTE — Telephone Encounter (Signed)
 She can increase by 1 extra Klonopin  0.5 mg, per day. Try her best not to take the extra if at all possible.  Discuss tolerance with her.  Work on her hobbies, take a walk, anything to help her cope with the anxiety.    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 She hasn't wanted to treat w/ Ingrezza  or Austedo. Maybe she'll agree to it now, if her neurologist recommends either of those or another treatment.

## 2023-12-15 NOTE — Telephone Encounter (Signed)
 Neurosurgery notes received and placed in Dr Obie office for review.

## 2023-12-16 NOTE — Telephone Encounter (Signed)
Patient notified of recommendation.

## 2023-12-16 NOTE — Telephone Encounter (Signed)
 I have reviewed neurosurgery records from Dr. Dorn Glade.  Patient was seen on 12/13/2023.  He recommended follow-up for parkinsonism before proceeding with possible neck surgery.  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  Please advise patient that I have unfortunately no other recommendations regarding her parkinsonism and she will have to follow-up with her psychiatrist and other providers.  She is advised to follow-up with neurosurgery to discuss possible surgical options of her cervical spinal stenosis.  Further evaluation for cervical spine related issues such as issues with her spinal cord will be deferred to neurosurgery.

## 2023-12-17 ENCOUNTER — Telehealth: Payer: Self-pay | Admitting: Neurology

## 2023-12-17 NOTE — Telephone Encounter (Signed)
 Pt requested an appointment to f/u to DAT scan

## 2023-12-20 NOTE — Telephone Encounter (Signed)
 Hi Dr. Darnella: Yes, drug-induced parkinsonism is definitely possible and we discussed this extensively during our visit.  She would have to follow-up with her psychiatry provider as far as management.

## 2023-12-20 NOTE — Telephone Encounter (Signed)
 I called pt relayed note from Dr. Buck.  She said she will follow fu with Dr. Darnella re: cervical options.  Notified about parkinsonism relating suspects drug induced. See psychiatry or other providers. Nothing more to offer.  I sent message to Dr. Darnella.  I cancelled the appt in Jnauary to f/u datscan  per patient.  Pt appreciated call back .

## 2023-12-21 ENCOUNTER — Ambulatory Visit: Admitting: Physician Assistant

## 2024-01-05 ENCOUNTER — Other Ambulatory Visit: Payer: Self-pay | Admitting: Physician Assistant

## 2024-01-20 ENCOUNTER — Telehealth: Payer: Self-pay | Admitting: Physician Assistant

## 2024-01-20 NOTE — Telephone Encounter (Signed)
 Pt needs rf on all [place]te. Meds   [facility] in [place]

## 2024-01-21 NOTE — Telephone Encounter (Signed)
 Pt should have 1 more 90 day refill on file for all her maintenance medications to fill.

## 2024-01-21 NOTE — Telephone Encounter (Signed)
 Will review and send if appropriate

## 2024-01-26 ENCOUNTER — Other Ambulatory Visit: Payer: Self-pay

## 2024-01-26 DIAGNOSIS — M48061 Spinal stenosis, lumbar region without neurogenic claudication: Secondary | ICD-10-CM

## 2024-02-02 ENCOUNTER — Telehealth: Payer: Self-pay | Admitting: Podiatry

## 2024-02-02 NOTE — Telephone Encounter (Signed)
 Patient called in for  an Ingrown nail on R great toe; very sore -Dr. Joya patient (no availability for several wks)-Patient elected to schedule an extended hours visit to have the nail looked at. Requested to ask Dr. Sikora about a numbing spray & anxiety medication;

## 2024-02-03 ENCOUNTER — Other Ambulatory Visit: Payer: Self-pay | Admitting: Podiatry

## 2024-02-03 ENCOUNTER — Other Ambulatory Visit: Payer: Self-pay | Admitting: Lab

## 2024-02-03 ENCOUNTER — Ambulatory Visit: Admitting: Podiatry

## 2024-02-03 ENCOUNTER — Encounter: Payer: Self-pay | Admitting: Podiatry

## 2024-02-03 ENCOUNTER — Telehealth: Payer: Self-pay | Admitting: Lab

## 2024-02-03 DIAGNOSIS — L600 Ingrowing nail: Secondary | ICD-10-CM | POA: Diagnosis not present

## 2024-02-03 MED ORDER — DIAZEPAM 10 MG PO TABS: 10.0000 mg | ORAL_TABLET | Freq: Two times a day (BID) | ORAL | 0 refills | Status: DC | PRN

## 2024-02-03 MED ORDER — DIAZEPAM 10 MG PO TABS: 10.0000 mg | ORAL_TABLET | Freq: Two times a day (BID) | ORAL | 0 refills | Status: AC | PRN

## 2024-02-03 NOTE — Patient Instructions (Signed)

## 2024-02-03 NOTE — Telephone Encounter (Signed)
 Patient calling about anxiety medication needing to be called in prior to her procedure please advise.

## 2024-02-03 NOTE — Telephone Encounter (Signed)
 I called and verified the appt with the patient. They have only been seen in the [place] office and they are NOT willing to travel to [facility]. They do not have their own transportation and cannot make it that far away from their home.  They understand they need to be seen by Dr. Sikora for the anxiety medication she requested and will set up a follow-up appt to be seen by her following the appt she has today for the ingrown nail. Patient said the pain is unbearable and they need to have it taken care of today.   *Used extended hours due to  1.no personal transport 2.only has a ride after 4 pm Thursdays & Fridays 3. Emergency/ acute pain (Ingrown nail)

## 2024-02-07 ENCOUNTER — Other Ambulatory Visit

## 2024-02-08 NOTE — Progress Notes (Signed)
 Subjective:   Patient ID: Grayce JAYSON Sar, female   DOB: 74 y.o.   MRN: [id number]   HPI Patient presents with a painful ingrown toenail right big toe stating that both sides have hurt and that she has tried to trim and soak it without relief of symptoms   ROS      Objective:  Physical Exam  Neurovascular status intact incurvated right hallux nail medial lateral borders with no active drainage or redness noted currently except for structural damage to the nail     Assessment:  Ingrown toenail deformity right hallux medial lateral borders with pain     Plan:  H&P reviewed recommended correction explained procedure risk patient wants surgery and today I infiltrated the right big toe 60 mg like Marcaine  mixture sterile prep done using sterile instrumentation removed borders exposed matrix applied phenol 3 applications 30 seconds followed by alcohol lavage sterile dressing gave instructions on soaks wear dressing 24 hours take it off earlier if throbbing were to occur and encouraged to call questions concerns

## 2024-02-09 ENCOUNTER — Ambulatory Visit: Admitting: Neurology

## 2024-02-18 ENCOUNTER — Other Ambulatory Visit

## 2024-02-23 ENCOUNTER — Other Ambulatory Visit

## 2024-04-25 ENCOUNTER — Telehealth: Admitting: Physician Assistant
